# Patient Record
Sex: Female | Born: 1980 | Race: Black or African American | Hispanic: No | Marital: Single | Smoking: Never smoker
Health system: Southern US, Community
[De-identification: ages and names within clinical notes are randomized; demographics above are authoritative.]

## PROBLEM LIST (undated history)

## (undated) DIAGNOSIS — G4730 Sleep apnea, unspecified: Secondary | ICD-10-CM

## (undated) DIAGNOSIS — Z9889 Other specified postprocedural states: Secondary | ICD-10-CM

## (undated) DIAGNOSIS — I10 Essential (primary) hypertension: Secondary | ICD-10-CM

## (undated) DIAGNOSIS — E119 Type 2 diabetes mellitus without complications: Secondary | ICD-10-CM

## (undated) DIAGNOSIS — A599 Trichomoniasis, unspecified: Secondary | ICD-10-CM

## (undated) DIAGNOSIS — K219 Gastro-esophageal reflux disease without esophagitis: Secondary | ICD-10-CM

## (undated) DIAGNOSIS — D649 Anemia, unspecified: Secondary | ICD-10-CM

## (undated) DIAGNOSIS — F411 Generalized anxiety disorder: Secondary | ICD-10-CM

## (undated) DIAGNOSIS — F32A Depression, unspecified: Secondary | ICD-10-CM

## (undated) DIAGNOSIS — F329 Major depressive disorder, single episode, unspecified: Secondary | ICD-10-CM

## (undated) HISTORY — DX: Depression, unspecified: F32.A

## (undated) HISTORY — DX: Anemia, unspecified: D64.9

## (undated) HISTORY — DX: Type 2 diabetes mellitus without complications: E11.9

## (undated) HISTORY — DX: Major depressive disorder, single episode, unspecified: F32.9

## (undated) HISTORY — PX: ABLATION: SHX5711

## (undated) HISTORY — DX: Generalized anxiety disorder: F41.1

## (undated) HISTORY — DX: Trichomoniasis, unspecified: A59.9

---

## 2003-10-30 ENCOUNTER — Emergency Department (HOSPITAL_COMMUNITY): Admission: EM | Admit: 2003-10-30 | Discharge: 2003-10-30 | Payer: Self-pay | Admitting: Emergency Medicine

## 2003-12-31 ENCOUNTER — Ambulatory Visit (HOSPITAL_COMMUNITY): Admission: RE | Admit: 2003-12-31 | Discharge: 2003-12-31 | Payer: Self-pay | Admitting: *Deleted

## 2004-02-20 ENCOUNTER — Ambulatory Visit (HOSPITAL_COMMUNITY): Admission: RE | Admit: 2004-02-20 | Discharge: 2004-02-20 | Payer: Self-pay | Admitting: Obstetrics and Gynecology

## 2004-05-26 ENCOUNTER — Inpatient Hospital Stay (HOSPITAL_COMMUNITY): Admission: AD | Admit: 2004-05-26 | Discharge: 2004-05-29 | Payer: Self-pay | Admitting: Obstetrics and Gynecology

## 2004-05-30 ENCOUNTER — Emergency Department (HOSPITAL_COMMUNITY): Admission: EM | Admit: 2004-05-30 | Discharge: 2004-05-30 | Payer: Self-pay | Admitting: Emergency Medicine

## 2005-07-11 IMAGING — US US OB FOLLOW-UP
1 series · 13 of 28 positions shown · non-contrast
Comparison: none

CLINICAL DATA: Incomplete visualization of spinal cord on previous exam.

[Series 1: unknown · 0.32mm/px · 13 of 50 slices shown]
[im 2/50]
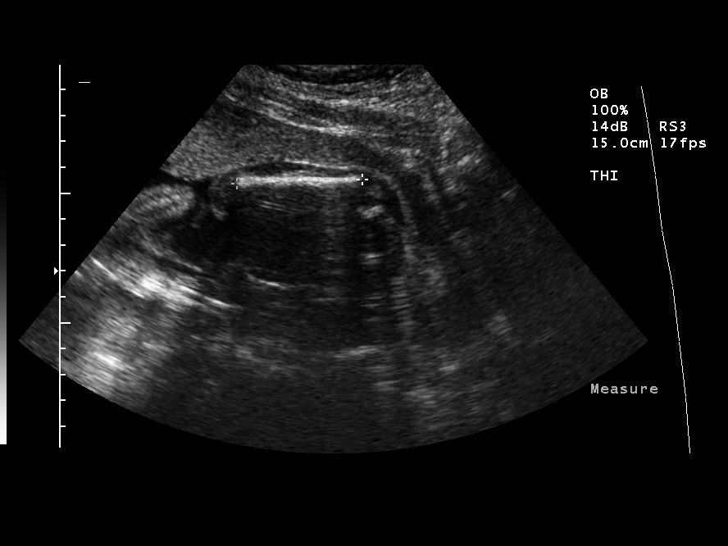
[im 6/50]
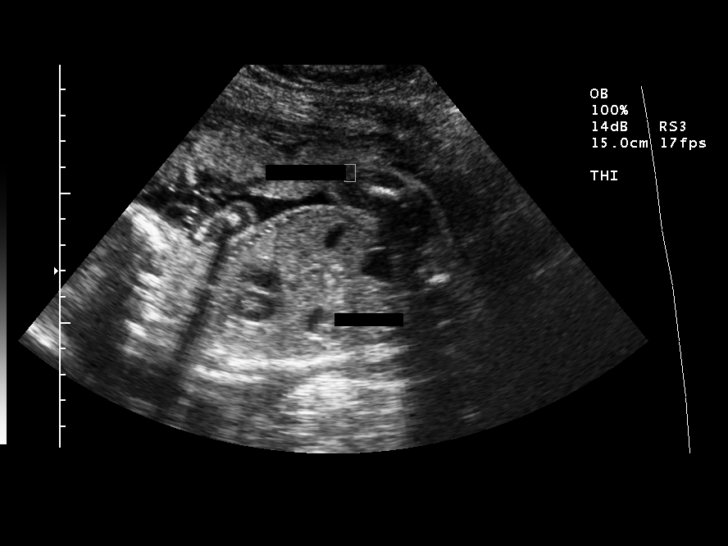
[im 10/50]
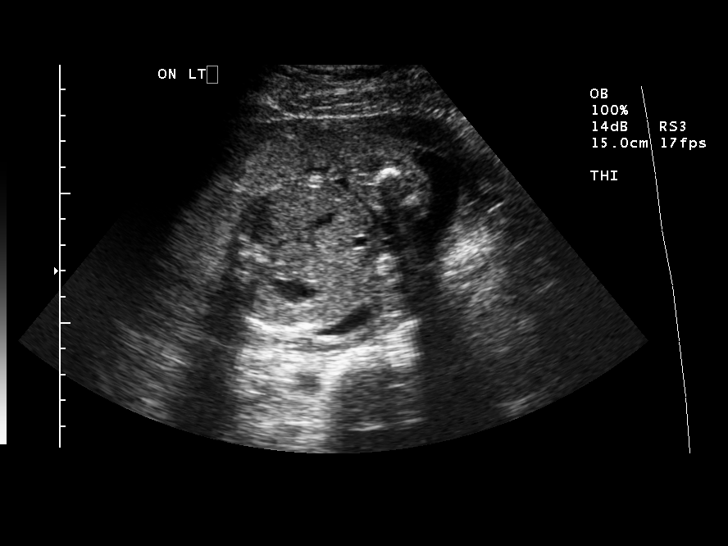
[im 13/50]
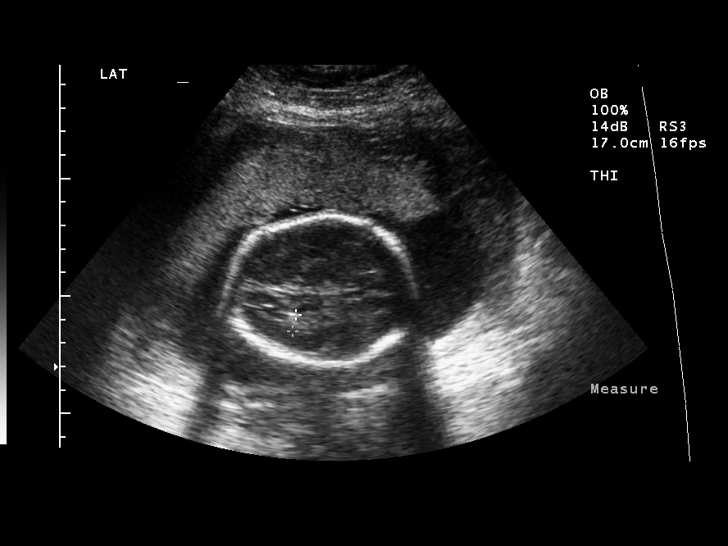
[im 17/50]
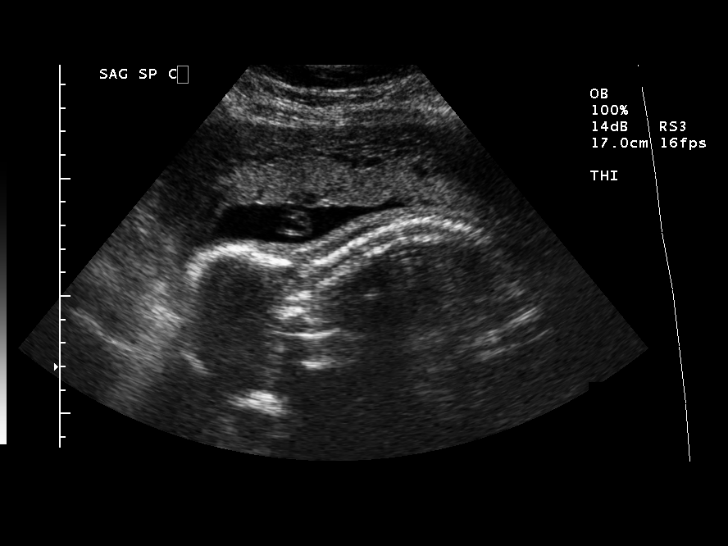
[im 20/50]
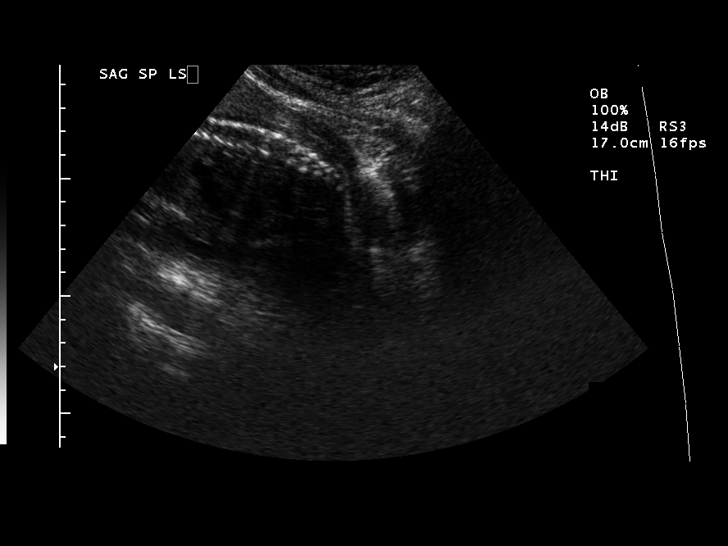
[im 26/50]
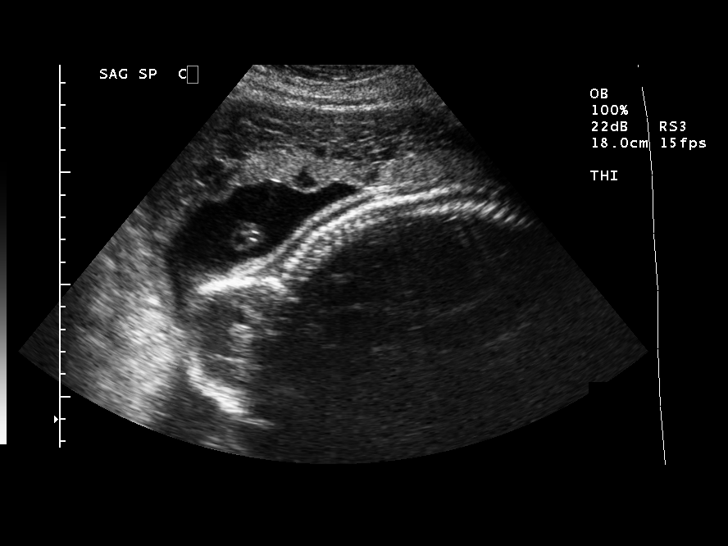
[im 30/50]
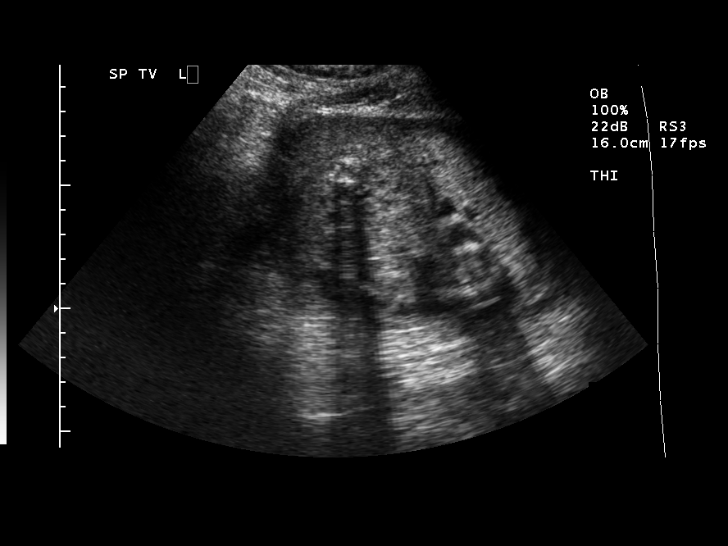
[im 33/50]
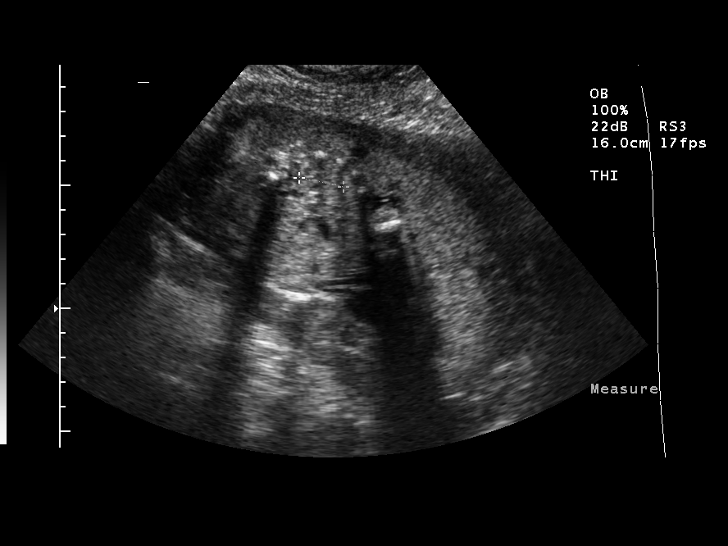
[im 37/50]
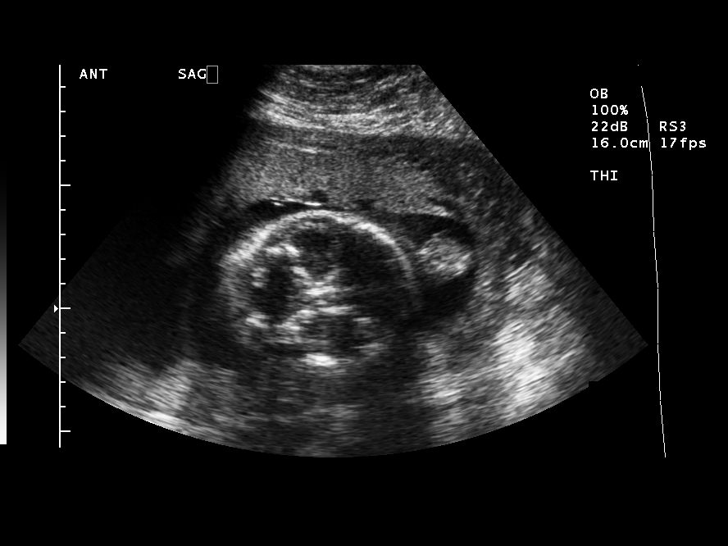
[im 40/50]
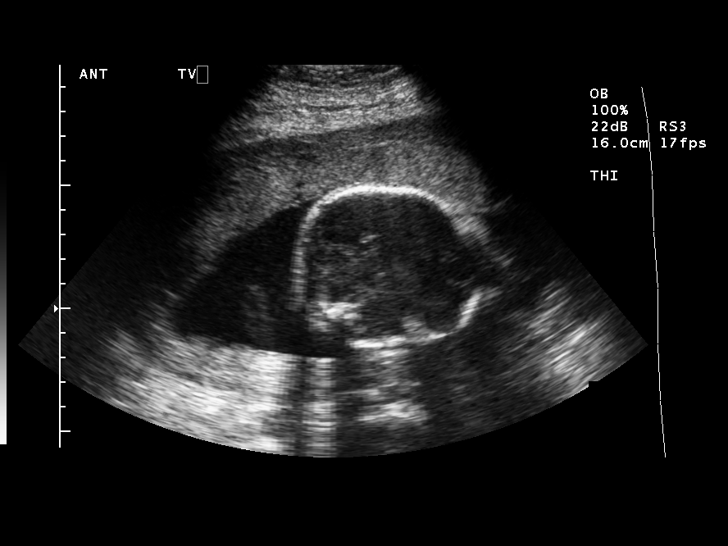
[im 44/50]
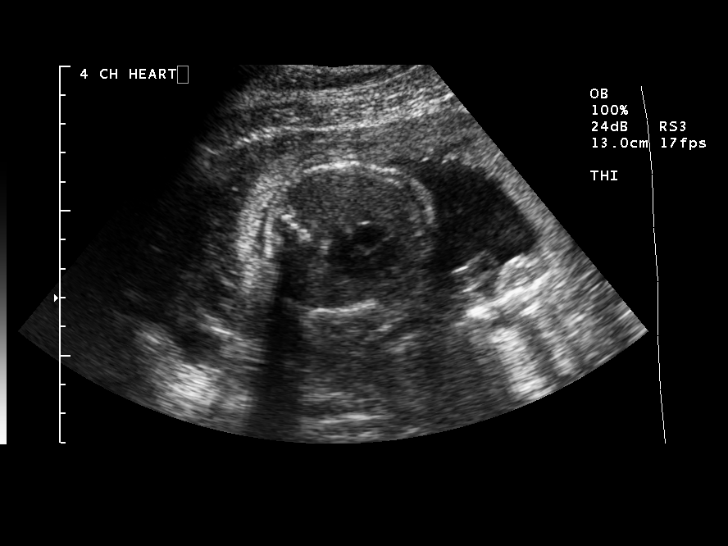
[im 48/50]
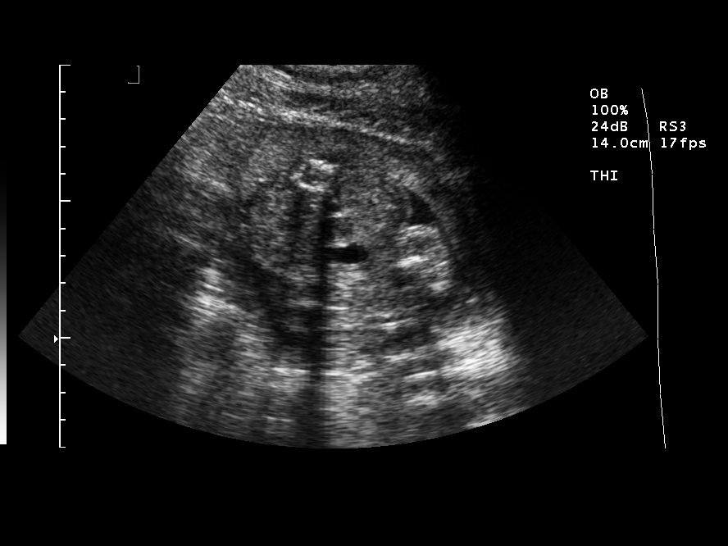

[13 of 28 positions shown; findings below may reference images not displayed]

OBSTETRICAL ULTRASOUND RE-EVALUATION
 Number of Fetuses:  Single
 Heart Rate:  155 beats per minute 
 Movement:  Yes
 Breathing:  No
 Presentation:  Breech
 Placental Location:  Anterior
 Grade:  I
 Previa:  No
 Amniotic Fluid (subjective):  Normal
 Amniotic Fluid (objective):  9.7 which is 5%ile and 72.1 which is 95%ile

 FETAL BIOMETRY
 BPD:  6.4 cm, 26 weeks 0 days
 HC:  23.7 cm, 25 weeks 5 days
 AC:  20.7 cm, 25 weeks 2 days
 FL:  4.8 cm, 25 weeks 6 days

 Mean GA:  25 weeks 5 days

 FETAL ANATOMY
 Lateral Ventricles:  Visualized (Previously seen)
 Thalami/CSP:  Previously seen 
 Posterior Fossa:  Previously seen 
 Nuchal Region:  Previously seen 
 Spine:  Visualized 
 4 Chamber Heart on Left:  Visualized (Previously seen)
 Stomach on Left:  Visualized (Previously seen)
 3 Vessel Cord:  Visualized (Previously seen)
 Cord Insertion Site:  Visualized (Previously seen)
 Kidneys:    Visualized (Previously seen)
 Bladder:  Visualized (Previously seen)
 Extremities:  Visualized (Previously seen)

 ADDITIONAL ANATOMY VISUALIZED:  Male genitalia

 MATERNAL FINDINGS
 Cervix:  3.6 cm
IMPRESSION: Single live intrauterine gestation measured at 25 weeks 5 days estimated gestational age.  This represents appropriate interval growth since previous ultrasound of 12/31/03.  Spine is now well visualized and normal appearance.  No morphologic abnormality seen.  Currently breech presentation.

## 2005-09-07 ENCOUNTER — Other Ambulatory Visit: Admission: RE | Admit: 2005-09-07 | Discharge: 2005-09-07 | Payer: Self-pay | Admitting: Obstetrics & Gynecology

## 2006-01-27 ENCOUNTER — Ambulatory Visit (HOSPITAL_COMMUNITY): Admission: RE | Admit: 2006-01-27 | Discharge: 2006-01-27 | Payer: Self-pay | Admitting: Family Medicine

## 2006-06-08 ENCOUNTER — Emergency Department (HOSPITAL_COMMUNITY): Admission: EM | Admit: 2006-06-08 | Discharge: 2006-06-08 | Payer: Self-pay | Admitting: Emergency Medicine

## 2006-10-17 ENCOUNTER — Ambulatory Visit: Admission: RE | Admit: 2006-10-17 | Discharge: 2006-10-17 | Payer: Self-pay | Admitting: Obstetrics & Gynecology

## 2006-11-02 ENCOUNTER — Ambulatory Visit: Payer: Self-pay | Admitting: Pulmonary Disease

## 2006-12-01 ENCOUNTER — Ambulatory Visit (HOSPITAL_COMMUNITY): Admission: RE | Admit: 2006-12-01 | Discharge: 2006-12-01 | Payer: Self-pay | Admitting: Obstetrics & Gynecology

## 2007-04-19 ENCOUNTER — Emergency Department (HOSPITAL_COMMUNITY): Admission: EM | Admit: 2007-04-19 | Discharge: 2007-04-20 | Payer: Self-pay | Admitting: Emergency Medicine

## 2007-06-18 IMAGING — CR DG CHEST 2V
2 series · 2 of 2 positions shown · non-contrast
Comparison: none

CLINICAL DATA: Shortness of breath, hypertension. 
 CHEST - 2 VIEW ? 01/27/06:

[view not recorded (1 of 2)]
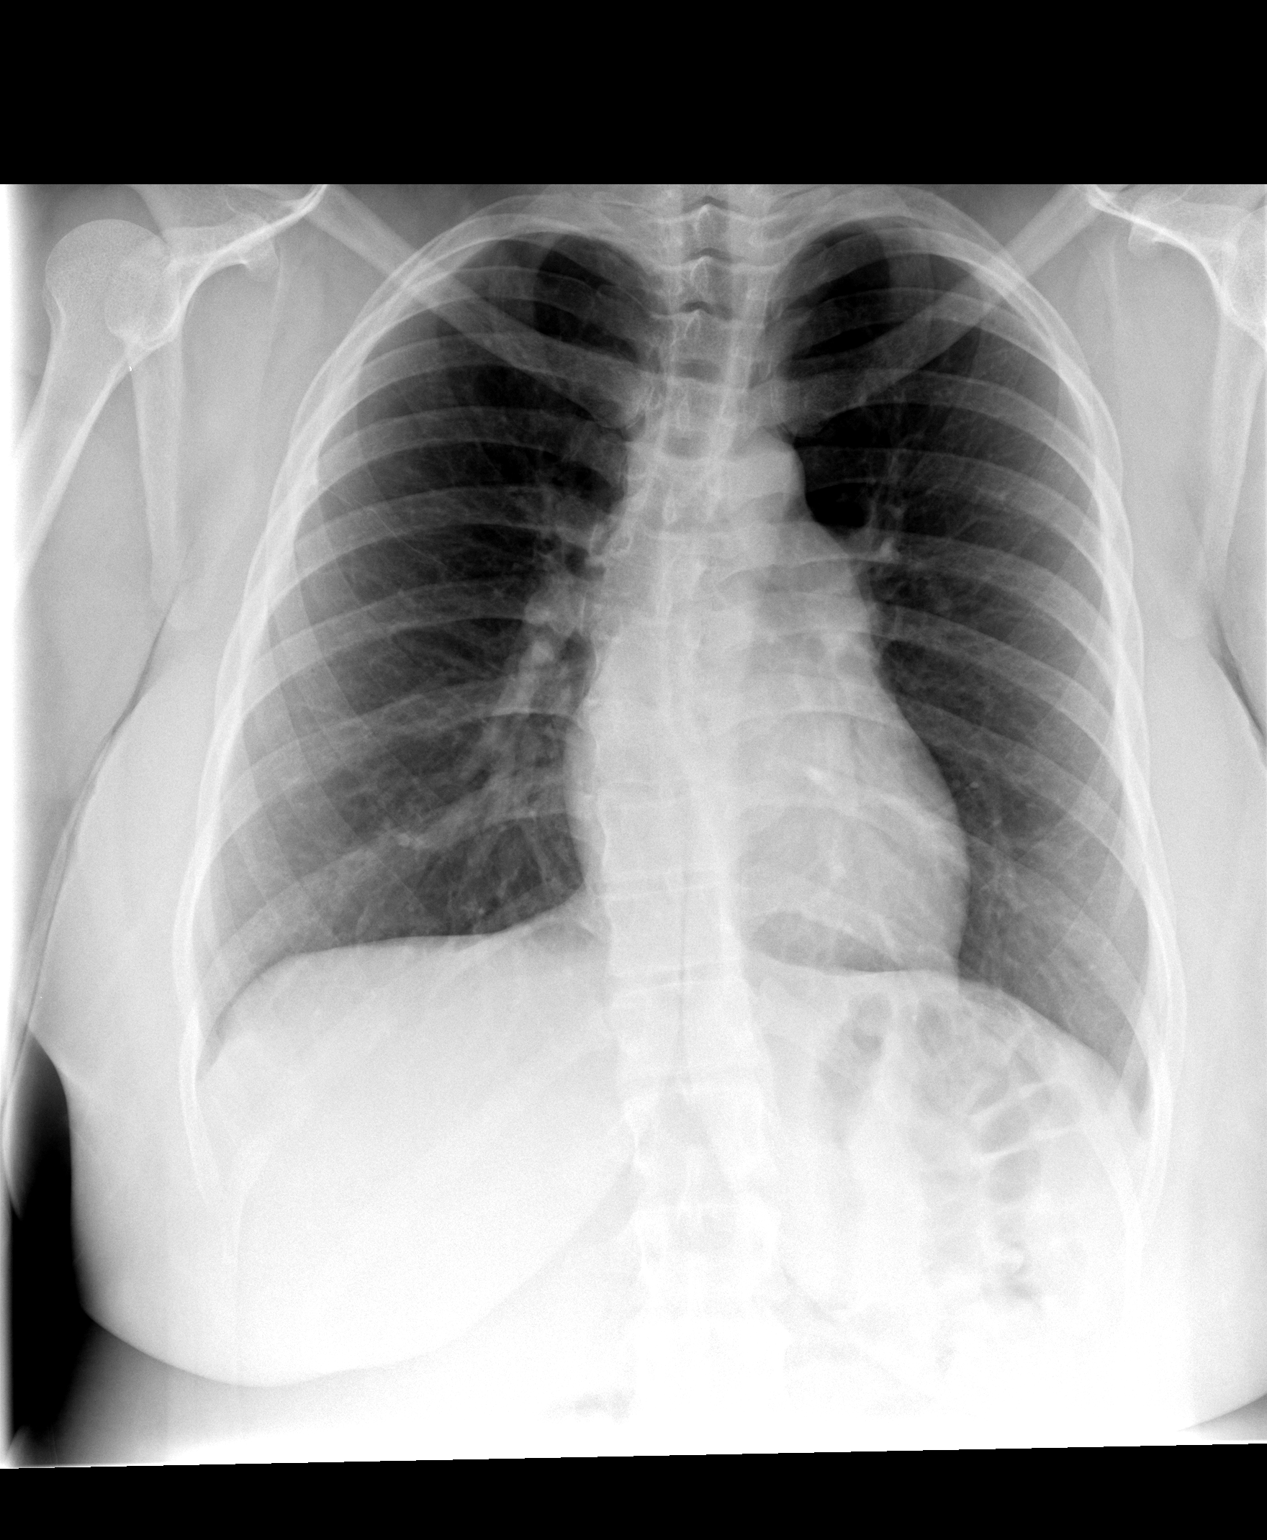

[view not recorded (2 of 2)]
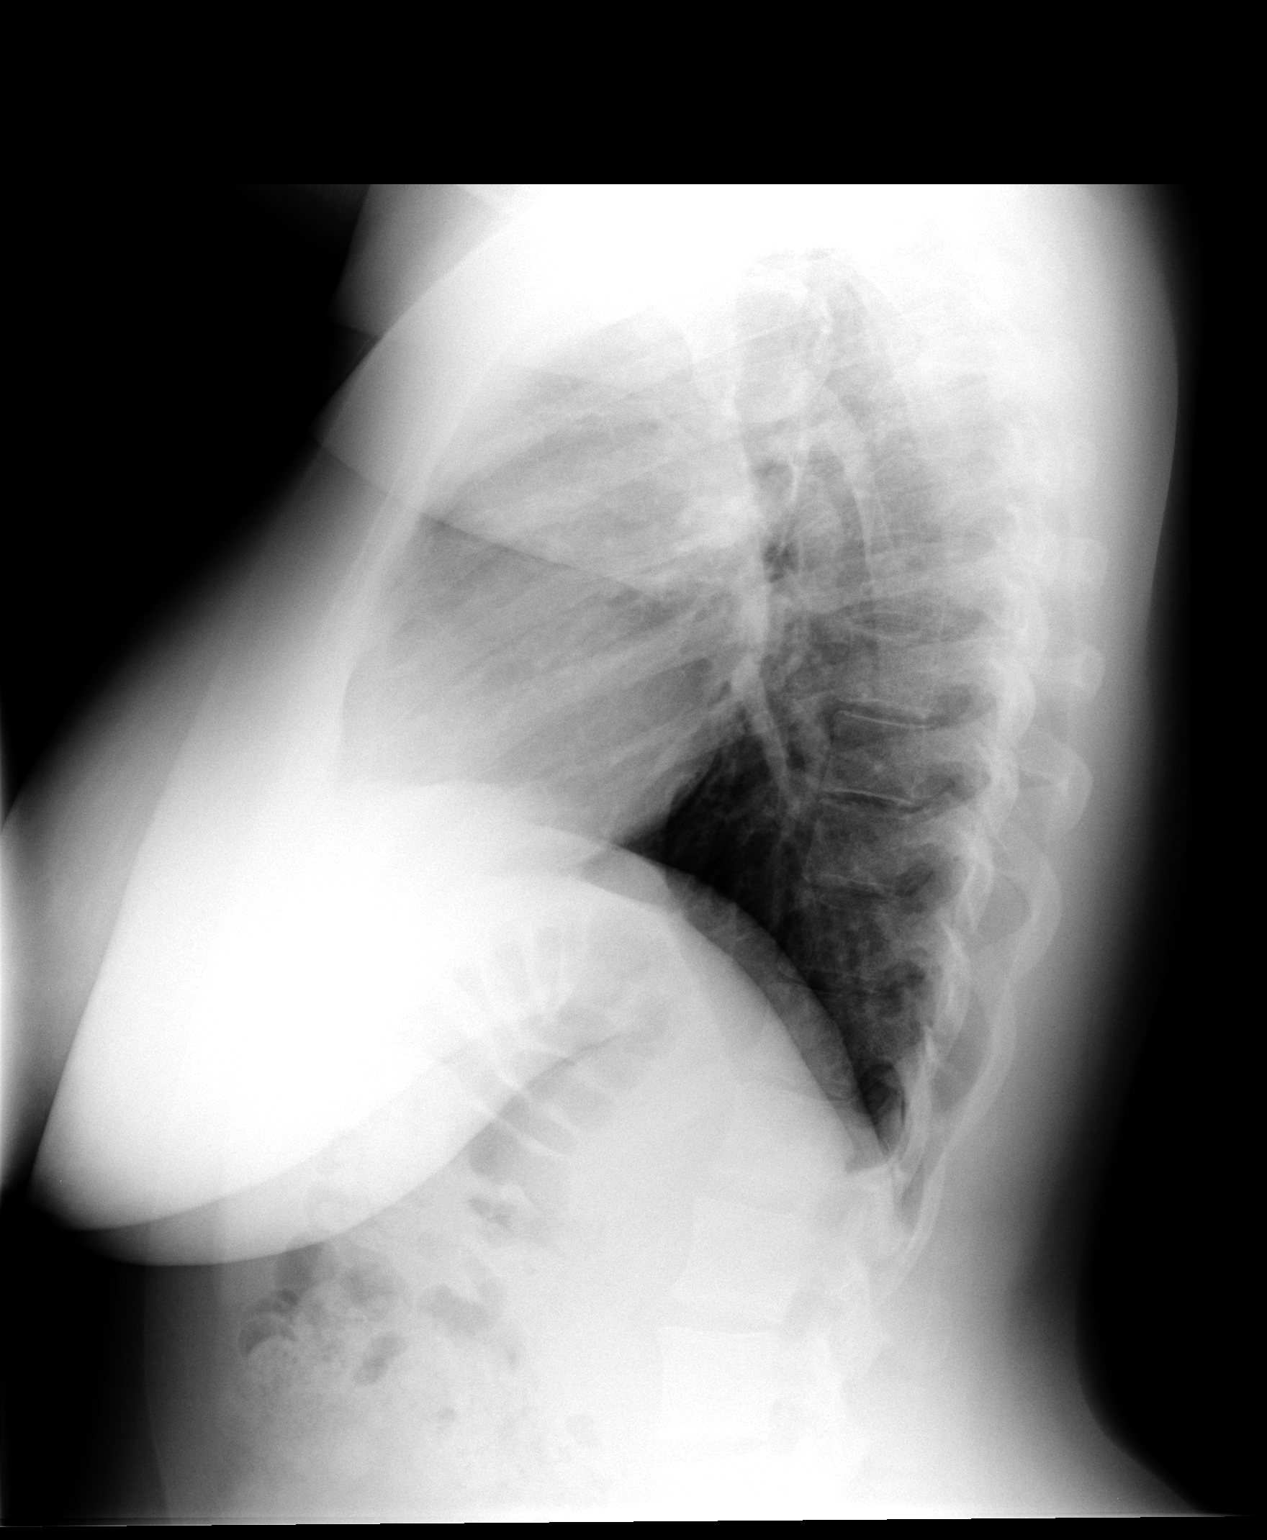

[2 of 2 positions shown; findings below may reference images not displayed]

FINDINGS: The lungs are clear.   There is no effusion. Heart size is normal.  No focal bony abnormality.  Some mild thoracolumbar scoliosis is noted.
IMPRESSION: No acute disease.

## 2007-09-19 ENCOUNTER — Emergency Department (HOSPITAL_COMMUNITY): Admission: EM | Admit: 2007-09-19 | Discharge: 2007-09-19 | Payer: Self-pay | Admitting: Emergency Medicine

## 2008-05-08 ENCOUNTER — Ambulatory Visit (HOSPITAL_BASED_OUTPATIENT_CLINIC_OR_DEPARTMENT_OTHER): Admission: RE | Admit: 2008-05-08 | Discharge: 2008-05-08 | Payer: Self-pay | Admitting: *Deleted

## 2009-05-17 ENCOUNTER — Emergency Department (HOSPITAL_COMMUNITY): Admission: EM | Admit: 2009-05-17 | Discharge: 2009-05-17 | Payer: Self-pay | Admitting: Emergency Medicine

## 2010-03-21 ENCOUNTER — Emergency Department (HOSPITAL_COMMUNITY): Admission: EM | Admit: 2010-03-21 | Discharge: 2010-03-21 | Payer: Self-pay | Admitting: Emergency Medicine

## 2011-01-27 ENCOUNTER — Emergency Department (HOSPITAL_COMMUNITY)
Admission: EM | Admit: 2011-01-27 | Discharge: 2011-01-27 | Disposition: A | Discharge status: Home / Self Care | Payer: Medicaid Other | Attending: Emergency Medicine | Admitting: Emergency Medicine

## 2011-01-27 DIAGNOSIS — R070 Pain in throat: Secondary | ICD-10-CM | POA: Insufficient documentation

## 2011-01-27 DIAGNOSIS — J039 Acute tonsillitis, unspecified: Secondary | ICD-10-CM | POA: Insufficient documentation

## 2011-04-07 NOTE — Op Note (Signed)
Debra Tanner, Debra Tanner               ACCOUNT NO.:  [id number]   MEDICAL RECORD NO.:  [id number]          PATIENT TYPE:  AMB   LOCATION:  DSC                          FACILITY:  [facility]   PHYSICIAN:  Viann Shove, MDDATE OF BIRTH:  [birth year]   DATE OF PROCEDURE:  05/08/2008  DATE OF DISCHARGE:                               OPERATIVE REPORT   PREOPERATIVE DIAGNOSIS:  Right esotropia.   POSTOPERATIVE DIAGNOSIS:  Right esotropia.   PROCEDURE:  5.5-mm right medial rectus recession.   SURGEON:  Viann Shove, MD   ANESTHESIA:  General with laryngeal mass.   COMPLICATIONS:  None.   PROCEDURE:  After adequate general anesthesia was achieved, the patient  was prepped and draped in the usual sterile ophthalmic manner.  A lid  speculum was placed between the lids of the right eye.  Forced ductions  were carried out, which were negative. An incision was made through  conjunctiva and Tenon's capsule at 1 o'clock at the limbus and extended  supranasally.  A limbal peritomy was carried out clockwise between 1  o'clock and 5 o'clock.  The 5 o'clock incision was extended  infranasally.  The right medial rectus muscle was isolated on muscle.  Check ligaments and intermuscular septum were divided from the muscle.  A 6-0 double-arm Vicryl suture was passed through the muscle at its  insertion, and locked at each end.  The muscle was cut away from globe  at its insertion and bleeding episcleral vessels cauterized.  The muscle  was recessed 5.5 mm posterior to the original insertion.  The sutures  were passed through scleral tunnels at that point, and the muscle tied  at that point with a surgeon's knot.  Conjunctiva was closed at the  limbus using interrupted 7-0 chromic sutures.  The lid speculum was  removed from the right eye.  Bacitracin ointment was placed between lids  of the right eye.  A semi-pressure dressing was applied to the right  eye.  The patient was awaken and taken to  the recovery room in good  condition.      Viann Shove, MD  Electronically Signed     WGM/MEDQ  D:  05/08/2008  T:  05/09/2008  Job:  [id number]

## 2011-04-10 NOTE — Procedures (Signed)
Debra Tanner, Debra Tanner               ACCOUNT NO.:  [id number]   MEDICAL RECORD NO.:  [id number]          PATIENT TYPE:  OUT   LOCATION:  SLEEP LAB                     FACILITY:  [facility]   PHYSICIAN:  Barbaraann Share, MD,FCCPDATE OF BIRTH:  06[birth year]   DATE OF STUDY:  10/17/2006                            NOCTURNAL POLYSOMNOGRAM   REFERRING PHYSICIAN:  Duane Lope MD   INDICATION FOR THE STUDY:  Hypersomnia with sleep apnea.   EPWORTH SCORE:  11.   SLEEP ARCHITECTURE:  The patient had a total sleep time of 342 minutes  with adequate slow wave sleep as well as REM.  Sleep onset latency was  normal and REM onset was very prolonged at 147 minutes.  Sleep  efficiency was mildly decreased at 84%.   RESPIRATORY DATA:  The patient underwent split night protocol where  there was found 154 obstructive events in the first 107 minutes of  sleep.  This gave the patient a respiratory disturbance index of 86  events per hour over the first half of the night.  The events were not  positional, but there was very loud snoring noted throughout.  By  protocol, the patient was then placed on a Respironics small/medium  comfort light nasal mask and ultimately titrated to a final pressure of  8 cm of water pressure with excellent control of events, even through  supine REM.   OXYGEN DATA:  The patient had O2 desaturation as low as 79% with her  obstructive events.   CARDIAC DATA:  No clinically significant cardiac arrhythmias were noted.   MOVEMENT/PARASOMNIA:  Small numbers of leg jerks that were not  clinically significant.   IMPRESSION/RECOMMENDATION:  Split night study reveals severe obstructive  sleep apnea/hypopnea syndrome with a respiratory disturbance index of 86  events per hour during the first half of the night and O2 desaturation  as low as 79%.  The patient was then placed on CPAP with a Respironics  small/medium comfort light nasal mask and titrated to a final pressure  of 8 cm of  water.  The patient should also be encouraged to work on  weight loss if clinically indicated.     Barbaraann Share, MD,FCCP  Diplomate, American Board of Sleep  Medicine    [facility]/MEDQ  D:  11/02/2006 14:23:21  T:  11/02/2006 16:01:15  Job:  [id number]

## 2011-04-10 NOTE — H&P (Signed)
Debra Tanner, GUPTA                          ACCOUNT NO.:  [id number]   MEDICAL RECORD NO.:  [id number]                  PATIENT TYPE:   LOCATION:                                       FACILITY:   PHYSICIAN:  Tilda Burrow, M.D.              DATE OF BIRTH:  [birth year]   DATE OF ADMISSION:  05/26/2004  DATE OF DISCHARGE:                                HISTORY & PHYSICAL   ADMISSION DIAGNOSES:  1. Pregnancy at 39 weeks and 1 day.  2. Elective induction of labor.  3. Cervical favorability.  4. Small pelvis with borderline pelvic diameters.   HISTORY OF PRESENT ILLNESS:  This is a 30 year old female, 4 feet 11 inches,  gravida 2, para 0, AB 1, LMP August 26, 2003, placing [facility] July 10,with  corresponding second trimester ultrasound.  She is admitted at 39 weeks for  induction of labor. She has been contracting off and on for the past few  days when seen May 19, 2004.  Her cervix is 1+ cm, soft, -2, vertex  presentation.  The patient has rather small bone structure, and the  presenting part is not at the end of the pelvis while the cervix is  cooperating, tissues are softening.  Possibility of bony dystocia is an  option.  Fundal height has remained at 35 cm last three visits, so induction  is indicated, optimizes chances at trial of labor.  She has been made aware  of the possibility of cesarean delivery is higher in someone of her stature.   PAST MEDICAL HISTORY:  Benign.   PAST SURGICAL HISTORY:  Negative.   ALLERGIES:  No known drug allergies.   SOCIAL HISTORY:  Stable relationship, current partner x 6 years.   FAMILY HISTORY:  Positive for hypertension.   PRENATAL COURSE:  Blood type A positive.  GC and chlamydia both positive  early pregnancy with repeat GC and chlamydia negative in January.  Repeat GC  and chlamydia negative April 28, 2004.  Group B strep, however, was positive.  Prenatal labs include RPR nonreactive.  HIV negative.  Hepatitis negative.  Rubella  immune is present.  Hemoglobin 12, hematocrit 38.  Antibody screen  negative.   PHYSICAL EXAMINATION:  Height 4 feet 11 inches, weight 164 pounds.  Blood  pressure 118/82.  Urinalysis negative for protein.  Fundal height 35 cm,  vertex presentation, cervix 1+ cm, soft, -2, vertex.   PLAN:  Dilation Monday night, July 4, with Pitocin and probable trial of  labor.  Uncertain prognosis for vaginal delivery.     ___________________________________________                                         Tilda Burrow, M.D.   JVF/MEDQ  D:  05/19/2004  T:  05/19/2004  Job:  [id number]  cc:   Francoise Schaumann. Halm, D.O.  [address]  [place]  [place] [address]  Fax: [phone]

## 2011-04-10 NOTE — Op Note (Signed)
Debra Tanner, Debra Tanner                         ACCOUNT NO.:  [id number]   MEDICAL RECORD NO.:  [id number]                   PATIENT TYPE:  INP   LOCATION:  LDR2                                 FACILITY:  [facility]   PHYSICIAN:  Tilda Burrow, M.D.              DATE OF BIRTH:  Nov 19, 1981   DATE OF PROCEDURE:  05/27/2004  DATE OF DISCHARGE:                                 OPERATIVE REPORT   PROCEDURE:  Epidural catheter placement at 9 a.m. on July 5th.   INDICATIONS FOR PROCEDURE:  Elective request of epidural in early labor with  cervix 4 cm dilated.   DESCRIPTION OF PROCEDURE:  The patient was placed in the sitting position.  The back was palpated and prepped and draped, with the L2-3 interspace  identified by palpation of the back and then the epidural space identified  with Tuohy needle using loss of resistance technique at a depth of  approximately 3.5 cm.  The catheter was inserted after a 5 cc test dose of  1.5% Xylocaine with epinephrine.  The catheter was inserted 3.5 to 4 cm into  the epidural space, taped to the back after removal of the needle, and  placed on continuous infusion with a 7 cc bolus followed by 12 cc per hour.  The patient tolerated the procedure very nicely, with analgesic effect,  still sating up but symmetric and without adverse effects.  The patient's  prognosis for vaginal delivery remains uncertain.      ___________________________________________                                            Tilda Burrow, M.D.   JVF/MEDQ  D:  05/27/2004  T:  05/27/2004  Job:  [id number]

## 2011-04-10 NOTE — Op Note (Signed)
[name], Debra Tanner               ACCOUNT NO.:  [id number]   MEDICAL RECORD NO.:  [id number]          PATIENT TYPE:  AMB   LOCATION:  DAY                           FACILITY:  [facility]   PHYSICIAN:  Lazaro Arms, M.D.   DATE OF BIRTH:  1981/05/03   DATE OF PROCEDURE:  12/01/2006  DATE OF DISCHARGE:                               OPERATIVE REPORT   PREOPERATIVE DIAGNOSIS:  High-grade dysplasia of the cervix by cervical  biopsy and colposcopy in the office.   POSTOPERATIVE DIAGNOSIS:  High-grade dysplasia of the cervix by cervical  biopsy and colposcopy in the office.   PROCEDURE:  Laser ablation of the cervix.   SURGEON:  Eure.   ANESTHESIA:  General endotracheal.   FINDINGS:  The patient had atypical squamous cell Pap smear but could  not rule out high-grade dysplasia from the office. Did a colposcopy with  directed biopsy which returned as moderate- to high-grade dysplasia. It  was adequate. As a result, decided to proceed with laser ablation of the  cervix today. Colposcopy was performed prior to laser. Using 3% acetic  acid, the lesion was confirmed as before.   DESCRIPTION OF OPERATION:  The patient was taken to the operating room,  placed in the supine position, under general endotracheal anesthesia,  placed in the dorsal lithotomy position. Grave's speculum was placed.  Three percent acetic acid was used on the cervix. Colposcopy was  performed and again confirmed the high-grade dysplasia. The entire  squamocolumnar junction was identified. Holmium laser was used at 20  repeats per second and a power of 1.5. I ablated the cervix, getting a  margin around the squamocolumnar junction down to 5 to 7 mm laterally,  coning down to 7 to 9 mm centrally. All of the tissue was hemostatic.  Monsel's was placed on top to ensure postoperative hemostasis. The  patient tolerated the procedure well. She experienced no blood loss and  was taken to the recovery room in good stable  condition. All counts were  correct.      Lazaro Arms, M.D.  Electronically Signed     LHE/MEDQ  D:  12/01/2006  T:  12/01/2006  Job:  [id number]

## 2011-04-10 NOTE — Op Note (Signed)
Debra Tanner, Debra Tanner                         ACCOUNT NO.:  [id number]   MEDICAL RECORD NO.:  [id number]                   PATIENT TYPE:  INP   LOCATION:  LDR2                                 FACILITY:  [facility]   PHYSICIAN:  Tilda Burrow, M.D.              DATE OF BIRTH:  16-Apr-1981   DATE OF PROCEDURE:  05/27/2004  DATE OF DISCHARGE:                                 OPERATIVE REPORT   PROCEDURE:  Vaginal delivery.   LABOR SUMMARY:  The patient progressed and was found to be progressed slowly  with 8 cm dilated at 2 p.m. and completely dilated at 3 p.m.  Vertex was at  approximately 0 station.  The patient began to push. Second stage was  accompanied by significant decelerations of fetal heart rate into the 90's  with good beat-to-beat variability and gradual recovery to baseline.  The  decelerations were recurrent.  She made steady progress and delivered after  reaching the right occiput anterior position at an outlet station with  vertex well applied to the hymen remnants.  With pushing, you could see  approximately 1.5 cm of vertex between the labia without any abduction of  the labia manually.  Foley catheter was removed.  Kiwi vacuum extractor  explained to the family and reviewed including specific mention of the risk  of shoulder dystocia and long-term injury to the arm with the risk of 2 in  4000.  Risk of Erb's  palsy reviewed with the patient.  She acknowledged  understanding and acceptance of this and desired [place] to proceed.  Vacuum  assistance was used through two contractions and she pushed the baby  beautifully with only slight assistance, rotating the baby the rest of the  way to direct occiput anterior position and delivery over an intact perineum  with only first-degree lacerations.  The baby's left shoulder was impacted  beneath the symphysis pubis.  Mild dystocia was dealt with beautifully by  identifying the right shoulder and its posterior position and performing  a  counterclockwise corkscrew maneuver using the axilla as completely for  traction.  I rotated the baby easily and delivered the right arm first and  then delivered the rest of the baby without difficulty.  Infant did well and  was taken to the warmer, stimulated and cried spontaneously with Apgars  assigned by nursing.  Weight is pending.  Cord blood gases were obtained and  are pending at the time of this dictation.  Placenta delivered easily,  __________ presentation.  Intact three-vessel cord confirmed blood gases  obtained as well as routine labs.   Perineal lacerations were only first degree.  The patient showed appropriate  interest in the baby.      ___________________________________________  Tilda Burrow, M.D.   JVF/MEDQ  D:  05/27/2004  T:  05/27/2004  Job:  [id number]

## 2011-04-12 ENCOUNTER — Emergency Department (HOSPITAL_COMMUNITY)
Admission: EM | Admit: 2011-04-12 | Discharge: 2011-04-12 | Disposition: A | Discharge status: Home / Self Care | Payer: Medicaid Other | Attending: Emergency Medicine | Admitting: Emergency Medicine

## 2011-04-12 DIAGNOSIS — I10 Essential (primary) hypertension: Secondary | ICD-10-CM | POA: Insufficient documentation

## 2011-04-12 DIAGNOSIS — K219 Gastro-esophageal reflux disease without esophagitis: Secondary | ICD-10-CM | POA: Insufficient documentation

## 2011-08-07 ENCOUNTER — Ambulatory Visit: Payer: Medicaid Other | Attending: Neurology | Admitting: Sleep Medicine

## 2011-08-07 DIAGNOSIS — G4733 Obstructive sleep apnea (adult) (pediatric): Secondary | ICD-10-CM | POA: Insufficient documentation

## 2011-08-07 DIAGNOSIS — Z6833 Body mass index (BMI) 33.0-33.9, adult: Secondary | ICD-10-CM | POA: Insufficient documentation

## 2011-08-07 DIAGNOSIS — G4730 Sleep apnea, unspecified: Secondary | ICD-10-CM

## 2011-08-10 NOTE — Procedures (Signed)
Debra Tanner, Debra Tanner               ACCOUNT NO.:  [id number]  MEDICAL RECORD NO.:  [id number]          PATIENT TYPE:  OUT  LOCATION:  SLEEP LAB                     FACILITY:  [facility]  PHYSICIAN:  Ajax Schroll A. Gerilyn Pilgrim, M.D. DATE OF BIRTH:  1980/12/25  DATE OF STUDY:  08/07/2011                           NOCTURNAL POLYSOMNOGRAM  REFERRING PHYSICIAN:  Alexiana Laverdure A. Gerilyn Pilgrim, M.D.  REFERRING PHYSICIAN:  Robin Pafford A. Gerilyn Pilgrim, MD  INDICATION FOR STUDY:  This is a 30 year old who presents with hypersomnia, fatigue, and snoring.  This study has been done to evaluate for obstructive sleep apnea syndrome.  EPWORTH SLEEPINESS SCORE: 1. BMI 33.  MEDICATIONS:  Pepcid.  SLEEP ARCHITECTURE:  Architectural summary:  This is a split night recording with the initial portion being a diagnostic and the second portion a titration study.  Total recording time is 411 minutes.  Sleep efficiency 83%.  Sleep latency 10 minutes.  REM latency sleep 220 minutes.  RESPIRATORY DATA:  Respiratory summary:  Baseline oxygen saturation 98, lowest saturation 82 during nonREM sleep.  Diagnostic AHI is 98 with most of the events being hypopneas.  The patient was subsequently started on positive pressure between 5 and 8, optimal pressure of 8 with resolution of events and snoring.  She did tolerate the optimal pressure well.  CARDIAC DATA:  Electrocardiogram summary:  Average heart rate is 76 with no significant dysrhythmias observed.  MOVEMENT-PARASOMNIA:  Limb movement summary:  PLM index is 0.  IMPRESSIONS-RECOMMENDATIONS:  Severe obstructive sleep apnea syndrome which responds well to a CPAP of 8.     Gad Aymond A. Gerilyn Pilgrim, M.D.    KAD/MEDQ  D:  08/10/2011 08:50:23  T:  08/10/2011 09:48:39  Job:  [id number]

## 2012-06-01 ENCOUNTER — Emergency Department (HOSPITAL_COMMUNITY)
Admission: EM | Admit: 2012-06-01 | Discharge: 2012-06-01 | Disposition: A | Discharge status: Home / Self Care | Payer: Medicaid Other | Attending: Emergency Medicine | Admitting: Emergency Medicine

## 2012-06-01 ENCOUNTER — Encounter (HOSPITAL_COMMUNITY): Payer: Self-pay | Admitting: *Deleted

## 2012-06-01 DIAGNOSIS — Z711 Person with feared health complaint in whom no diagnosis is made: Secondary | ICD-10-CM

## 2012-06-01 DIAGNOSIS — J392 Other diseases of pharynx: Secondary | ICD-10-CM | POA: Insufficient documentation

## 2012-06-01 HISTORY — DX: Sleep apnea, unspecified: G47.30

## 2012-06-01 HISTORY — DX: Essential (primary) hypertension: I10

## 2012-06-01 LAB — RAPID STREP SCREEN (MED CTR MEBANE ONLY): Streptococcus, Group A Screen (Direct): NEGATIVE

## 2012-06-01 NOTE — ED Provider Notes (Signed)
History     CSN: [id number]  Arrival date & time 06/01/12  1156   First MD Initiated Contact with Patient 06/01/12 1226      Chief Complaint  Patient presents with  . Sore Throat    (Consider location/radiation/quality/duration/timing/severity/associated sxs/prior treatment) HPI Comments: Pt states her boyfriend has been dx with strep throat.  She checked in so she could be checked even though she has no sxs whatsoever.  Denies sore throat, fever/chills, headache or myalgias.  She preferred actually being tested for strep vs empiric tx.  Patient is a 31 y.o. female presenting with pharyngitis. The history is provided by the patient. No language interpreter was used.  Sore Throat Pertinent negatives include no chills, fever, headaches, myalgias or sore throat.    Past Medical History  Diagnosis Date  . Hypertension   . Sleep apnea     History reviewed. No pertinent past surgical history.  History reviewed. No pertinent family history.  History  Substance Use Topics  . Smoking status: Never Smoker   . Smokeless tobacco: Not on file  . Alcohol Use: No    OB History    Grav Para Term Preterm Abortions TAB SAB Ect Mult Living                  Review of Systems  Constitutional: Negative for fever and chills.  HENT: Negative for sore throat.   Musculoskeletal: Negative for myalgias.  Neurological: Negative for headaches.  All other systems reviewed and are negative.    Allergies  Review of patient's allergies indicates no known allergies.  Home Medications  No current outpatient prescriptions on file.  BP 132/89  Pulse 65  Temp 98.6 F (37 C) (Oral)  Resp 20  Ht 4\' 11"  (1.499 m)  Wt 163 lb (73.936 kg)  BMI 32.92 kg/m2  SpO2 99%  LMP 05/12/2012  Physical Exam  Nursing note and vitals reviewed. Constitutional: She is oriented to person, place, and time. She appears well-developed and well-nourished. No distress.  HENT:  Head: Normocephalic and  atraumatic.  Mouth/Throat: Uvula is midline, oropharynx is clear and moist and mucous membranes are normal. No uvula swelling. No oropharyngeal exudate, posterior oropharyngeal edema, posterior oropharyngeal erythema or tonsillar abscesses.  Eyes: EOM are normal.  Neck: Normal range of motion.  Cardiovascular: Normal rate, regular rhythm and normal heart sounds.   Pulmonary/Chest: Effort normal and breath sounds normal.  Abdominal: Soft. She exhibits no distension. There is no tenderness.  Musculoskeletal: Normal range of motion.  Lymphadenopathy:    She has no cervical adenopathy.  Neurological: She is alert and oriented to person, place, and time.  Skin: Skin is warm and dry.  Psychiatric: She has a normal mood and affect. Judgment normal.    ED Course  Procedures (including critical care time)   Labs Reviewed  RAPID STREP SCREEN  LAB REPORT - SCANNED   No results found.   1. Worried well       MDM          Evalina Field, PA 06/14/12 [id number]

## 2012-06-01 NOTE — ED Notes (Signed)
Pt says her boyfriend saw white spot on here throat.

## 2012-06-14 NOTE — ED Provider Notes (Signed)
Medical screening examination/treatment/procedure(s) were performed by non-physician practitioner and as supervising physician I was immediately available for consultation/collaboration.   Shelda Jakes, MD 06/14/12 2206

## 2013-01-15 ENCOUNTER — Emergency Department (HOSPITAL_COMMUNITY)
Admission: EM | Admit: 2013-01-15 | Discharge: 2013-01-15 | Disposition: A | Discharge status: Home / Self Care | Payer: Medicaid Other | Attending: Emergency Medicine | Admitting: Emergency Medicine

## 2013-01-15 ENCOUNTER — Encounter (HOSPITAL_COMMUNITY): Payer: Self-pay

## 2013-01-15 DIAGNOSIS — N949 Unspecified condition associated with female genital organs and menstrual cycle: Secondary | ICD-10-CM | POA: Insufficient documentation

## 2013-01-15 DIAGNOSIS — R51 Headache: Secondary | ICD-10-CM | POA: Insufficient documentation

## 2013-01-15 DIAGNOSIS — N898 Other specified noninflammatory disorders of vagina: Secondary | ICD-10-CM | POA: Insufficient documentation

## 2013-01-15 DIAGNOSIS — I10 Essential (primary) hypertension: Secondary | ICD-10-CM | POA: Insufficient documentation

## 2013-01-15 DIAGNOSIS — Z3202 Encounter for pregnancy test, result negative: Secondary | ICD-10-CM | POA: Insufficient documentation

## 2013-01-15 LAB — URINALYSIS, ROUTINE W REFLEX MICROSCOPIC
Bilirubin Urine: NEGATIVE
Ketones, ur: NEGATIVE mg/dL
Nitrite: NEGATIVE
Specific Gravity, Urine: >1.03 — ABNORMAL HIGH (ref 1.005–1.030)
pH: 5.5 (ref 5.0–8.0)

## 2013-01-15 LAB — URINE MICROSCOPIC-ADD ON

## 2013-01-15 MED ORDER — OXYCODONE-ACETAMINOPHEN 5-325 MG PO TABS: 1.0000 | ORAL_TABLET | ORAL | Status: DC | PRN

## 2013-01-15 MED ORDER — ONDANSETRON 8 MG PO TBDP
8.0000 mg | ORAL_TABLET | Freq: Once | ORAL | Status: AC
  Administered 2013-01-15: 8 mg via ORAL
  Filled 2013-01-15: qty 1

## 2013-01-15 MED ORDER — IBUPROFEN 800 MG PO TABS
800.0000 mg | ORAL_TABLET | Freq: Once | ORAL | Status: AC
  Administered 2013-01-15: 800 mg via ORAL
  Filled 2013-01-15: qty 1

## 2013-01-15 MED ORDER — OXYCODONE-ACETAMINOPHEN 5-325 MG PO TABS
1.0000 | ORAL_TABLET | Freq: Once | ORAL | Status: AC
  Administered 2013-01-15: 1 via ORAL
  Filled 2013-01-15: qty 1

## 2013-01-15 NOTE — ED Notes (Signed)
Abdominal cramps, on period, and hurting more than normal per pt. Also have a headache per pt.

## 2013-01-15 NOTE — ED Provider Notes (Addendum)
History     CSN: [id number]  Arrival date & time 01/15/13  0144   First MD Initiated Contact with Patient 01/15/13 0154      Chief Complaint  Patient presents with  . Headache  . Abdominal Pain     Patient is a 32 y.o. female presenting with abdominal pain. The history is provided by the patient.  Abdominal Pain Pain location:  LLQ and RLQ Pain quality: cramping   Pain severity:  Moderate Onset quality:  Gradual Duration: several days. Timing:  Intermittent Progression:  Worsening Chronicity:  New Relieved by:  Nothing Worsened by:  Nothing tried Associated symptoms: vaginal bleeding   Associated symptoms: no vomiting    Pt reports that she has increased menstrual cramp pain and increased vag bleeding.  She reports this pain is similar but worse than her typical cramping with her menstrual cycle No weakness.  No vomiting.  No fever is reported No dysuria is reported Past Medical History  Diagnosis Date  . Hypertension   . Sleep apnea     History reviewed. No pertinent past surgical history.  No family history on file.  History  Substance Use Topics  . Smoking status: Never Smoker   . Smokeless tobacco: Not on file  . Alcohol Use: No    OB History   Grav Para Term Preterm Abortions TAB SAB Ect Mult Living                  Review of Systems  Gastrointestinal: Positive for abdominal pain. Negative for vomiting.  Genitourinary: Positive for vaginal bleeding.  All other systems reviewed and are negative.    Allergies  Review of patient's allergies indicates no known allergies.  Home Medications  No current outpatient prescriptions on file.  BP 126/73  Pulse 81  Temp(Src) 97.8 F (36.6 C) (Oral)  Resp 18  Ht 4\' 11"  (1.499 m)  Wt 160 lb (72.576 kg)  BMI 32.3 kg/m2  SpO2 98%  LMP 01/11/2013  Physical Exam CONSTITUTIONAL: Well developed/well nourished HEAD: Normocephalic/atraumatic EYES: EOMI/PERRL ENMT: Mucous membranes moist NECK: supple  no meningeal signs SPINE:entire spine nontender CV: S1/S2 noted, no murmurs/rubs/gallops noted LUNGS: Lungs are clear to auscultation bilaterally, no apparent distress ABDOMEN: soft, nontender, no rebound or guarding GU:no cva tenderness. No cmt.  Small amt of vag bleeding noted.  No adnexal tenderness or mass noted Chaperone present NEURO: Pt is awake/alert, moves all extremitiesx4 EXTREMITIES: pulses normal, full ROM SKIN: warm, color normal PSYCH: no abnormalities of mood noted  ED Course  Procedures (including critical care time)  Labs Reviewed  URINALYSIS, ROUTINE W REFLEX MICROSCOPIC - Abnormal; Notable for the following:    Specific Gravity, Urine >1.030 (*)    Hgb urine dipstick LARGE (*)    All other components within normal limits  URINE MICROSCOPIC-ADD ON - Abnormal; Notable for the following:    Squamous Epithelial / LPF MANY (*)    All other components within normal limits  POCT PREGNANCY, URINE   Pt reports pain has been gradual, similar in characteristic to previous menstrual cramping but worse.  I doubt TOA or torsion.  Also, doubt acute abdominal process as her abd exam is benign.  She reports pain became worse with passing of large clots earlier tonight.  Advised need for gynecology followup.  Pt agreeable   For her HA, it was already resolving by my evaluation and she felt this was related to her menstrual cramps MDM  Nursing notes including past medical history and  social history reviewed and considered in documentation Labs/vital reviewed and considered         Joya Gaskins, MD 01/15/13 1610  Joya Gaskins, MD 01/15/13 [phone]

## 2013-06-23 ENCOUNTER — Encounter (HOSPITAL_COMMUNITY): Payer: Self-pay

## 2013-06-23 ENCOUNTER — Emergency Department (HOSPITAL_COMMUNITY)
Admission: EM | Admit: 2013-06-23 | Discharge: 2013-06-23 | Disposition: A | Discharge status: Home / Self Care | Payer: Medicaid Other | Attending: Emergency Medicine | Admitting: Emergency Medicine

## 2013-06-23 DIAGNOSIS — M542 Cervicalgia: Secondary | ICD-10-CM | POA: Insufficient documentation

## 2013-06-23 DIAGNOSIS — I10 Essential (primary) hypertension: Secondary | ICD-10-CM | POA: Insufficient documentation

## 2013-06-23 DIAGNOSIS — Z8669 Personal history of other diseases of the nervous system and sense organs: Secondary | ICD-10-CM | POA: Insufficient documentation

## 2013-06-23 DIAGNOSIS — L049 Acute lymphadenitis, unspecified: Secondary | ICD-10-CM

## 2013-06-23 LAB — CBC WITH DIFFERENTIAL/PLATELET
Basophils Absolute: 0.1 10*3/uL (ref 0.0–0.1)
Eosinophils Relative: 2 % (ref 0–5)
Lymphocytes Relative: 41 % (ref 12–46)
Lymphs Abs: 4 10*3/uL (ref 0.7–4.0)
MCV: 92.2 fL (ref 78.0–100.0)
Neutro Abs: 4.7 10*3/uL (ref 1.7–7.7)
Neutrophils Relative %: 48 % (ref 43–77)
Platelets: 302 10*3/uL (ref 150–400)
RBC: 3.74 MIL/uL — ABNORMAL LOW (ref 3.87–5.11)
RDW: 12.7 % (ref 11.5–15.5)
WBC: 9.8 10*3/uL (ref 4.0–10.5)

## 2013-06-23 MED ORDER — CEPHALEXIN 500 MG PO CAPS: 500.0000 mg | ORAL_CAPSULE | Freq: Four times a day (QID) | ORAL | Status: DC

## 2013-06-23 MED ORDER — IBUPROFEN 600 MG PO TABS: ORAL_TABLET | ORAL | Status: DC

## 2013-06-23 MED ORDER — CEPHALEXIN 500 MG PO CAPS
500.0000 mg | ORAL_CAPSULE | Freq: Once | ORAL | Status: AC
  Administered 2013-06-23: 500 mg via ORAL
  Filled 2013-06-23: qty 1

## 2013-06-23 MED ORDER — IBUPROFEN 800 MG PO TABS
800.0000 mg | ORAL_TABLET | Freq: Once | ORAL | Status: AC
  Administered 2013-06-23: 800 mg via ORAL
  Filled 2013-06-23: qty 1

## 2013-06-23 NOTE — ED Notes (Signed)
Knot to left side of neck, it has been there for 3 days per pt.

## 2013-06-26 NOTE — ED Provider Notes (Signed)
CSN: [id number]     Arrival date & time 06/23/13  2046 History     First MD Initiated Contact with Patient 06/23/13 2123     Chief Complaint  Patient presents with  . Cyst   (Consider location/radiation/quality/duration/timing/severity/associated sxs/prior Treatment) HPI Comments: VINETTA BRACH is a 32 y.o. Female presenting with a tender nodule on the left side of her neck which she first noticed 2 weeks ago and has become more tender the past 2 days.  She denies any recent illnesses such as uri, fever, sore throat and has had no scalp lesions or rash.  She has taken tylenol with no relief of her symptoms.  She otherwise feels well,  Denies fevers, fatigue, loss of appetite, no history of cough or congestion.     The history is provided by the patient.    Past Medical History  Diagnosis Date  . Hypertension   . Sleep apnea    History reviewed. No pertinent past surgical history. No family history on file. History  Substance Use Topics  . Smoking status: Never Smoker   . Smokeless tobacco: Not on file  . Alcohol Use: No   OB History   Grav Para Term Preterm Abortions TAB SAB Ect Mult Living                 Review of Systems  Constitutional: Negative for fever, chills, activity change, appetite change and fatigue.  HENT: Positive for neck pain. Negative for congestion, sore throat and rhinorrhea.   Eyes: Negative.   Respiratory: Negative for chest tightness and shortness of breath.   Cardiovascular: Negative for chest pain.  Gastrointestinal: Negative for nausea and abdominal pain.  Genitourinary: Negative.   Musculoskeletal: Negative for joint swelling and arthralgias.  Skin: Negative.  Negative for rash and wound.  Neurological: Negative for dizziness, weakness, light-headedness, numbness and headaches.  Psychiatric/Behavioral: Negative.     Allergies  Review of patient's allergies indicates no known allergies.  Home Medications   Current Outpatient Rx  Name   Route  Sig  Dispense  Refill  . acetaminophen (TYLENOL) 500 MG tablet   Oral   Take 500 mg by mouth every 6 (six) hours as needed for pain.         . cephALEXin (KEFLEX) 500 MG capsule   Oral   Take 1 capsule (500 mg total) by mouth 4 (four) times daily.   40 capsule   0   . ibuprofen (ADVIL,MOTRIN) 600 MG tablet      One tablet by mouth every 8 hours.   30 tablet   0    BP 136/90  Pulse 75  Temp(Src) 98.3 F (36.8 C) (Oral)  Resp 18  Ht 4\' 11"  (1.499 m)  Wt 158 lb 1.6 oz (71.714 kg)  BMI 31.92 kg/m2  SpO2 100%  LMP 06/17/2013 Physical Exam  Nursing note and vitals reviewed. Constitutional: She appears well-developed and well-nourished.  HENT:  Head: Normocephalic and atraumatic.  Eyes: Conjunctivae are normal.  Neck: Normal range of motion. Neck supple. No spinous process tenderness and no muscular tenderness present. No edema, no erythema and normal range of motion present. No thyromegaly present.  Tender isolated enlarged, mobile lymph node mid posterior cervical chain.  Cardiovascular: Normal rate, regular rhythm, normal heart sounds and intact distal pulses.   Pulmonary/Chest: Effort normal and breath sounds normal. She has no wheezes.  Abdominal: Soft. Bowel sounds are normal. There is no tenderness.  Musculoskeletal: Normal range of motion.  Neurological:  She is alert.  Skin: Skin is warm and dry.  Psychiatric: She has a normal mood and affect.    ED Course   Procedures (including critical care time)  Labs Reviewed  CBC WITH DIFFERENTIAL - Abnormal; Notable for the following:    RBC 3.74 (*)    Hemoglobin 11.2 (*)    HCT 34.5 (*)    All other components within normal limits   No results found. 1. Lymphadenitis, acute     MDM  Patients labs and/or radiological studies were viewed and considered during the medical decision making and disposition process.  Pt was placed on keflex, ibuprofen, discussed warm compresses,  Avoid rubbing the area.   Suggested recheck in 2-3 weeks after abx completed if not completely resolved.  Referrals given for pcp, advised return here if unable to obtain pcp and sx persist.  Burgess Amor, PA-C 06/26/13 [phone]

## 2013-06-29 NOTE — ED Provider Notes (Signed)
Medical screening examination/treatment/procedure(s) were performed by non-physician practitioner and as supervising physician I was immediately available for consultation/collaboration.  Josely Moffat, MD 06/29/13 0950 

## 2014-12-16 ENCOUNTER — Encounter (HOSPITAL_COMMUNITY): Payer: Self-pay | Admitting: Emergency Medicine

## 2014-12-16 ENCOUNTER — Emergency Department (HOSPITAL_COMMUNITY): Payer: Medicaid Other

## 2014-12-16 ENCOUNTER — Emergency Department (HOSPITAL_COMMUNITY)
Admission: EM | Admit: 2014-12-16 | Discharge: 2014-12-16 | Disposition: A | Discharge status: Home / Self Care | Payer: Medicaid Other | Attending: Emergency Medicine | Admitting: Emergency Medicine

## 2014-12-16 DIAGNOSIS — R102 Pelvic and perineal pain: Secondary | ICD-10-CM | POA: Diagnosis not present

## 2014-12-16 DIAGNOSIS — I10 Essential (primary) hypertension: Secondary | ICD-10-CM | POA: Diagnosis not present

## 2014-12-16 DIAGNOSIS — Z791 Long term (current) use of non-steroidal anti-inflammatories (NSAID): Secondary | ICD-10-CM | POA: Diagnosis not present

## 2014-12-16 DIAGNOSIS — Z8669 Personal history of other diseases of the nervous system and sense organs: Secondary | ICD-10-CM | POA: Insufficient documentation

## 2014-12-16 DIAGNOSIS — N898 Other specified noninflammatory disorders of vagina: Secondary | ICD-10-CM | POA: Insufficient documentation

## 2014-12-16 DIAGNOSIS — R1030 Lower abdominal pain, unspecified: Secondary | ICD-10-CM | POA: Diagnosis present

## 2014-12-16 DIAGNOSIS — Z792 Long term (current) use of antibiotics: Secondary | ICD-10-CM | POA: Diagnosis not present

## 2014-12-16 DIAGNOSIS — Z3202 Encounter for pregnancy test, result negative: Secondary | ICD-10-CM | POA: Diagnosis not present

## 2014-12-16 LAB — URINALYSIS, ROUTINE W REFLEX MICROSCOPIC
BILIRUBIN URINE: NEGATIVE
GLUCOSE, UA: NEGATIVE mg/dL
Ketones, ur: NEGATIVE mg/dL
Leukocytes, UA: NEGATIVE
Nitrite: NEGATIVE
PH: 6 (ref 5.0–8.0)
PROTEIN: NEGATIVE mg/dL
Specific Gravity, Urine: >1.03 — ABNORMAL HIGH (ref 1.005–1.030)
UROBILINOGEN UA: 0.2 mg/dL (ref 0.0–1.0)

## 2014-12-16 LAB — URINE MICROSCOPIC-ADD ON

## 2014-12-16 LAB — PREGNANCY, URINE: PREG TEST UR: NEGATIVE

## 2014-12-16 LAB — WET PREP, GENITAL
TRICH WET PREP: NONE SEEN
YEAST WET PREP: NONE SEEN

## 2014-12-16 MED ORDER — AZITHROMYCIN 250 MG PO TABS
1000.0000 mg | ORAL_TABLET | Freq: Once | ORAL | Status: AC
  Administered 2014-12-16: 1000 mg via ORAL
  Filled 2014-12-16: qty 4

## 2014-12-16 MED ORDER — DOXYCYCLINE HYCLATE 100 MG PO CAPS: 100.0000 mg | ORAL_CAPSULE | Freq: Two times a day (BID) | ORAL | Status: DC

## 2014-12-16 MED ORDER — METRONIDAZOLE 500 MG PO TABS
2000.0000 mg | ORAL_TABLET | Freq: Once | ORAL | Status: AC
  Administered 2014-12-16: 2000 mg via ORAL
  Filled 2014-12-16: qty 4

## 2014-12-16 MED ORDER — CEFTRIAXONE SODIUM 250 MG IJ SOLR
250.0000 mg | Freq: Once | INTRAMUSCULAR | Status: AC
  Administered 2014-12-16: 250 mg via INTRAMUSCULAR
  Filled 2014-12-16: qty 250

## 2014-12-16 MED ORDER — LIDOCAINE HCL (PF) 1 % IJ SOLN
INTRAMUSCULAR | Status: AC
  Administered 2014-12-16: 5 mL
  Filled 2014-12-16: qty 5

## 2014-12-16 NOTE — Discharge Instructions (Signed)
1. Medications: doxycycline, usual home medications 2. Treatment: rest, drink plenty of fluids,  3. Follow Up: Please followup with Dr. Glo Herring in 7 days for discussion of your diagnoses and further evaluation after today's visit; if you do not have a primary care doctor use the resource guide provided to find one; Please return to the ER for worsening pain, fevers, persistent vomiting or other concerns  Pelvic Inflammatory Disease Pelvic inflammatory disease (PID) refers to an infection in some or all of the female organs. The infection can be in the uterus, ovaries, fallopian tubes, or the surrounding tissues in the pelvis. PID can cause abdominal or pelvic pain that comes on suddenly (acute pelvic pain). PID is a serious infection because it can lead to lasting (chronic) pelvic pain or the inability to have children (infertile).  CAUSES  The infection is often caused by the normal bacteria found in the vaginal tissues. PID may also be caused by an infection that is spread during sexual contact. PID can also occur following:   The birth of a baby.   A miscarriage.   An abortion.   Major pelvic surgery.   The use of an intrauterine device (IUD).   A sexual assault.  RISK FACTORS Certain factors can put a person at higher risk for PID, such as:  Being younger than 25 years.  Being sexually active at [place] age.  Usingnonbarrier contraception.  Havingmultiple sexual partners.  Having sex with someone who has symptoms of a genital infection.  Using oral contraception. Other times, certain behaviors can increase the possibility of getting PID, such as:  Having sex during your period.  Using a vaginal douche.  Having an intrauterine device (IUD) in place. SYMPTOMS   Abdominal or pelvic pain.   Fever.   Chills.   Abnormal vaginal discharge.  Abnormal uterine bleeding.   Unusual pain shortly after finishing your period. DIAGNOSIS  Your caregiver will  choose some of the following methods to make a diagnosis, such as:   Performinga physical exam and history. A pelvic exam typically reveals a very tender uterus and surrounding pelvis.   Ordering laboratory tests including a pregnancy test, blood tests, and urine test.  Orderingcultures of the vagina and cervix to check for a sexually transmitted infection (STI).  Performing an ultrasound.   Performing a laparoscopic procedure to look inside the pelvis.  TREATMENT   Antibiotic medicines may be prescribed and taken by mouth.   Sexual partners may be treated when the infection is caused by a sexually transmitted disease (STD).   Hospitalization may be needed to give antibiotics intravenously.  Surgery may be needed, but this is rare. It may take weeks until you are completely well. If you are diagnosed with PID, you should also be checked for human immunodeficiency virus (HIV). HOME CARE INSTRUCTIONS   If given, take your antibiotics as directed. Finish the medicine even if you start to feel better.   Only take over-the-counter or prescription medicines for pain, discomfort, or fever as directed by your caregiver.   Do not have sexual intercourse until treatment is completed or as directed by your caregiver. If PID is confirmed, your recent sexual partner(s) will need treatment.   Keep your follow-up appointments. SEEK MEDICAL CARE IF:   You have increased or abnormal vaginal discharge.   You need prescription medicine for your pain.   You vomit.   You cannot take your medicines.   Your partner has an STD.  SEEK IMMEDIATE MEDICAL CARE IF:  You have a fever.   You have increased abdominal or pelvic pain.   You have chills.   You have pain when you urinate.   You are not better after 72 hours following treatment.  MAKE SURE YOU:   Understand these instructions.  Will watch your condition.  Will get help right away if you are not doing well  or get worse. Document Released: 11/09/2005 Document Revised: 03/06/2013 Document Reviewed: 11/05/2011 [facility] Patient Information 2015 [place], [place]. This information is not intended to replace advice given to you by your health care provider. Make sure you discuss any questions you have with your health care provider.

## 2014-12-16 NOTE — ED Notes (Addendum)
Patient c/o lower abd pain x2 days ago. Denies any nausea, vomiting, diarrhea, or fevers. Per patient greenish yellow discharge with "fishy" odor x3 weeks. Denies any pain with urination, itching, or lesions.

## 2014-12-16 NOTE — ED Provider Notes (Signed)
CSN: [id number]     Arrival date & time 12/16/14  1732 History   First MD Initiated Contact with Patient 12/16/14 1800     Chief Complaint  Patient presents with  . Abdominal Pain     (Consider location/radiation/quality/duration/timing/severity/associated sxs/prior Treatment) The history is provided by the patient and medical records. No language interpreter was used.     Debra Tanner is a 34 y.o. female  with a hx of HTN presents to the Emergency Department complaining of gradual, persistent, progressively worsening vaginal discharge x 3 weeks with associated lower abd pain onset 2 days ago.  Patient reports she's had several weeks of associated pain with intercourse. Pt reports green/yellow vaginal discharge with "fishy odor."  No treatments prior to arrival. She denies to body washes and douching. Patient reports a 2 sexual partners in the last 6 months without regular condom usage. She sexually active without birth control. Last menstrual 11/26/2014.  Pt denies fever, chills, headache, neck pain, chest pain, SOB, N/V/D, weakness, dizziness, syncope, dysuria, vaginal itching or noted.     Past Medical History  Diagnosis Date  . Hypertension   . Sleep apnea    History reviewed. No pertinent past surgical history. History reviewed. No pertinent family history. History  Substance Use Topics  . Smoking status: Never Smoker   . Smokeless tobacco: Never Used  . Alcohol Use: No   OB History    Gravida Para Term Preterm AB TAB SAB Ectopic Multiple Living   2 1 1  1  1   1      Review of Systems  Constitutional: Negative for fever, diaphoresis, appetite change, fatigue and unexpected weight change.  HENT: Negative for mouth sores and trouble swallowing.   Respiratory: Negative for cough, chest tightness, shortness of breath, wheezing and stridor.   Cardiovascular: Negative for chest pain and palpitations.  Gastrointestinal: Positive for abdominal pain (suprapubic ). Negative for  nausea, vomiting, diarrhea, constipation, blood in stool, abdominal distention and rectal pain.  Genitourinary: Positive for vaginal discharge and pelvic pain. Negative for dysuria, urgency, frequency, hematuria, flank pain and difficulty urinating.  Musculoskeletal: Negative for back pain, neck pain and neck stiffness.  Skin: Negative for rash.  Neurological: Negative for weakness.  Hematological: Negative for adenopathy.  Psychiatric/Behavioral: Negative for confusion.  All other systems reviewed and are negative.     Allergies  Review of patient's allergies indicates no known allergies.  Home Medications   Prior to Admission medications   Medication Sig Start Date End Date Taking? Authorizing Provider  acetaminophen (TYLENOL) 500 MG tablet Take 500 mg by mouth every 6 (six) hours as needed for pain.    Historical Provider, MD  cephALEXin (KEFLEX) 500 MG capsule Take 1 capsule (500 mg total) by mouth 4 (four) times daily. Patient not taking: Reported on 12/16/2014 06/23/13   Evalee Jefferson, PA-C  doxycycline (VIBRAMYCIN) 100 MG capsule Take 1 capsule (100 mg total) by mouth 2 (two) times daily. 12/16/14   Raysha Tilmon, PA-C  ibuprofen (ADVIL,MOTRIN) 600 MG tablet One tablet by mouth every 8 hours. Patient not taking: Reported on 12/16/2014 06/23/13   Evalee Jefferson, PA-C   BP 128/75 mmHg  Pulse 74  Temp(Src) 97.5 F (36.4 C) (Oral)  Resp 20  Ht 5' (1.524 m)  Wt 150 lb (68.04 kg)  BMI 29.30 kg/m2  SpO2 99%  LMP 11/27/2014 Physical Exam  Constitutional: She appears well-developed and well-nourished. No distress.  Awake, alert, nontoxic appearance  HENT:  Head: Normocephalic and  atraumatic.  Mouth/Throat: Oropharynx is clear and moist. No oropharyngeal exudate.  Eyes: Conjunctivae are normal. No scleral icterus.  Neck: Normal range of motion. Neck supple.  Cardiovascular: Normal rate, regular rhythm, normal heart sounds and intact distal pulses.   No murmur  heard. Pulmonary/Chest: Effort normal and breath sounds normal. No respiratory distress. She has no wheezes.  Equal chest expansion  Abdominal: Soft. Bowel sounds are normal. She exhibits no distension and no mass. There is no tenderness. There is no rebound, no guarding and no CVA tenderness. Hernia confirmed negative in the right inguinal area and confirmed negative in the left inguinal area.  Genitourinary: Uterus normal. No labial fusion. There is no rash, tenderness or lesion on the right labia. There is no rash, tenderness or lesion on the left labia. Uterus is not deviated, not enlarged, not fixed and not tender. Cervix exhibits no motion tenderness, no discharge and no friability. Right adnexum displays tenderness. Right adnexum displays no mass and no fullness. Left adnexum displays no mass, no tenderness and no fullness. No erythema, tenderness or bleeding in the vagina. No foreign body around the vagina. No signs of injury around the vagina. Vaginal discharge (Thick, white,) found.  Mild right adnexal tenderness Cervix visualized and friable, several discrete areas which appear to be hemangiomas  Musculoskeletal: Normal range of motion. She exhibits no edema.  Lymphadenopathy:       Right: No inguinal adenopathy present.       Left: No inguinal adenopathy present.  Neurological: She is alert. She exhibits normal muscle tone. Coordination normal.  Speech is clear and goal oriented Moves extremities without ataxia  Skin: Skin is warm and dry. She is not diaphoretic. No erythema.  Psychiatric: She has a normal mood and affect.  Nursing note and vitals reviewed.   ED Course  Procedures (including critical care time) Labs Review Labs Reviewed  WET PREP, GENITAL - Abnormal; Notable for the following:    Clue Cells Wet Prep HPF POC FEW (*)    WBC, Wet Prep HPF POC FEW (*)    All other components within normal limits  URINALYSIS, ROUTINE W REFLEX MICROSCOPIC - Abnormal; Notable for the  following:    APPearance HAZY (*)    Specific Gravity, Urine >1.030 (*)    Hgb urine dipstick LARGE (*)    All other components within normal limits  URINE MICROSCOPIC-ADD ON - Abnormal; Notable for the following:    Squamous Epithelial / LPF MANY (*)    Bacteria, UA MANY (*)    All other components within normal limits  URINE CULTURE  PREGNANCY, URINE  RPR  HIV ANTIBODY (ROUTINE TESTING)  GC/CHLAMYDIA PROBE AMP ([place])    Imaging Review US Transvaginal Non-ob  12/16/2014   CLINICAL DATA:  Lower abdominal pain for 2 days. Positive vaginal discharge.  EXAM: TRANSABDOMINAL AND TRANSVAGINAL ULTRASOUND OF PELVIS  DOPPLER ULTRASOUND OF OVARIES  TECHNIQUE: Both transabdominal and transvaginal ultrasound examinations of the pelvis were performed. Transabdominal technique was performed for global imaging of the pelvis including uterus, ovaries, adnexal regions, and pelvic cul-de-sac.  It was necessary to proceed with endovaginal exam following the transabdominal exam to visualize the uterus, endometrium and ovaries to better advantage. Color and duplex Doppler ultrasound was utilized to evaluate blood flow to the ovaries.  COMPARISON:  None.  FINDINGS: Uterus  Measurements: 8.3 cm x 5.2 cm x 6.0 cm. Mildly hyperechoic mass arises from the left anterior uterine fundus measuring 2.2 cm x 2 cm x 2.1  cm most likely a fibroid. No other uterine abnormality.  Endometrium  Thickness: 12 mm.  No focal abnormality visualized.  Right ovary  Measurements: 4.3 cm x 2.7 cm x 2.2 cm. Normal appearance/no adnexal mass.  Left ovary  Measurements: 3.6 cm x 2.6 cm x 2.3 cm. Normal appearance/no adnexal mass.  Pulsed Doppler evaluation of both ovaries demonstrates normal low-resistance arterial and venous waveforms.  Other findings  No free fluid.  IMPRESSION: 1. No acute findings. 2. No ovarian torsion. 3. 2.2 cm probable uterine fibroid.  No other abnormalities.   Electronically Signed   By: Lajean Manes M.D.   On:  12/16/2014 20:54   US Pelvis Complete  12/16/2014   CLINICAL DATA:  Lower abdominal pain for 2 days. Positive vaginal discharge.  EXAM: TRANSABDOMINAL AND TRANSVAGINAL ULTRASOUND OF PELVIS  DOPPLER ULTRASOUND OF OVARIES  TECHNIQUE: Both transabdominal and transvaginal ultrasound examinations of the pelvis were performed. Transabdominal technique was performed for global imaging of the pelvis including uterus, ovaries, adnexal regions, and pelvic cul-de-sac.  It was necessary to proceed with endovaginal exam following the transabdominal exam to visualize the uterus, endometrium and ovaries to better advantage. Color and duplex Doppler ultrasound was utilized to evaluate blood flow to the ovaries.  COMPARISON:  None.  FINDINGS: Uterus  Measurements: 8.3 cm x 5.2 cm x 6.0 cm. Mildly hyperechoic mass arises from the left anterior uterine fundus measuring 2.2 cm x 2 cm x 2.1 cm most likely a fibroid. No other uterine abnormality.  Endometrium  Thickness: 12 mm.  No focal abnormality visualized.  Right ovary  Measurements: 4.3 cm x 2.7 cm x 2.2 cm. Normal appearance/no adnexal mass.  Left ovary  Measurements: 3.6 cm x 2.6 cm x 2.3 cm. Normal appearance/no adnexal mass.  Pulsed Doppler evaluation of both ovaries demonstrates normal low-resistance arterial and venous waveforms.  Other findings  No free fluid.  IMPRESSION: 1. No acute findings. 2. No ovarian torsion. 3. 2.2 cm probable uterine fibroid.  No other abnormalities.   Electronically Signed   By: Lajean Manes M.D.   On: 12/16/2014 20:54   [place] Art/ven Flow Abd Pelv Doppler  12/16/2014   CLINICAL DATA:  Lower abdominal pain for 2 days. Positive vaginal discharge.  EXAM: TRANSABDOMINAL AND TRANSVAGINAL ULTRASOUND OF PELVIS  DOPPLER ULTRASOUND OF OVARIES  TECHNIQUE: Both transabdominal and transvaginal ultrasound examinations of the pelvis were performed. Transabdominal technique was performed for global imaging of the pelvis including uterus, ovaries, adnexal  regions, and pelvic cul-de-sac.  It was necessary to proceed with endovaginal exam following the transabdominal exam to visualize the uterus, endometrium and ovaries to better advantage. Color and duplex Doppler ultrasound was utilized to evaluate blood flow to the ovaries.  COMPARISON:  None.  FINDINGS: Uterus  Measurements: 8.3 cm x 5.2 cm x 6.0 cm. Mildly hyperechoic mass arises from the left anterior uterine fundus measuring 2.2 cm x 2 cm x 2.1 cm most likely a fibroid. No other uterine abnormality.  Endometrium  Thickness: 12 mm.  No focal abnormality visualized.  Right ovary  Measurements: 4.3 cm x 2.7 cm x 2.2 cm. Normal appearance/no adnexal mass.  Left ovary  Measurements: 3.6 cm x 2.6 cm x 2.3 cm. Normal appearance/no adnexal mass.  Pulsed Doppler evaluation of both ovaries demonstrates normal low-resistance arterial and venous waveforms.  Other findings  No free fluid.  IMPRESSION: 1. No acute findings. 2. No ovarian torsion. 3. 2.2 cm probable uterine fibroid.  No other abnormalities.   Electronically Signed  By: Lajean Manes M.D.   On: 12/16/2014 20:54     EKG Interpretation None      MDM   Final diagnoses:  Lower abdominal pain  Pelvic pain in female   Simora D Dennie presents with suprapubic abdominal pain. On pelvic exam thick vaginal discharge with right adnexal tenderness. Will obtain wet prep, UA, pregnancy test and ultrasound to rule out TOA.  8:15 PM Urinalysis with out evidence of urinate tract infection, pregnancy test negative, wet prep with few white blood cells. Discharge not consistent with yeast. Will treat like PID. On exam patient cervix with several lesions which appear to be hemangiomas are recommended close follow-up with OB/GYN within 1 week for further evaluation.  9:02 PM [place] without torsion or cyst. 2.2 cm uterine fibroid.  No evidence of TOA. Patient will be treated for PID. At follow-up with OB/GYN within 1 week.  I have personally reviewed patient's  vitals, nursing note and any pertinent labs or imaging.  I performed an focused physical exam; undressed when appropriate .    It has been determined that no acute conditions requiring further emergency intervention are present at this time. The patient/guardian have been advised of the diagnosis and plan. I reviewed any labs and imaging including any potential incidental findings. We have discussed signs and symptoms that warrant return to the ED and they are listed in the discharge instructions.    Vital signs are stable at discharge.   BP 128/75 mmHg  Pulse 74  Temp(Src) 97.5 F (36.4 C) (Oral)  Resp 20  Ht 5' (1.524 m)  Wt 150 lb (68.04 kg)  BMI 29.30 kg/m2  SpO2 99%  LMP 11/27/2014         Aristotle Lieb, PA-C 12/16/14 2102  Veryl Speak, MD 12/16/14 [phone]

## 2014-12-17 LAB — GC/CHLAMYDIA PROBE AMP (~~LOC~~) NOT AT ARMC
CHLAMYDIA, DNA PROBE: NEGATIVE
Neisseria Gonorrhea: NEGATIVE

## 2014-12-18 LAB — URINE CULTURE: Colony Count: 35000

## 2014-12-18 LAB — HIV ANTIBODY (ROUTINE TESTING W REFLEX)
HIV 1/O/2 Abs-Index Value: <1 (ref <1.00)
HIV-1/HIV-2 Ab: NONREACTIVE

## 2014-12-18 LAB — RPR: RPR: NONREACTIVE

## 2015-10-20 ENCOUNTER — Emergency Department (HOSPITAL_COMMUNITY)
Admission: EM | Admit: 2015-10-20 | Discharge: 2015-10-20 | Disposition: A | Discharge status: Home / Self Care | Payer: Medicaid Other | Attending: Emergency Medicine | Admitting: Emergency Medicine

## 2015-10-20 ENCOUNTER — Encounter (HOSPITAL_COMMUNITY): Payer: Self-pay | Admitting: Emergency Medicine

## 2015-10-20 DIAGNOSIS — R1030 Lower abdominal pain, unspecified: Secondary | ICD-10-CM | POA: Diagnosis present

## 2015-10-20 DIAGNOSIS — Z3202 Encounter for pregnancy test, result negative: Secondary | ICD-10-CM | POA: Insufficient documentation

## 2015-10-20 DIAGNOSIS — Z8669 Personal history of other diseases of the nervous system and sense organs: Secondary | ICD-10-CM | POA: Diagnosis not present

## 2015-10-20 DIAGNOSIS — I10 Essential (primary) hypertension: Secondary | ICD-10-CM | POA: Diagnosis not present

## 2015-10-20 DIAGNOSIS — R102 Pelvic and perineal pain: Secondary | ICD-10-CM | POA: Diagnosis not present

## 2015-10-20 DIAGNOSIS — N898 Other specified noninflammatory disorders of vagina: Secondary | ICD-10-CM | POA: Diagnosis not present

## 2015-10-20 LAB — URINALYSIS, ROUTINE W REFLEX MICROSCOPIC
BILIRUBIN URINE: NEGATIVE
GLUCOSE, UA: NEGATIVE mg/dL
KETONES UR: NEGATIVE mg/dL
Leukocytes, UA: NEGATIVE
Nitrite: NEGATIVE
PROTEIN: NEGATIVE mg/dL
Specific Gravity, Urine: >1.03 — ABNORMAL HIGH (ref 1.005–1.030)
pH: 5.5 (ref 5.0–8.0)

## 2015-10-20 LAB — WET PREP, GENITAL
Clue Cells Wet Prep HPF POC: NONE SEEN
Sperm: NONE SEEN
Trich, Wet Prep: NONE SEEN
WBC, Wet Prep HPF POC: NONE SEEN
Yeast Wet Prep HPF POC: NONE SEEN

## 2015-10-20 LAB — URINE MICROSCOPIC-ADD ON: WBC UA: NONE SEEN WBC/hpf (ref 0–5)

## 2015-10-20 LAB — PREGNANCY, URINE: PREG TEST UR: NEGATIVE

## 2015-10-20 MED ORDER — NAPROXEN 250 MG PO TABS: 250.0000 mg | ORAL_TABLET | Freq: Two times a day (BID) | ORAL | Status: DC | PRN

## 2015-10-20 NOTE — Discharge Instructions (Signed)
°Emergency Department Resource Guide °1) Find a Doctor and Pay Out of Pocket °Although you won't have to find out who is covered by your insurance plan, it is a good idea to ask around and get recommendations. You will then need to call the office and see if the doctor you have chosen will accept you as a new patient and what types of options they offer for patients who are self-pay. Some doctors offer discounts or will set up payment plans for their patients who do not have insurance, but you will need to ask so you aren't surprised when you get to your appointment. ° °2) Contact Your Local Health Department °Not all health departments have doctors that can see patients for sick visits, but many do, so it is worth a call to see if yours does. If you don't know where your local health department is, you can check in your phone book. The CDC also has a tool to help you locate your state's health department, and many state websites also have listings of all of their local health departments. ° °3) Find a Walk-in Clinic °If your illness is not likely to be very severe or complicated, you may want to try a walk in clinic. These are popping up all over the country in pharmacies, drugstores, and shopping centers. They're usually staffed by nurse practitioners or physician assistants that have been trained to treat common illnesses and complaints. They're usually fairly quick and inexpensive. However, if you have serious medical issues or chronic medical problems, these are probably not your best option. ° °No Primary Care Doctor: °- Call [facility] at  832-8000 - they can help you locate a primary care doctor that  accepts your insurance, provides certain services, etc. °- Physician Referral Service- 1-800-533-3463 ° °Chronic Pain Problems: °Organization         Address  Phone   Notes  °[facility]  (336) [phone] Patients need to be referred by their primary care doctor.  ° °Medication  Assistance: °Organization         Address  Phone   Notes  °[place] [facility] [address] °[place], [place] [address] (336) 641-8030 --Must be a resident of [place] °-- Must have NO insurance coverage whatsoever (no Medicaid/ Medicare, etc.) °-- The pt. MUST have a primary care doctor that directs their care regularly and follows them in the community °  °[facility]  (866) 331-1348   °[employer]  (888) 892-1162   ° °Agencies that provide inexpensive medical care: °Organization         Address  Phone   Notes  °[facility]  (336) 832-8035   °[facility]    (336) 832-7272   °[facility] [address] °[place], [place] [address] (336) 832-4777   °[facility] [address], °[place] (336) 271-4999   °[facility]    (336) 373-0678   °[facility]    (336) 272-1050   °[facility] ° [address], [place] Phone:  (336) 832-4444, Fax:  (336) 832-4440 Hours of Operation:  9 am - 6 pm, M-F.  Also accepts Medicaid/Medicare and self-pay.  °[facility] ° [address], [place] Phone: (336) 832-3150, Fax: (336) [phone]. Hours of Operation:  8:30 am - 5:30 pm, M-F.  Also accepts Medicaid and self-pay.  °[facility] [place] [address], [place] Phone: (336) 878-6027   °[facility] [address], [place], [place] (336)723-1848, Ext. 123 Mondays & Thursdays: 7-9 AM.  First 15 patients are seen on a first come, first serve basis. °  ° °Medicaid-accepting [place] Providers: ° °Organization         Address  Phone   Notes  °[facility] [address], [place] (336) 641-2100 Also accepts self-pay patients.  °[facility] [address], [place] ° (336) 856-9996   °[facility] [address], [place]  (336) 288-8857   °[facility] [address] [place] [address], [place] (336) 299-7000   °Veita Bland [address], [place]  ° (336) 373-1557 Only accepts [place] Access Medicaid patients after they have their name applied to their card.  ° °Self-Pay (no insurance) in [place]: ° °Organization         Address  Phone   Notes  °Sickle Cell Patients, [facility] [address], [place] (336) 832-1970   °[facility] [address], [place] (336) 832-4400   °[facility] ° [address], [place] (336) 992-4800   °[facility]/Dr. Osei-Bonsu ° [address] [place] [address], [place] or [address], [place] (336) [phone] Phone number for both [place] and [place] locations is the same.  °[facility] [address], [place] (336) 299-0000   °[facility] [address] [place] [address], [place] or [address] (336) 852-7530 °(336) 878-2260   °[facility] [address], [place] (336) 350-1642, phone; (336) 294-5005, fax Sees patients 1st and 3rd Saturday of every month.  Must not qualify for public or private insurance (i.e. Medicaid, Medicare, [place] Health Choice, Veterans' Benefits) • Household income should be no more than 200% of the poverty level •The clinic cannot treat you if you are pregnant or think you are pregnant • Sexually transmitted diseases are not treated at the clinic.  ° ° °Dental Care: °Organization         Address  Phone  Notes  °[place] [facility] [address], [place] (336) 641-6152 Accepts children up to age 21 who are enrolled in Medicaid or [place] Health Choice; pregnant women with a Medicaid card; and children who have applied for Medicaid or [place] Health Choice, but were declined, whose parents can pay a reduced fee at time of service.  °[place]  [facility] [place]  [address], [place] (336) 641-7733 Accepts children up to age 21 who are enrolled in Medicaid or [place] Health Choice; pregnant women with a Medicaid card; and children who have applied for Medicaid or [place] Health Choice, but were declined, whose parents can pay a reduced fee at time of service.  °[facility] ° [address],  (336) 641-4533 Patients are seen by appointment only. Walk-ins are not accepted. [facility] will see patients 18 years of age and older. °Monday - Tuesday (8am-5pm) °Most Wednesdays (8:30-5pm) °$30 per visit, cash only  °[facility] ° [address], [place] (336) 641-4533 Patients are seen by appointment only. Walk-ins are not accepted. [facility] will see patients 18 years of age and older. °One   Wednesday Evening (Monthly: Volunteer Based).  $30 per visit, cash only  °[facility]  (919) 537-3737 for adults; Children under age 4, call Graduate Pediatric Dentistry at (919) [phone]. Children aged 4-14, please call (919) [phone] to request a pediatric application. ° Dental services are provided in all areas of dental care including fillings, crowns and bridges, complete and partial dentures, implants, gum treatment, root canals, and extractions. Preventive care is also provided. Treatment is provided to both adults and children. °Patients are selected via a lottery and there is often a waiting list. °  °[facility] [address], °[place] ° (336) 763-8833 [email or web address].[email or web address] °  °[facility] [address], [place], [place] (336)723-1848, Ext. 123 Second and Fourth Thursday of each month, opens at 6:30 AM; Clinic ends at 9 AM.  Patients are seen on a first-come first-served basis, and a limited number are seen during each clinic.  ° °[facility] ° [address], [place], [place] (336) 723-7904    Eligibility Requirements °You must have lived in [place], [place], or [place] counties for at least the last three months. °  You cannot be eligible for state or federal sponsored healthcare insurance, including Veterans Administration, Medicaid, or Medicare. °  You generally cannot be eligible for healthcare insurance through your employer.  °  How to apply: °Eligibility screenings are held every Tuesday and Wednesday afternoon from 1:00 pm until 4:00 pm. You do not need an appointment for the interview!  °[facility] [address], [place], [place] 336-631-2330   °[facility]  336-342-8273   °[facility]  336-703-3100   °[facility]  336-570-6415   ° °Behavioral Health Resources in the Community: °Intensive Outpatient Programs °Organization         Address  Phone  Notes  °[facility] [address], [place], [place] 336-878-6098   °[facility] [address], [place], [place] 336-832-9800   °[facility] [address], [place], [place] ° 336-882-2125   °[facility] [address],  °[place], [place] 1-800-853-5163 or 336-641-4981   °Substance Abuse Resources °Organization         Address  Phone  Notes  °[facility]  336-882-2125   °[facility]  336-784-9470   °[facility]  336-285-9073   °[facility]  336-845-3988   °Residential & Outpatient Substance Abuse Program  1-800-659-3381   °Psychological Services °Organization         Address  Phone  Notes  °[facility]  336- 832-9600   °[facility]  336- 378-7881   °[facility] [address], [place] 1-800-853-5163 or 336-641-4981   ° °Mobile Crisis Teams °Organization         Address  Phone  Notes  °[facility], Mobile Crisis Care Unit  1-877-626-1772   °Assertive °[facility] ° [address]  [place], [place] 336-834-9664   °Sharon DeEsch [address] °[place] 336-554-5454   ° °Self-Help/Support Groups °Organization         Address  Phone             Notes  °[facility] [place] - variety of support groups  336- 373-1402 Call for more information  °Narcotics Anonymous (NA), [facility] [address], °[place]  2 meetings at this location  ° °  Residential Treatment Programs Organization         Address  Phone  Notes  [facility] [address]    [place]  1-864 713 3813   [facility]  [address] [place] [phone], [place], [place]   [place], [place] [phone] Admissions: 8am-3pm M-F  [facility] [address]    [place], [place] [phone]   [facility] [address] [place], [place], [place]   [facility] [address]  [place], [place]   [facility] Intensive Outpatient [address], [place], [place]   [facility] [address]  [place], [place] 1-6412921027 or 7263408525   [facility] [address] [place], [place] Accepts Medicaid  [facility] [place] [address]  [place] 1-5161854135 Substance Abuse/Addiction Treatment   [facility]         Address  Phone  Notes  [facility]  305-798-2681   Domenic Schwab, PhD [address] [place], [place]   5313929978 or 916-472-7218   [facility] [place], [place] 724-709-7067   [facility] [address], [place], [place] 314-305-6921 Insurance/Medicaid/sponsorship through [facility] [address] [place]                                    [place], [place] [phone] [place] [address], [place] [phone]    Dr. Adele Schilder  [phone]   [facility] Dept. 1) [address], [place] 2) [place] 3)  [address], [place] [phone]  (236)[phone]   [place] [phone] or [phone] (After Hours)      Take the prescription as directed.  Apply moist heat to the area(s) of discomfort, for 15 minutes at a time, several times per day for the next few days.  Do not fall asleep on a heating pack.  Call your regular OB/GYN doctor on Monday to schedule a follow up appointment this week.  Return to the Emergency Department immediately if worsening.

## 2015-10-20 NOTE — ED Provider Notes (Signed)
CSN: [id number]     Arrival date & time 10/20/15  1438 History   First MD Initiated Contact with Patient 10/20/15 1511     Chief Complaint  Patient presents with  . Pelvic Pain  . Vaginal Discharge      HPI Pt was seen at 1510. Per pt, c/o gradual onset and persistence of constant vaginal discharge for the past 1 month. Has been associated with vaginal "odor" and suprapubic discomfort. Denies dysuria/hematuria, no flank pain, no N/V/D, no fevers, no rash. The symptoms have been associated with no other complaints. The patient has a significant history of similar symptoms previously, recently being evaluated for this complaint. Pt did not f/u with OB/GYN as instructed.       Past Medical History  Diagnosis Date  . Hypertension   . Sleep apnea    Past Surgical History  Procedure Laterality Date  . Ablation      Social History  Substance Use Topics  . Smoking status: Never Smoker   . Smokeless tobacco: Never Used  . Alcohol Use: Yes     Comment: occassionally   OB History    Gravida Para Term Preterm AB TAB SAB Ectopic Multiple Living   2 1 1  1  1   1      Review of Systems ROS: Statement: All systems negative except as marked or noted in the HPI; Constitutional: Negative for fever and chills. ; ; Eyes: Negative for eye pain, redness and discharge. ; ; ENMT: Negative for ear pain, hoarseness, nasal congestion, sinus pressure and sore throat. ; ; Cardiovascular: Negative for chest pain, palpitations, diaphoresis, dyspnea and peripheral edema. ; ; Respiratory: Negative for cough, wheezing and stridor. ; ; Gastrointestinal: Negative for nausea, vomiting, diarrhea, abdominal pain, blood in stool, hematemesis, jaundice and rectal bleeding. . ; ; Genitourinary: Negative for dysuria, flank pain and hematuria. ; ; GYN:  No vaginal bleeding, +vaginal discharge, no vulvar pain. ;; Musculoskeletal: Negative for back pain and neck pain. Negative for swelling and trauma.; ; Skin: Negative for  pruritus, rash, abrasions, blisters, bruising and skin lesion.; ; Neuro: Negative for headache, lightheadedness and neck stiffness. Negative for weakness, altered level of consciousness , altered mental status, extremity weakness, paresthesias, involuntary movement, seizure and syncope.      Allergies  Review of patient's allergies indicates no known allergies.  Home Medications   Prior to Admission medications   Not on File   BP 163/92 mmHg  Pulse 84  Temp(Src) 98.3 F (36.8 C) (Oral)  Resp 16  Ht 4\' 10"  (1.473 m)  Wt 161 lb 4.8 oz (73.165 kg)  BMI 33.72 kg/m2  SpO2 100%  LMP 10/16/2015 Physical Exam  1515: Physical examination:  Nursing notes reviewed; Vital signs and O2 SAT reviewed;  Constitutional: Well developed, Well nourished, Well hydrated, In no acute distress; Head:  Normocephalic, atraumatic; Eyes: EOMI, PERRL, No scleral icterus; ENMT: Mouth and pharynx normal, Mucous membranes moist; Neck: Supple, Full range of motion, No lymphadenopathy; Cardiovascular: Regular rate and rhythm, No murmur, rub, or gallop; Respiratory: Breath sounds clear & equal bilaterally, No wheezes.  Speaking full sentences with ease, Normal respiratory effort/excursion; Chest: Nontender, Movement normal; Abdomen: Soft, Nontender, Nondistended, Normal bowel sounds; Genitourinary: No CVA tenderness. Pelvic exam performed with permission of pt and female ED tech assist during exam.  External genitalia w/o lesions. Vaginal vault without discharge.  Cervix w/friable lesions appear hemangiomas, GC/chlam and wet prep obtained and sent to lab.  Bimanual exam w/o CMT or  adnexal tenderness. +very mild suprapubic tenderness.;;; Extremities: Pulses normal, No tenderness, No edema, No calf edema or asymmetry.; Neuro: AA&Ox3, Major CN grossly intact.  Speech clear. No gross focal motor or sensory deficits in extremities. Climbs on and off stretcher easily by herself. Gait steady.; Skin: Color normal, Warm, Dry.   ED  Course  Procedures (including critical care time) Labs Review   Imaging Review  I have personally reviewed and evaluated these images and lab results as part of my medical decision-making.   EKG Interpretation None      MDM  MDM Reviewed: previous chart, nursing note and vitals Reviewed previous: labs Interpretation: labs     Results for orders placed or performed during the hospital encounter of 10/20/15  Wet prep, genital  Result Value Ref Range   Yeast Wet Prep HPF POC NONE SEEN NONE SEEN   Trich, Wet Prep NONE SEEN NONE SEEN   Clue Cells Wet Prep HPF POC NONE SEEN NONE SEEN   WBC, Wet Prep HPF POC NONE SEEN NONE SEEN   Sperm NONE SEEN   Urinalysis, Routine w reflex microscopic (not at [facility])  Result Value Ref Range   Color, Urine YELLOW YELLOW   APPearance CLEAR CLEAR   Specific Gravity, Urine >1.030 (H) 1.005 - 1.030   pH 5.5 5.0 - 8.0   Glucose, UA NEGATIVE NEGATIVE mg/dL   Hgb urine dipstick SMALL (A) NEGATIVE   Bilirubin Urine NEGATIVE NEGATIVE   Ketones, ur NEGATIVE NEGATIVE mg/dL   Protein, ur NEGATIVE NEGATIVE mg/dL   Nitrite NEGATIVE NEGATIVE   Leukocytes, UA NEGATIVE NEGATIVE  Pregnancy, urine  Result Value Ref Range   Preg Test, Ur NEGATIVE NEGATIVE  Urine microscopic-add on  Result Value Ref Range   Squamous Epithelial / LPF 0-5 (A) NONE SEEN   WBC, UA NONE SEEN 0 - 5 WBC/hpf   RBC / HPF 0-5 0 - 5 RBC/hpf   Bacteria, UA FEW (A) NONE SEEN    1620:  Workup reassuring. Dx and testing d/w pt.  Questions answered.  Verb understanding, agreeable to d/c home with outpt f/u.      Francine Graven, DO 10/22/15 1943

## 2015-10-20 NOTE — ED Notes (Signed)
PT c/o lower abdominal pain with increased urinary frequency and reports strong odor to urine with some loose stool intermittently x2 weeks.

## 2015-10-21 LAB — GC/CHLAMYDIA PROBE AMP (~~LOC~~) NOT AT ARMC
Chlamydia: NEGATIVE
NEISSERIA GONORRHEA: NEGATIVE

## 2016-06-07 ENCOUNTER — Emergency Department (HOSPITAL_COMMUNITY)
Admission: EM | Admit: 2016-06-07 | Discharge: 2016-06-07 | Disposition: A | Discharge status: Home / Self Care | Payer: Medicaid Other | Attending: Emergency Medicine | Admitting: Emergency Medicine

## 2016-06-07 ENCOUNTER — Encounter (HOSPITAL_COMMUNITY): Payer: Self-pay | Admitting: *Deleted

## 2016-06-07 DIAGNOSIS — J029 Acute pharyngitis, unspecified: Secondary | ICD-10-CM

## 2016-06-07 DIAGNOSIS — I10 Essential (primary) hypertension: Secondary | ICD-10-CM | POA: Diagnosis not present

## 2016-06-07 LAB — RAPID STREP SCREEN (MED CTR MEBANE ONLY): STREPTOCOCCUS, GROUP A SCREEN (DIRECT): NEGATIVE

## 2016-06-07 MED ORDER — PREDNISONE 50 MG PO TABS
60.0000 mg | ORAL_TABLET | Freq: Once | ORAL | Status: AC
  Administered 2016-06-07: 60 mg via ORAL
  Filled 2016-06-07: qty 1

## 2016-06-07 MED ORDER — LORATADINE 10 MG PO TABS: 10.0000 mg | ORAL_TABLET | Freq: Every day | ORAL | Status: DC

## 2016-06-07 MED ORDER — PREDNISONE 20 MG PO TABS: 40.0000 mg | ORAL_TABLET | Freq: Every day | ORAL | Status: DC

## 2016-06-07 NOTE — Discharge Instructions (Signed)

## 2016-06-07 NOTE — ED Notes (Signed)
Pt c/o sore throat that started 5 days ago, denies any fever

## 2016-06-07 NOTE — ED Provider Notes (Signed)
CSN: [id number]     Arrival date & time 06/07/16  [id number] History   First MD Initiated Contact with Patient 06/07/16 [id number]     Chief Complaint  Patient presents with  . Sore Throat     (Consider location/radiation/quality/duration/timing/severity/associated sxs/prior Treatment) HPI Comments: Patient presents to the emergency department for evaluation of sore throat. Patient reports that symptoms began approximately 5 days ago. Patient reports persistent and constant throat pain that worsens when she swallows. She has not had any fever.  Patient is a 35 y.o. female presenting with pharyngitis.  Sore Throat    Past Medical History  Diagnosis Date  . Hypertension   . Sleep apnea    Past Surgical History  Procedure Laterality Date  . Ablation     History reviewed. No pertinent family history. Social History  Substance Use Topics  . Smoking status: Never Smoker   . Smokeless tobacco: Never Used  . Alcohol Use: Yes     Comment: occassionally   OB History    Gravida Para Term Preterm AB TAB SAB Ectopic Multiple Living   2 1 1  1  1   1      Review of Systems  HENT: Positive for sore throat.   All other systems reviewed and are negative.     Allergies  Review of patient's allergies indicates no known allergies.  Home Medications   Prior to Admission medications   Medication Sig Start Date End Date Taking? Authorizing Provider  naproxen (NAPROSYN) 250 MG tablet Take 1 tablet (250 mg total) by mouth 2 (two) times daily as needed for mild pain or moderate pain (take with food). 10/20/15   Francine Graven, DO   BP 137/102 mmHg  Pulse 80  Temp(Src) 98.3 F (36.8 C) (Oral)  Resp 16  Ht 4\' 11"  (1.499 m)  Wt 150 lb (68.04 kg)  BMI 30.28 kg/m2  SpO2 100%  LMP 05/17/2016 Physical Exam  Constitutional: She is oriented to person, place, and time. She appears well-developed and well-nourished. No distress.  HENT:  Head: Normocephalic and atraumatic.  Right Ear: Hearing  normal.  Left Ear: Hearing normal.  Nose: Nose normal.  Mouth/Throat: Mucous membranes are normal. Posterior oropharyngeal erythema present. No oropharyngeal exudate, posterior oropharyngeal edema or tonsillar abscesses.  Eyes: Conjunctivae and EOM are normal. Pupils are equal, round, and reactive to light.  Neck: Normal range of motion. Neck supple.  Cardiovascular: Regular rhythm, S1 normal and S2 normal.  Exam reveals no gallop and no friction rub.   No murmur heard. Pulmonary/Chest: Effort normal and breath sounds normal. No respiratory distress. She exhibits no tenderness.  Abdominal: Soft. Normal appearance and bowel sounds are normal. There is no hepatosplenomegaly. There is no tenderness. There is no rebound, no guarding, no tenderness at McBurney's point and negative Murphy's sign. No hernia.  Musculoskeletal: Normal range of motion.  Neurological: She is alert and oriented to person, place, and time. She has normal strength. No cranial nerve deficit or sensory deficit. Coordination normal. GCS eye subscore is 4. GCS verbal subscore is 5. GCS motor subscore is 6.  Skin: Skin is warm, dry and intact. No rash noted. No cyanosis.  Psychiatric: She has a normal mood and affect. Her speech is normal and behavior is normal. Thought content normal.  Nursing note and vitals reviewed.   ED Course  Procedures (including critical care time) Labs Review Labs Reviewed  RAPID STREP SCREEN (NOT AT [facility])  CULTURE, GROUP A STREP [facility])    Imaging  Review No results found. I have personally reviewed and evaluated these images and lab results as part of my medical decision-making.   EKG Interpretation None      MDM   Final diagnoses:  None  Pharyngitis  Rapid strep negative. Remainder of examination unremarkable. Treat with prednisone.    Orpah Greek, MD 06/07/16 [phone]

## 2016-06-10 LAB — CULTURE, GROUP A STREP (THRC)

## 2016-10-07 ENCOUNTER — Emergency Department (HOSPITAL_COMMUNITY)
Admission: EM | Admit: 2016-10-07 | Discharge: 2016-10-07 | Disposition: A | Discharge status: Home / Self Care | Payer: Medicaid Other | Attending: Emergency Medicine | Admitting: Emergency Medicine

## 2016-10-07 ENCOUNTER — Encounter (HOSPITAL_COMMUNITY): Payer: Self-pay

## 2016-10-07 DIAGNOSIS — Z79899 Other long term (current) drug therapy: Secondary | ICD-10-CM | POA: Diagnosis not present

## 2016-10-07 DIAGNOSIS — N939 Abnormal uterine and vaginal bleeding, unspecified: Secondary | ICD-10-CM | POA: Diagnosis not present

## 2016-10-07 DIAGNOSIS — I10 Essential (primary) hypertension: Secondary | ICD-10-CM | POA: Diagnosis not present

## 2016-10-07 DIAGNOSIS — F1290 Cannabis use, unspecified, uncomplicated: Secondary | ICD-10-CM | POA: Insufficient documentation

## 2016-10-07 LAB — URINALYSIS, ROUTINE W REFLEX MICROSCOPIC
BILIRUBIN URINE: NEGATIVE
GLUCOSE, UA: 250 mg/dL — AB
Ketones, ur: NEGATIVE mg/dL
Leukocytes, UA: NEGATIVE
NITRITE: NEGATIVE
PH: 5.5 (ref 5.0–8.0)
Protein, ur: NEGATIVE mg/dL

## 2016-10-07 LAB — URINE MICROSCOPIC-ADD ON

## 2016-10-07 LAB — PREGNANCY, URINE: Preg Test, Ur: NEGATIVE

## 2016-10-07 NOTE — ED Triage Notes (Signed)
Pt reports she started having vaginal bleeding yesterday and initially it was heavy then stopped.  Last night she started cramping and bleeding started again.  Reports her LMP was Oct 18.  Pt says this is heavier than usual periods but is not soaking through pads.

## 2016-10-07 NOTE — Discharge Instructions (Signed)
Make appointment to follow-up with OB/GYN. Referral information provided above. Return for any new or worse symptoms. Return for feeling like you pass out.  Pregnancy test was negative.

## 2016-10-07 NOTE — ED Provider Notes (Signed)
[facility] DEPT Provider Note   CSN: [id number] Arrival date & time: 10/07/16  1529     History   Chief Complaint Chief Complaint  Patient presents with  . Vaginal Bleeding    HPI Debra Tanner is a 35 y.o. female.   Patient's last menstrual period was October 19. Patient presents with heavy vaginal bleeding with some clots. No abdominal pain no syncope no lightheadedness. No nausea no vomiting. Patient is not sure whether she is pregnant or not.      Past Medical History:  Diagnosis Date  . Hypertension   . Sleep apnea     There are no active problems to display for this patient.   Past Surgical History:  Procedure Laterality Date  . ABLATION      OB History    Gravida Para Term Preterm AB Living   2 1 1   1 1    SAB TAB Ectopic Multiple Live Births   1               Home Medications    Prior to Admission medications   Medication Sig Start Date End Date Taking? Authorizing Provider  acetaminophen (TYLENOL) 500 MG tablet Take 1,000 mg by mouth every 6 (six) hours as needed.   Yes Historical Provider, MD  loratadine (CLARITIN) 10 MG tablet Take 1 tablet (10 mg total) by mouth daily. One po daily x 5 days Patient not taking: Reported on 10/07/2016 06/07/16   Orpah Greek, MD  predniSONE (DELTASONE) 20 MG tablet Take 2 tablets (40 mg total) by mouth daily with breakfast. Patient not taking: Reported on 10/07/2016 06/07/16   Orpah Greek, MD    Family History No family history on file.  Social History Social History  Substance Use Topics  . Smoking status: Never Smoker  . Smokeless tobacco: Never Used  . Alcohol use Yes     Comment: occassionally     Allergies   Patient has no known allergies.   Review of Systems Review of Systems  Constitutional: Negative for fever.  HENT: Negative for congestion.   Eyes: Negative for visual disturbance.  Respiratory: Negative for shortness of breath.   Cardiovascular: Negative for  chest pain.  Gastrointestinal: Negative for abdominal pain, nausea and vomiting.  Genitourinary: Positive for menstrual problem and vaginal bleeding. Negative for dysuria and vaginal discharge.  Musculoskeletal: Negative for back pain.  Skin: Negative for rash.  Neurological: Negative for syncope and light-headedness.  Hematological: Does not bruise/bleed easily.  Psychiatric/Behavioral: Negative for confusion.     Physical Exam Updated Vital Signs BP 121/68 (BP Location: Right Arm)   Pulse 66   Temp 98 F (36.7 C) (Oral)   Resp 18   Ht 4\' 11"  (1.499 m)   Wt 68 kg   LMP 09/09/2016   SpO2 100%   BMI 30.30 kg/m   Physical Exam  Constitutional: She is oriented to person, place, and time. She appears well-developed and well-nourished. No distress.  HENT:  Head: Normocephalic and atraumatic.  Mouth/Throat: Oropharynx is clear and moist.  Eyes: EOM are normal. Pupils are equal, round, and reactive to light.  Neck: Normal range of motion. Neck supple.  Cardiovascular: Normal rate and regular rhythm.   Pulmonary/Chest: Effort normal and breath sounds normal. No respiratory distress.  Abdominal: Soft. Bowel sounds are normal. There is no tenderness.  Genitourinary: Uterus normal. No vaginal discharge found.  Genitourinary Comments: Normal external genitalia. Vaginal vault with dark blood. And some clots. No excessive  bleeding. No cervical motion tenderness. Uterus nontender adnexa nontender.  Neurological: She is alert and oriented to person, place, and time. No cranial nerve deficit or sensory deficit. She exhibits normal muscle tone. Coordination normal.  Skin: Skin is warm.  Nursing note and vitals reviewed.    ED Treatments / Results  Labs (all labs ordered are listed, but only abnormal results are displayed) Labs Reviewed  URINALYSIS, ROUTINE W REFLEX MICROSCOPIC (NOT AT [facility]) - Abnormal; Notable for the following:       Result Value   Specific Gravity, Urine >1.030 (*)      Glucose, UA 250 (*)    Hgb urine dipstick SMALL (*)    All other components within normal limits  URINE MICROSCOPIC-ADD ON - Abnormal; Notable for the following:    Squamous Epithelial / LPF 0-5 (*)    Bacteria, UA RARE (*)    All other components within normal limits  PREGNANCY, URINE  RPR  HIV ANTIBODY (ROUTINE TESTING)  GC/CHLAMYDIA PROBE AMP ([place]) NOT AT [facility]    EKG  EKG Interpretation None       Radiology No results found.  Procedures Procedures (including critical care time)  Medications Ordered in ED Medications - No data to display   Initial Impression / Assessment and Plan / ED Course  I have reviewed the triage vital signs and the nursing notes.  Pertinent labs & imaging results that were available during my care of the patient were reviewed by me and considered in my medical decision making (see chart for details).  Clinical Course      Patient's last menstrual period was October 19.Patient with heavy vaginal bleeding and clots that started yesterday. Patient has some concern about being  Pregnant. See test here was negative. Pelvic exam showed the dark blood with some clots no excessive bleeding. Cervix nontender uterus nontender adnexa nontender. Culture sent and pending. Patient given referral for follow-up with [facility].  Final Clinical Impressions(s) / ED Diagnoses   Final diagnoses:  Vaginal bleeding    New Prescriptions New Prescriptions   No medications on file     [name], MD 10/07/16 [phone]

## 2016-10-08 LAB — RPR: RPR Ser Ql: NONREACTIVE

## 2016-10-08 LAB — HIV ANTIBODY (ROUTINE TESTING W REFLEX): HIV Screen 4th Generation wRfx: NONREACTIVE

## 2016-10-08 LAB — GC/CHLAMYDIA PROBE AMP (~~LOC~~) NOT AT ARMC
Chlamydia: NEGATIVE
Neisseria Gonorrhea: NEGATIVE

## 2017-02-25 ENCOUNTER — Emergency Department (HOSPITAL_COMMUNITY)
Admission: EM | Admit: 2017-02-25 | Discharge: 2017-02-25 | Disposition: A | Discharge status: Home / Self Care | Payer: Medicaid Other | Attending: Emergency Medicine | Admitting: Emergency Medicine

## 2017-02-25 ENCOUNTER — Encounter (HOSPITAL_COMMUNITY): Payer: Self-pay | Admitting: Emergency Medicine

## 2017-02-25 DIAGNOSIS — I10 Essential (primary) hypertension: Secondary | ICD-10-CM | POA: Diagnosis not present

## 2017-02-25 DIAGNOSIS — R739 Hyperglycemia, unspecified: Secondary | ICD-10-CM

## 2017-02-25 DIAGNOSIS — N898 Other specified noninflammatory disorders of vagina: Secondary | ICD-10-CM | POA: Diagnosis present

## 2017-02-25 DIAGNOSIS — B9689 Other specified bacterial agents as the cause of diseases classified elsewhere: Secondary | ICD-10-CM

## 2017-02-25 DIAGNOSIS — N760 Acute vaginitis: Secondary | ICD-10-CM | POA: Insufficient documentation

## 2017-02-25 LAB — WET PREP, GENITAL
SPERM: NONE SEEN
TRICH WET PREP: NONE SEEN
Yeast Wet Prep HPF POC: NONE SEEN

## 2017-02-25 LAB — COMPREHENSIVE METABOLIC PANEL
ALT: 23 U/L (ref 14–54)
ANION GAP: 7 (ref 5–15)
AST: 25 U/L (ref 15–41)
Albumin: 3.5 g/dL (ref 3.5–5.0)
Alkaline Phosphatase: 93 U/L (ref 38–126)
BUN: 8 mg/dL (ref 6–20)
CHLORIDE: 105 mmol/L (ref 101–111)
CO2: 23 mmol/L (ref 22–32)
Calcium: 8.6 mg/dL — ABNORMAL LOW (ref 8.9–10.3)
Creatinine, Ser: 0.57 mg/dL (ref 0.44–1.00)
Glucose, Bld: 315 mg/dL — ABNORMAL HIGH (ref 65–99)
POTASSIUM: 3.8 mmol/L (ref 3.5–5.1)
Sodium: 135 mmol/L (ref 135–145)
Total Bilirubin: 1.5 mg/dL — ABNORMAL HIGH (ref 0.3–1.2)
Total Protein: 7.1 g/dL (ref 6.5–8.1)

## 2017-02-25 LAB — URINALYSIS, ROUTINE W REFLEX MICROSCOPIC
Bilirubin Urine: NEGATIVE
HGB URINE DIPSTICK: NEGATIVE
KETONES UR: 80 mg/dL — AB
NITRITE: NEGATIVE
Protein, ur: NEGATIVE mg/dL
Specific Gravity, Urine: 1.037 — ABNORMAL HIGH (ref 1.005–1.030)
pH: 6 (ref 5.0–8.0)

## 2017-02-25 LAB — CBC WITH DIFFERENTIAL/PLATELET
BASOS ABS: 0.1 10*3/uL (ref 0.0–0.1)
Basophils Relative: 1 %
EOS ABS: 0.1 10*3/uL (ref 0.0–0.7)
Eosinophils Relative: 1 %
HCT: 35.2 % — ABNORMAL LOW (ref 36.0–46.0)
Hemoglobin: 11.1 g/dL — ABNORMAL LOW (ref 12.0–15.0)
LYMPHS ABS: 3.3 10*3/uL (ref 0.7–4.0)
Lymphocytes Relative: 30 %
MCH: 26.7 pg (ref 26.0–34.0)
MCHC: 31.5 g/dL (ref 30.0–36.0)
MCV: 84.8 fL (ref 78.0–100.0)
Monocytes Absolute: 0.8 10*3/uL (ref 0.1–1.0)
Monocytes Relative: 7 %
NEUTROS ABS: 6.9 10*3/uL (ref 1.7–7.7)
NEUTROS PCT: 61 %
PLATELETS: 374 10*3/uL (ref 150–400)
RBC: 4.15 MIL/uL (ref 3.87–5.11)
RDW: 14.6 % (ref 11.5–15.5)
WBC: 11.2 10*3/uL — AB (ref 4.0–10.5)

## 2017-02-25 LAB — PREGNANCY, URINE: Preg Test, Ur: NEGATIVE

## 2017-02-25 LAB — CBG MONITORING, ED: GLUCOSE-CAPILLARY: 298 mg/dL — AB (ref 65–99)

## 2017-02-25 MED ORDER — METRONIDAZOLE 500 MG PO TABS: 500.0000 mg | ORAL_TABLET | Freq: Two times a day (BID) | ORAL | 0 refills | Status: DC

## 2017-02-25 NOTE — Discharge Instructions (Signed)
Please read instructions below. Talk with your primary care provider about any new medications. Do not drink alcohol while taking Metronidazole (Flagyl) as it will make you vomit. Schedule an appointment with primary care to follow up on your high blood sugar and high blood pressure. Return to ER for fever or new or worsening symptoms.

## 2017-02-25 NOTE — ED Triage Notes (Signed)
PT c/o increased urinary frequency with some discolored vaginal discharge and itching x4 days.

## 2017-02-25 NOTE — ED Provider Notes (Signed)
[facility] DEPT Provider Note   CSN: [id number] Arrival date & time: 02/25/17  7939     History   Chief Complaint Chief Complaint  Patient presents with  . Vaginal Discharge    HPI Debra Tanner is a 36 y.o. female.  Pt w hx HTN, presents w 2 weeks increased urinary frequency and vaginal itching. She states she has increased thirst x2weeks and some fatigue and episodes of nausea. Reports intermittent burning w urination, external vaginal itching and inc non-malodorous vaginal discharge. Denies hematuria, abd or flank pain, V/D/C, F/C. Pt reports LMP March 21. Does not take OCPs. She reports she drank some kool-aid this morning, but has not eaten.      Past Medical History:  Diagnosis Date  . Hypertension   . Sleep apnea     There are no active problems to display for this patient.   Past Surgical History:  Procedure Laterality Date  . ABLATION      OB History    Gravida Para Term Preterm AB Living   2 1 1   1 1    SAB TAB Ectopic Multiple Live Births   1               Home Medications    Prior to Admission medications   Medication Sig Start Date End Date Taking? Authorizing Provider  acetaminophen (TYLENOL) 500 MG tablet Take 1,000 mg by mouth every 6 (six) hours as needed.   Yes Historical Provider, MD  metroNIDAZOLE (FLAGYL) 500 MG tablet Take 1 tablet (500 mg total) by mouth 2 (two) times daily. 02/25/17   [place] N Russo, PA-C    Family History History reviewed. No pertinent family history.  Social History Social History  Substance Use Topics  . Smoking status: Never Smoker  . Smokeless tobacco: Never Used  . Alcohol use Yes     Comment: occassionally     Allergies   Patient has no known allergies.   Review of Systems Review of Systems  Constitutional: Positive for fatigue. Negative for chills and fever.  Gastrointestinal: Positive for nausea. Negative for constipation, diarrhea and vomiting.  Endocrine: Positive for polydipsia.    Genitourinary: Positive for dysuria, frequency and vaginal discharge. Negative for flank pain and hematuria.       Vaginal itching  Musculoskeletal: Negative for back pain.  Skin: Negative for rash.  All other systems reviewed and are negative.    Physical Exam Updated Vital Signs BP (!) 139/96 (BP Location: Right Arm)   Pulse 84   Temp 98.9 F (37.2 C) (Oral)   Resp 18   Ht 4\' 11"  (1.499 m)   Wt 68 kg   LMP 02/11/2017   SpO2 100%   BMI 30.30 kg/m   Physical Exam  Constitutional: She is oriented to person, place, and time. She appears well-developed and well-nourished.  HENT:  Head: Normocephalic and atraumatic.  Eyes: Conjunctivae are normal.  Neck: Normal range of motion.  Cardiovascular: Normal rate, regular rhythm, normal heart sounds and intact distal pulses.   No murmur heard. Pulmonary/Chest: Effort normal and breath sounds normal. No respiratory distress. She has no wheezes. She has no rales.  Abdominal: Soft. Bowel sounds are normal. She exhibits no distension. There is no tenderness. There is no guarding.  Genitourinary: Uterus normal. Cervix exhibits discharge and friability. Right adnexum displays no tenderness. Left adnexum displays no tenderness. There is erythema (vaginal walls mildly erythematous) in the vagina. No tenderness in the vagina. Vaginal discharge (copious white discharge)  found.  Genitourinary Comments: Labia minora w mild erythema. No rashes or lesions. Nontender  Neurological: She is alert and oriented to person, place, and time.  Skin: Skin is warm and dry.  Psychiatric: She has a normal mood and affect. Her behavior is normal.  Nursing note and vitals reviewed.    ED Treatments / Results  Labs (all labs ordered are listed, but only abnormal results are displayed) Labs Reviewed  WET PREP, GENITAL - Abnormal; Notable for the following:       Result Value   Clue Cells Wet Prep HPF POC PRESENT (*)    WBC, Wet Prep HPF POC FEW (*)    All  other components within normal limits  URINALYSIS, ROUTINE W REFLEX MICROSCOPIC - Abnormal; Notable for the following:    APPearance HAZY (*)    Specific Gravity, Urine 1.037 (*)    Glucose, UA >=500 (*)    Ketones, ur 80 (*)    Leukocytes, UA TRACE (*)    Bacteria, UA RARE (*)    Squamous Epithelial / LPF 0-5 (*)    All other components within normal limits  COMPREHENSIVE METABOLIC PANEL - Abnormal; Notable for the following:    Glucose, Bld 315 (*)    Calcium 8.6 (*)    Total Bilirubin 1.5 (*)    All other components within normal limits  CBC WITH DIFFERENTIAL/PLATELET - Abnormal; Notable for the following:    WBC 11.2 (*)    Hemoglobin 11.1 (*)    HCT 35.2 (*)    All other components within normal limits  CBG MONITORING, ED - Abnormal; Notable for the following:    Glucose-Capillary 298 (*)    All other components within normal limits  PREGNANCY, URINE  HEMOGLOBIN A1C  GC/CHLAMYDIA PROBE AMP ([place]) NOT AT [facility]    EKG  EKG Interpretation None       Radiology No results found.  Procedures Procedures (including critical care time)  Medications Ordered in ED Medications - No data to display   Initial Impression / Assessment and Plan / ED Course  I have reviewed the triage vital signs and the nursing notes.  Pertinent labs & imaging results that were available during my care of the patient were reviewed by me and considered in my medical decision making (see chart for details).     Pt w BV, hyperglycemia and HTN. Pelvic exam showed friable cervix and copious discharge; wet prep w clue cells, GC/Chlamydia pending, U/A reassuring, neg urine preg. CBG 298 though not true fasting; pt drank 1/2 cup of koolaid this morning. Concern for T2DM; pt w polydipsia, elev urine glucose, inc urinary freq, fatigue. Screening done: Hba1c pending, CMP w elev glucose otherwise reassuring, CBC reassuring. Pt also w elev BP; not on medication. Will discharge w Flagyl for BV.  Encouraged PCP follow up for HTN control and hyperglycemia; instructed pt to be fasting for 8 hrs before her appt.Pt well-appearing, afebrile, nontoxic; safe for discharge home.  Patient discussed with Dr. Sabra Heck. Discussed results, findings, treatment and follow up. Patient advised of return precautions. Patient verbalized understanding and agreed with plan.    Final Clinical Impressions(s) / ED Diagnoses   Final diagnoses:  Bacterial vaginosis  Hyperglycemia  Essential hypertension    New Prescriptions New Prescriptions   METRONIDAZOLE (FLAGYL) 500 MG TABLET    Take 1 tablet (500 mg total) by mouth 2 (two) times daily.     [place] N Russo, PA-C 02/25/17 1110    [place] N  Virgina Jock, PA-C 02/25/17 [place], MD 02/26/17 [phone]

## 2017-02-26 LAB — GC/CHLAMYDIA PROBE AMP (~~LOC~~) NOT AT ARMC
CHLAMYDIA, DNA PROBE: NEGATIVE
Neisseria Gonorrhea: NEGATIVE

## 2017-02-26 LAB — HEMOGLOBIN A1C
Hgb A1c MFr Bld: 10.4 % — ABNORMAL HIGH (ref 4.8–5.6)
Mean Plasma Glucose: 252 mg/dL

## 2017-02-26 NOTE — ED Notes (Signed)
Sent Pt.s Abnormal lab value, Hgb A1c to her PCP Dr. Tula Nakayama.  Called and left a message for the pt to return call .   Bjorn Loser, RN 02/26/2017

## 2017-03-01 ENCOUNTER — Encounter (HOSPITAL_COMMUNITY): Payer: Self-pay | Admitting: *Deleted

## 2017-03-01 ENCOUNTER — Emergency Department (HOSPITAL_COMMUNITY)
Admission: EM | Admit: 2017-03-01 | Discharge: 2017-03-01 | Disposition: A | Discharge status: Home / Self Care | Payer: Medicaid Other | Attending: Emergency Medicine | Admitting: Emergency Medicine

## 2017-03-01 DIAGNOSIS — R739 Hyperglycemia, unspecified: Secondary | ICD-10-CM

## 2017-03-01 DIAGNOSIS — Z7984 Long term (current) use of oral hypoglycemic drugs: Secondary | ICD-10-CM | POA: Diagnosis not present

## 2017-03-01 DIAGNOSIS — I10 Essential (primary) hypertension: Secondary | ICD-10-CM | POA: Diagnosis not present

## 2017-03-01 DIAGNOSIS — E1165 Type 2 diabetes mellitus with hyperglycemia: Secondary | ICD-10-CM | POA: Insufficient documentation

## 2017-03-01 LAB — BASIC METABOLIC PANEL
ANION GAP: 12 (ref 5–15)
BUN: 10 mg/dL (ref 6–20)
CALCIUM: 9.5 mg/dL (ref 8.9–10.3)
CO2: 20 mmol/L — ABNORMAL LOW (ref 22–32)
Chloride: 97 mmol/L — ABNORMAL LOW (ref 101–111)
Creatinine, Ser: 0.66 mg/dL (ref 0.44–1.00)
GFR calc Af Amer: >60 mL/min (ref >60)
GFR calc non Af Amer: >60 mL/min (ref >60)
GLUCOSE: 470 mg/dL — AB (ref 65–99)
POTASSIUM: 3.7 mmol/L (ref 3.5–5.1)
Sodium: 129 mmol/L — ABNORMAL LOW (ref 135–145)

## 2017-03-01 LAB — BLOOD GAS, VENOUS
ACID-BASE DEFICIT: 3 mmol/L — AB (ref 0.0–2.0)
BICARBONATE: 21.8 mmol/L (ref 20.0–28.0)
FIO2: 21
O2 Saturation: 81.9 %
PATIENT TEMPERATURE: 37
PH VEN: 7.388 (ref 7.250–7.430)
pCO2, Ven: 35.8 mmHg — ABNORMAL LOW (ref 44.0–60.0)
pO2, Ven: 49.6 mmHg — ABNORMAL HIGH (ref 32.0–45.0)

## 2017-03-01 LAB — CBC
HCT: 35.8 % — ABNORMAL LOW (ref 36.0–46.0)
Hemoglobin: 11.7 g/dL — ABNORMAL LOW (ref 12.0–15.0)
MCH: 28.4 pg (ref 26.0–34.0)
MCHC: 32.7 g/dL (ref 30.0–36.0)
MCV: 86.9 fL (ref 78.0–100.0)
PLATELETS: 361 10*3/uL (ref 150–400)
RBC: 4.12 MIL/uL (ref 3.87–5.11)
RDW: 17.6 % — AB (ref 11.5–15.5)
WBC: 14.9 10*3/uL — AB (ref 4.0–10.5)

## 2017-03-01 LAB — CBG MONITORING, ED
GLUCOSE-CAPILLARY: 449 mg/dL — AB (ref 65–99)
GLUCOSE-CAPILLARY: 317 mg/dL — AB (ref 65–99)

## 2017-03-01 MED ORDER — METFORMIN HCL 500 MG PO TABS
500.0000 mg | ORAL_TABLET | Freq: Once | ORAL | Status: AC
  Administered 2017-03-01: 500 mg via ORAL
  Filled 2017-03-01: qty 1

## 2017-03-01 MED ORDER — METFORMIN HCL 500 MG PO TABS: 500.0000 mg | ORAL_TABLET | Freq: Two times a day (BID) | ORAL | 1 refills | Status: DC

## 2017-03-01 MED ORDER — SODIUM CHLORIDE 0.9 % IV BOLUS (SEPSIS)
1000.0000 mL | Freq: Once | INTRAVENOUS | Status: AC
  Administered 2017-03-01: 1000 mL via INTRAVENOUS

## 2017-03-01 MED ORDER — INSULIN ASPART 100 UNIT/ML ~~LOC~~ SOLN
6.0000 [IU] | Freq: Once | SUBCUTANEOUS | Status: AC
Start: 2017-03-01 — End: 2017-03-01
  Administered 2017-03-01: 6 [IU] via SUBCUTANEOUS
  Filled 2017-03-01: qty 1

## 2017-03-01 NOTE — ED Triage Notes (Signed)
Pt comes in for elevated CBG. Her at home readings having been 560 today. Pt states she has generalized fatigue feeling and polyuria. Pt alert and oriented x4 in triage.

## 2017-03-01 NOTE — ED Notes (Signed)
Pt made aware to return if symptoms worsen or if any life threatening symptoms occur.   

## 2017-03-01 NOTE — ED Provider Notes (Signed)
[facility] Provider [facility]   CSN: [id number] Arrival date & time: 03/01/17  1544     History   Chief Complaint Chief Complaint  Patient presents with  . Hyperglycemia    HPI Debra Tanner is a 36 y.o. female.  HPI Patient was recently seen on the emergency room on April 5. She presented to the emergency for evaluation of vaginal discharge. While she was in the emergency room she had blood tests.  She had a glucose of 215 and was told that she was a diabetic. Patient was instructed to follow-up with her primary care doctor. She was not started on any new medications.  She has an appointment this Friday but her blood sugars were running up to the 500s today.   The patient has not been following a diabetic diet yet. She is not sure what she is supposed to do. Patient states that when she is thirsty she drinks Kool-Aid, Hawaiian Punch, sweet tea, or soda. She continues to urinate a lot and is thirsty.  Patient denies any abdominal pain. No vomiting. Denies any other complaints. Past Medical History:  Diagnosis Date  . Hypertension   . Sleep apnea     There are no active problems to display for this patient.   Past Surgical History:  Procedure Laterality Date  . ABLATION      OB History    Gravida Para Term Preterm AB Living   2 1 1   1 1    SAB TAB Ectopic Multiple Live Births   1               Home Medications    Prior to Admission medications   Medication Sig Start Date End Date Taking? Authorizing Provider  acetaminophen (TYLENOL) 500 MG tablet Take 1,000 mg by mouth every 6 (six) hours as needed.   Yes Historical Provider, MD  metroNIDAZOLE (FLAGYL) 500 MG tablet Take 1 tablet (500 mg total) by mouth 2 (two) times daily. 02/25/17  Yes [place] N Russo, PA-C  metFORMIN (GLUCOPHAGE) 500 MG tablet Take 1 tablet (500 mg total) by mouth 2 (two) times daily with a meal. 03/01/17   Dorie Rank, MD    Family History No family history on file.  Social History Social  History  Substance Use Topics  . Smoking status: Never Smoker  . Smokeless tobacco: Never Used  . Alcohol use Yes     Comment: occassionally     Allergies   Patient has no known allergies.   Review of Systems Review of Systems  All other systems reviewed and are negative.    Physical Exam Updated Vital Signs BP 121/82   Pulse 91   Temp 98.3 F (36.8 C) (Oral)   Resp 16   Ht 4\' 11"  (1.499 m)   Wt 68 kg   LMP 02/11/2017   SpO2 100%   BMI 30.30 kg/m   Physical Exam  Constitutional: She appears well-developed and well-nourished. No distress.  HENT:  Head: Normocephalic and atraumatic.  Right Ear: External ear normal.  Left Ear: External ear normal.  Eyes: Conjunctivae are normal. Right eye exhibits no discharge. Left eye exhibits no discharge. No scleral icterus.  Neck: Neck supple. No tracheal deviation present.  Cardiovascular: Normal rate, regular rhythm and intact distal pulses.   Pulmonary/Chest: Effort normal and breath sounds normal. No stridor. No respiratory distress. She has no wheezes. She has no rales.  Abdominal: Soft. Bowel sounds are normal. She exhibits no distension. There is no  tenderness. There is no rebound and no guarding.  Musculoskeletal: She exhibits no edema or tenderness.  Neurological: She is alert. She has normal strength. No cranial nerve deficit (no facial droop, extraocular movements intact, no slurred speech) or sensory deficit. She exhibits normal muscle tone. She displays no seizure activity. Coordination normal.  Skin: Skin is warm and dry. No rash noted.  Psychiatric: She has a normal mood and affect.  Nursing note and vitals reviewed.    ED Treatments / Results  Labs (all labs ordered are listed, but only abnormal results are displayed) Labs Reviewed  CBC - Abnormal; Notable for the following:       Result Value   WBC 14.9 (*)    Hemoglobin 11.7 (*)    HCT 35.8 (*)    RDW 17.6 (*)    All other components within normal  limits  BASIC METABOLIC PANEL - Abnormal; Notable for the following:    Sodium 129 (*)    Chloride 97 (*)    CO2 20 (*)    Glucose, Bld 470 (*)    All other components within normal limits  BLOOD GAS, VENOUS - Abnormal; Notable for the following:    pCO2, Ven 35.8 (*)    pO2, Ven 49.6 (*)    Acid-base deficit 3.0 (*)    All other components within normal limits  CBG MONITORING, ED - Abnormal; Notable for the following:    Glucose-Capillary 449 (*)    All other components within normal limits  CBG MONITORING, ED - Abnormal; Notable for the following:    Glucose-Capillary 317 (*)    All other components within normal limits     Radiology No results found.  Procedures Procedures (including critical care time)  Medications Ordered in ED Medications  metFORMIN (GLUCOPHAGE) tablet 500 mg (not administered)  sodium chloride 0.9 % bolus 1,000 mL (0 mLs Intravenous Stopped 03/01/17 1834)  insulin aspart (novoLOG) injection 6 Units (6 Units Subcutaneous Given 03/01/17 1705)     Initial Impression / Assessment and Plan / ED Course  I have reviewed the triage vital signs and the nursing notes.  Pertinent labs & imaging results that were available during my care of the patient were reviewed by me and considered in my medical decision making (see chart for details).  Clinical Course as of Mar 02 1839  Mon Mar 01, 2017  1830 Blood sugar is improving with treatment.  No signs of DKA.  Discussed diet extensively with patient.  She first needs to cut out sugary drinks.  [JK]    Clinical Course User Index [JK] Dorie Rank, MD    Patient's blood sugar improved with treatment. After fluids and a dose of insulin down in the 300s. I had a long discussion with the patient about following a diabetic diet. She is not been monitoring her carbohydrate intake whatsoever.  I will give her a prescription for metformin. She does have plans to follow up with primary care doctor later this week.  Final  Clinical Impressions(s) / ED Diagnoses   Final diagnoses:  Hyperglycemia    New Prescriptions New Prescriptions   METFORMIN (GLUCOPHAGE) 500 MG TABLET    Take 1 tablet (500 mg total) by mouth 2 (two) times daily with a meal.     Dorie Rank, MD 03/01/17 1840

## 2017-03-01 NOTE — Discharge Instructions (Signed)
Make sure to follow a diabetic diet, start taking the metformin medication to help control her blood sugar, follow-up with your primary doctor this coming week to discuss further treatment

## 2017-03-05 ENCOUNTER — Ambulatory Visit (INDEPENDENT_AMBULATORY_CARE_PROVIDER_SITE_OTHER): Payer: Medicaid Other | Admitting: Family Medicine

## 2017-03-05 ENCOUNTER — Encounter: Payer: Self-pay | Admitting: Family Medicine

## 2017-03-05 VITALS — BP 118/62 | HR 80 | Temp 98.1°F | Resp 16 | Ht 59.0 in | Wt 150.0 lb

## 2017-03-05 DIAGNOSIS — N760 Acute vaginitis: Secondary | ICD-10-CM

## 2017-03-05 DIAGNOSIS — E119 Type 2 diabetes mellitus without complications: Secondary | ICD-10-CM

## 2017-03-05 DIAGNOSIS — Z87898 Personal history of other specified conditions: Secondary | ICD-10-CM | POA: Diagnosis not present

## 2017-03-05 DIAGNOSIS — F411 Generalized anxiety disorder: Secondary | ICD-10-CM | POA: Diagnosis not present

## 2017-03-05 DIAGNOSIS — B9689 Other specified bacterial agents as the cause of diseases classified elsewhere: Secondary | ICD-10-CM | POA: Diagnosis not present

## 2017-03-05 DIAGNOSIS — E669 Obesity, unspecified: Secondary | ICD-10-CM

## 2017-03-05 LAB — POCT CBG (FASTING - GLUCOSE)-MANUAL ENTRY: Glucose Fasting, POC: 273 mg/dL — AB (ref 70–99)

## 2017-03-05 MED ORDER — METRONIDAZOLE 0.75 % VA GEL: 1.0000 | Freq: Two times a day (BID) | VAGINAL | 0 refills | Status: DC

## 2017-03-05 MED ORDER — SERTRALINE HCL 25 MG PO TABS: 25.0000 mg | ORAL_TABLET | Freq: Every day | ORAL | 2 refills | Status: DC

## 2017-03-05 NOTE — Progress Notes (Signed)
Chief Complaint  Patient presents with  . Establish Care   This is a 36 year old woman who lives with her grandmother. She has a learning disorder and anxiety, and is considered disabled. She has a 38 year old son. Her grandmother raised her from the age of 2. Grandmother is here with her at the appointment. Prior care through the health department. These records will be requested. Patient went to the emergency room for a vaginal infection. She was found to have hyperglycemia. She went back a few days later with worsening sugars. Hemoglobin A1c was found to be 10.4. She was started on metformin 500 mg twice a day with food. Her sugars are slowly coming down. Because of her learning disability and mental impairment, she doesn't know what to eat or drink. Going to schedule her with the diabetes educator. Her grandmother helps her somewhat, but is gone much of the day caring for her own mother who is 40 years old. I did go over the basics of stopping sweets, sweetened drinks, cake candy cookies. Reduce carbohydrates recommended. No fried foods recommended. No alcohol recommended. Written information is given to her. She is told that she needs to take a walk twice a day when the weather is nice. Her healthcare appears to be up-to-date. She states she had a Pap smear here ago. She had a tetanus shot 3 or 4 years ago. Unknown flu status. She is a history of an anxiety disorder. She is not currently taking medications. She would like to be back on medicines. She doesn't recall the name of any of her medications, but hasn't old pill bottle from 2015 for hydroxyzine to take when necessary. I plan to start her on sertraline, and taper to appropriate dose. She took metronidazole that was prescribed by the emergency room physician. She still has some vaginal irritation. I will give her metronidazole gel to try. She sees Dr. Glo Herring in [place]. She will be referred back to him if she has continued symptoms.  Patient  Active Problem List   Diagnosis Date Noted  . Diabetes mellitus without complication ([place]) 97/98/9211  . GAD (generalized anxiety disorder) 03/05/2017  . History of learning disability 03/05/2017  . Obesity (BMI 30-39.9) 03/05/2017    Outpatient Encounter Prescriptions as of 03/05/2017  Medication Sig  . acetaminophen (TYLENOL) 500 MG tablet Take 1,000 mg by mouth every 6 (six) hours as needed.  . metFORMIN (GLUCOPHAGE) 500 MG tablet Take 1 tablet (500 mg total) by mouth 2 (two) times daily with a meal.  . metroNIDAZOLE (METROGEL VAGINAL) 0.75 % vaginal gel Place 1 Applicatorful vaginally 2 (two) times daily.  . sertraline (ZOLOFT) 25 MG tablet Take 1 tablet (25 mg total) by mouth daily.   No facility-administered encounter medications on file as of 03/05/2017.     Past Medical History:  Diagnosis Date  . Anemia   . Depression   . Diabetes mellitus without complication ([place])   . Hypertension     Past Surgical History:  Procedure Laterality Date  . ABLATION      Social History   Social History  . Marital status: Single    Spouse name: N/A  . Number of children: 1  . Years of education: 38   Occupational History  . disabled     Mental   Social History Main Topics  . Smoking status: Never Smoker  . Smokeless tobacco: Never Used  . Alcohol use Yes     Comment: occassionally  . Drug use: Yes  Types: Marijuana     Comment: over 6 months ago  . Sexual activity: Yes    Birth control/ protection: None   Other Topics Concern  . Not on file   Social History Narrative   Raised by grandmother   Lives with grandmother and son   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    Family History  Problem Relation Age of Onset  . Alcohol abuse Mother   . Cirrhosis Mother   . Alcohol abuse Father   . Heart disease Father   . Hypertension Father   . Diabetes Sister   . Sickle cell trait Sister   . Hypertension Maternal Grandmother   . Hyperlipidemia  Maternal Grandmother   . Cancer Maternal Grandfather     lung    Review of Systems  Constitutional: Positive for malaise/fatigue. Negative for chills, fever and weight loss.  HENT: Negative for congestion and hearing loss.   Eyes: Negative for blurred vision and pain.  Respiratory: Negative for cough and shortness of breath.   Cardiovascular: Negative for chest pain and leg swelling.  Gastrointestinal: Negative for abdominal pain, constipation, diarrhea and heartburn.  Genitourinary: Positive for frequency. Negative for dysuria.       Vaginal irritation  Musculoskeletal: Negative for falls, joint pain and myalgias.  Neurological: Negative for dizziness, seizures and headaches.  Psychiatric/Behavioral: Negative for depression. The patient is nervous/anxious. The patient does not have insomnia.     BP 118/62 (BP Location: Right Arm, Patient Position: Sitting, Cuff Size: Normal)   Pulse 80   Temp 98.1 F (36.7 C) (Temporal)   Resp 16   Ht 4\' 11"  (1.499 m)   Wt 150 lb (68 kg)   LMP 02/11/2017 (Exact Date)   SpO2 100%   BMI 30.30 kg/m   Physical Exam  Constitutional: She is oriented to person, place, and time. She appears well-developed and well-nourished.  HENT:  Head: Normocephalic and atraumatic.  Right Ear: External ear normal.  Left Ear: External ear normal.  Mouth/Throat: Oropharynx is clear and moist.  Eyes: Conjunctivae are normal. Pupils are equal, round, and reactive to light.  Wears glasses.  Neck: Normal range of motion. Neck supple. No thyromegaly present.  Cardiovascular: Normal rate, regular rhythm and normal heart sounds.   Pulmonary/Chest: Effort normal and breath sounds normal. No respiratory distress.  Abdominal: Soft. Bowel sounds are normal.  Musculoskeletal: Normal range of motion. She exhibits no edema.  Lymphadenopathy:    She has no cervical adenopathy.  Neurological: She is alert and oriented to person, place, and time.  Gait normal  Skin: Skin is  warm and dry.  Psychiatric: She has a normal mood and affect. Her behavior is normal.  Slow responses.  Nursing note and vitals reviewed. ASSESSMENT/PLAN:  1. Diabetes mellitus without complication (HCC)  - Lipid panel - VITAMIN D 25 Hydroxy (Vit-D Deficiency, Fractures) - Comprehensive metabolic panel - Urinalysis, Routine w reflex microscopic - Ambulatory referral to diabetic education  2. New onset type 2 diabetes mellitus ([place])  3. GAD (generalized anxiety disorder) Sertraline 25 mg a day. Will increase to 50 mg a day upon return.  4. History of learning disability   5. Obesity (BMI 30-39.9) Discussed diet and exercise with a weight loss of 1-2 pounds a week  6. BV (bacterial vaginosis) *Persistent symptoms after oral metronidazole. We'll give metronidazole cream and refer if needed  Patient Instructions   Take the metformin twice a day with food See the diabetes educator  Walk  every day, try for 30 min a day  Take the sertraline once a day for anxiety  Use the vaginal cream as directed  See me in one month for physical  Need old records     Diabetes Mellitus and Food It is important for you to manage your blood sugar (glucose) level. Your blood glucose level can be greatly affected by what you eat. Eating healthier foods in the appropriate amounts throughout the day at about the same time each day will help you control your blood glucose level. It can also help slow or prevent worsening of your diabetes mellitus. Healthy eating may even help you improve the level of your blood pressure and reach or maintain a healthy weight. General recommendations for healthful eating and cooking habits include:  Eating meals and snacks regularly. Avoid going long periods of time without eating to lose weight.  Eating a diet that consists mainly of plant-based foods, such as fruits, vegetables, nuts, legumes, and whole grains.  Using low-heat cooking methods, such as baking,  instead of high-heat cooking methods, such as deep frying. Work with your dietitian to make sure you understand how to use the Nutrition Facts information on food labels. How can food affect me? Carbohydrates  Carbohydrates affect your blood glucose level more than any other type of food. Your dietitian will help you determine how many carbohydrates to eat at each meal and teach you how to count carbohydrates. Counting carbohydrates is important to keep your blood glucose at a healthy level, especially if you are using insulin or taking certain medicines for diabetes mellitus.   What foods are not recommended? As you make food choices, it is important to remember that all foods are not the same. Some foods have fewer nutrients per serving than other foods, even though they might have the same number of calories or carbohydrates. It is difficult to get your body what it needs when you eat foods with fewer nutrients. Examples of foods that you should avoid that are high in calories and carbohydrates but low in nutrients include:  Trans fats (most processed foods list trans fats on the Nutrition Facts label).  Regular soda. kool aid, sweet tea, any drink with sugar  Juice.  Candy.  Sweets, such as cake, pie, doughnuts, and cookies.  Fried foods. What foods can I eat?   You should eat a variety of foods, including:  Protein.  Lean cuts of meat.  Proteins low in saturated fats, such as fish, egg whites, and beans. Avoid processed meats.  Fruits and vegetables.  Fruits and vegetables that may help control blood glucose levels, such as apples, mangoes, and yams.  Dairy products.  Choose fat-free or low-fat dairy products, such as milk, yogurt, and cheese.  Grains, bread, pasta, and rice.  Choose whole grain products, such as multigrain bread, whole oats, and brown rice. These foods may help control blood pressure.  Fats.  Foods containing healthful fats, such as nuts, avocado,  olive oil, canola oil, and fish. Does everyone with diabetes mellitus have the same meal plan? Because every person with diabetes mellitus is different, there is not one meal plan that works for everyone. It is very important that you meet with a dietitian who will help you create a meal plan that is just right for you.    Raylene Everts, MD

## 2017-03-05 NOTE — Patient Instructions (Addendum)
  Take the metformin twice a day with food See the diabetes educator  Walk every day, try for 30 min a day  Take the sertraline once a day for anxiety  Use the vaginal cream as directed  See me in one month for physical  Need old records     Diabetes Mellitus and Food It is important for you to manage your blood sugar (glucose) level. Your blood glucose level can be greatly affected by what you eat. Eating healthier foods in the appropriate amounts throughout the day at about the same time each day will help you control your blood glucose level. It can also help slow or prevent worsening of your diabetes mellitus. Healthy eating may even help you improve the level of your blood pressure and reach or maintain a healthy weight. General recommendations for healthful eating and cooking habits include:  Eating meals and snacks regularly. Avoid going long periods of time without eating to lose weight.  Eating a diet that consists mainly of plant-based foods, such as fruits, vegetables, nuts, legumes, and whole grains.  Using low-heat cooking methods, such as baking, instead of high-heat cooking methods, such as deep frying. Work with your dietitian to make sure you understand how to use the Nutrition Facts information on food labels. How can food affect me? Carbohydrates  Carbohydrates affect your blood glucose level more than any other type of food. Your dietitian will help you determine how many carbohydrates to eat at each meal and teach you how to count carbohydrates. Counting carbohydrates is important to keep your blood glucose at a healthy level, especially if you are using insulin or taking certain medicines for diabetes mellitus.   What foods are not recommended? As you make food choices, it is important to remember that all foods are not the same. Some foods have fewer nutrients per serving than other foods, even though they might have the same number of calories or carbohydrates.  It is difficult to get your body what it needs when you eat foods with fewer nutrients. Examples of foods that you should avoid that are high in calories and carbohydrates but low in nutrients include:  Trans fats (most processed foods list trans fats on the Nutrition Facts label).  Regular soda. kool aid, sweet tea, any drink with sugar  Juice.  Candy.  Sweets, such as cake, pie, doughnuts, and cookies.  Fried foods. What foods can I eat?   You should eat a variety of foods, including:  Protein.  Lean cuts of meat.  Proteins low in saturated fats, such as fish, egg whites, and beans. Avoid processed meats.  Fruits and vegetables.  Fruits and vegetables that may help control blood glucose levels, such as apples, mangoes, and yams.  Dairy products.  Choose fat-free or low-fat dairy products, such as milk, yogurt, and cheese.  Grains, bread, pasta, and rice.  Choose whole grain products, such as multigrain bread, whole oats, and brown rice. These foods may help control blood pressure.  Fats.  Foods containing healthful fats, such as nuts, avocado, olive oil, canola oil, and fish. Does everyone with diabetes mellitus have the same meal plan? Because every person with diabetes mellitus is different, there is not one meal plan that works for everyone. It is very important that you meet with a dietitian who will help you create a meal plan that is just right for you.

## 2017-03-15 ENCOUNTER — Telehealth: Payer: Self-pay | Admitting: Family Medicine

## 2017-03-15 MED ORDER — BLOOD GLUCOSE TEST VI STRP: 1.0000 | ORAL_STRIP | Freq: Every day | 3 refills | Status: DC

## 2017-03-15 MED ORDER — ACCU-CHEK SOFTCLIX LANCET DEV MISC: 1.0000 | Freq: Every day | 3 refills | Status: DC

## 2017-03-15 NOTE — Telephone Encounter (Signed)
Requesting refill for glucose strips as well as needles  Pharmacy : [facility] in [place] cb#: [phone]

## 2017-03-16 ENCOUNTER — Other Ambulatory Visit: Payer: Self-pay

## 2017-03-16 MED ORDER — BLOOD GLUCOSE MONITOR KIT: PACK | 0 refills | Status: DC

## 2017-03-25 ENCOUNTER — Encounter: Payer: Medicaid Other | Attending: Family Medicine | Admitting: Nutrition

## 2017-03-25 VITALS — Ht 59.0 in | Wt 149.0 lb

## 2017-03-25 DIAGNOSIS — E118 Type 2 diabetes mellitus with unspecified complications: Secondary | ICD-10-CM

## 2017-03-25 DIAGNOSIS — E1165 Type 2 diabetes mellitus with hyperglycemia: Secondary | ICD-10-CM

## 2017-03-25 DIAGNOSIS — Z713 Dietary counseling and surveillance: Secondary | ICD-10-CM | POA: Diagnosis present

## 2017-03-25 DIAGNOSIS — E669 Obesity, unspecified: Secondary | ICD-10-CM

## 2017-03-25 DIAGNOSIS — E119 Type 2 diabetes mellitus without complications: Secondary | ICD-10-CM | POA: Insufficient documentation

## 2017-03-25 DIAGNOSIS — IMO0002 Reserved for concepts with insufficient information to code with codable children: Secondary | ICD-10-CM

## 2017-03-25 NOTE — Patient Instructions (Signed)
Goals 1. Follow My Plate 2. Exercise 30 minutes a day . 3. Eat three meals per day 4. Cut out snacks Check blood sugars twice a day Lose 1 lb per week Get BS less than 150 mg/dl.

## 2017-03-25 NOTE — Progress Notes (Signed)
Diabetes Self-Management Education  Visit Type: First/Initial  Appt. Start Time: 1100  Appt. End Time:1200  03/25/2017  [name], identified by name and date of birth, is a 36 y.o. female with a diagnosis of Diabetes: Type 2. She just found out she had DM recently after losing some weight and getting sick and being really thirsty and tired. Hasn't been followed by a PCP on a regular basis.  Currently on Metformin 500 mg BID. She is testing blood sugars 1-2 times per day. FBS are coming down from the 250's down to lower 200's.     Eats 2 meals per day, often skipping breakfast. Sleeps in late and usually eats late breakfst/lunch and then dinner later.   Motivated to make changes with diet and physical activity to improve weight and her diabetes. Diet is inconsistent to meet her needs and high in fat, sodium and low in fresh fruits and vegetables.  ASSESSMENT  Height 4\' 11"  (1.499 m), weight 149 lb (67.6 kg). Body mass index is 30.09 kg/m.      Diabetes Self-Management Education - 03/25/17 1054      Visit Information   Visit Type First/Initial     Initial Visit   Diabetes Type Type 2   Are you currently following a meal plan? No   Are you taking your medications as prescribed? Yes   Date Diagnosed April 2018     Health Coping   How would you rate your overall health? Good     Psychosocial Assessment   Patient Belief/Attitude about Diabetes Afraid   Self-care barriers None   Self-management support Doctor's office;Family   Other persons present Patient   Patient Concerns Nutrition/Meal planning;Medication;Monitoring;Healthy Lifestyle;Problem Solving;Weight Control   Special Needs None   Preferred Learning Style No preference indicated   Learning Readiness Change in progress   How often do you need to have someone help you when you read instructions, pamphlets, or other written materials from your doctor or pharmacy? 1 - Never   What is the last grade level you  completed in school? 12     Pre-Education Assessment   Patient understands the diabetes disease and treatment process. Needs Instruction   Patient understands incorporating nutritional management into lifestyle. Needs Instruction   Patient undertands incorporating physical activity into lifestyle. Needs Instruction   Patient understands using medications safely. Needs Instruction   Patient understands monitoring blood glucose, interpreting and using results Needs Instruction   Patient understands prevention, detection, and treatment of acute complications. Needs Instruction   Patient understands prevention, detection, and treatment of chronic complications. Needs Instruction   Patient understands how to develop strategies to address psychosocial issues. Needs Instruction   Patient understands how to develop strategies to promote health/change behavior. Needs Instruction     Complications   Last HgB A1C per patient/outside source 10.4 %   How often do you check your blood sugar? 1-2 times/day   Fasting Blood glucose range (mg/dL) 180-200   Postprandial Blood glucose range (mg/dL) 180-200   Number of hypoglycemic episodes per month 0   Number of hyperglycemic episodes per week 10   Can you tell when your blood sugar is high? Yes   What do you do if your blood sugar is high? water   Have you had a dilated eye exam in the past 12 months? No   Have you had a dental exam in the past 12 months? No   Are you checking your feet? Yes   How many  days per week are you checking your feet? 7     Dietary Intake   Breakfast oatmeal or cereal   Lunch sandwich, chips or skips sometimes, lemonade   Dinner wings, Lemonade   Snack (evening) misc   Beverage(s) lemonade, SF koolaid, flavaored water     Exercise   Exercise Type ADL's     Patient Education   Previous Diabetes Education No   Disease state  Definition of diabetes, type 1 and 2, and the diagnosis of diabetes;Factors that contribute to the  development of diabetes;Explored patient's options for treatment of their diabetes   Nutrition management  Role of diet in the treatment of diabetes and the relationship between the three main macronutrients and blood glucose level;Carbohydrate counting;Meal timing in regards to the patients' current diabetes medication.;Reviewed blood glucose goals for pre and post meals and how to evaluate the patients' food intake on their blood glucose level.;Meal options for control of blood glucose level and chronic complications.   Physical activity and exercise  Role of exercise on diabetes management, blood pressure control and cardiac health.;Identified with patient nutritional and/or medication changes necessary with exercise.;Helped patient identify appropriate exercises in relation to his/her diabetes, diabetes complications and other health issue.   Monitoring Taught/evaluated SMBG meter.;Purpose and frequency of SMBG.;Taught/discussed recording of test results and interpretation of SMBG.;Interpreting lab values - A1C, lipid, urine microalbumina.   Acute complications Taught treatment of hypoglycemia - the 15 rule.;Discussed and identified patients' treatment of hyperglycemia.   Chronic complications Relationship between chronic complications and blood glucose control;Assessed and discussed foot care and prevention of foot problems;Reviewed with patient heart disease, higher risk of, and prevention;Identified and discussed with patient  current chronic complications   Psychosocial adjustment Worked with patient to identify barriers to care and solutions;Helped patient identify a support system for diabetes management;Identified and addressed patients feelings and concerns about diabetes   Personal strategies to promote health Lifestyle issues that need to be addressed for better diabetes care;Helped patient develop diabetes management plan for (enter comment)     Individualized Goals (developed by patient)    Nutrition Follow meal plan discussed;General guidelines for healthy choices and portions discussed;Adjust meds/carbs with exercise as discussed   Physical Activity Exercise 5-7 days per week;30 minutes per day   Medications take my medication as prescribed   Monitoring  test my blood glucose as discussed   Reducing Risk examine blood glucose patterns;do foot checks daily     Post-Education Assessment   Patient understands the diabetes disease and treatment process. Needs Review   Patient understands incorporating nutritional management into lifestyle. Needs Review   Patient undertands incorporating physical activity into lifestyle. Needs Review   Patient understands using medications safely. Needs Review   Patient understands monitoring blood glucose, interpreting and using results Needs Review   Patient understands prevention, detection, and treatment of acute complications. Needs Review   Patient understands prevention, detection, and treatment of chronic complications. Needs Review   Patient understands how to develop strategies to address psychosocial issues. Needs Review   Patient understands how to develop strategies to promote health/change behavior. Needs Review     Outcomes   Expected Outcomes Demonstrated interest in learning. Expect positive outcomes   Future DMSE 4-6 wks   Program Status Completed      Individualized Plan for Diabetes Self-Management Training:   Learning Objective:  Patient will have a greater understanding of diabetes self-management. Patient education plan is to attend individual and/or group sessions per assessed needs and  concerns.   Plan:   Patient Instructions  Goals 1. Follow My Plate 2. Exercise 30 minutes a day . 3. Eat three meals per day 4. Cut out snacks Check blood sugars twice a day Lose 1 lb per week Get BS less than 150 mg/dl.   Expected Outcomes:  Demonstrated interest in learning. Expect positive outcomes  Education material  provided: Living Well with Diabetes, Food label handouts, A1C conversion sheet, Meal plan card, My Plate and Carbohydrate counting sheet  If problems or questions, patient to contact team via:  Phone and Email  Future DSME appointment: 4-6 wks

## 2017-04-06 ENCOUNTER — Ambulatory Visit (INDEPENDENT_AMBULATORY_CARE_PROVIDER_SITE_OTHER): Payer: Medicaid Other | Admitting: Family Medicine

## 2017-04-06 ENCOUNTER — Encounter: Payer: Self-pay | Admitting: Family Medicine

## 2017-04-06 VITALS — BP 110/72 | HR 76 | Temp 97.9°F | Resp 16 | Ht 59.0 in | Wt 148.1 lb

## 2017-04-06 DIAGNOSIS — E119 Type 2 diabetes mellitus without complications: Secondary | ICD-10-CM

## 2017-04-06 DIAGNOSIS — F411 Generalized anxiety disorder: Secondary | ICD-10-CM | POA: Diagnosis not present

## 2017-04-06 DIAGNOSIS — Z0000 Encounter for general adult medical examination without abnormal findings: Secondary | ICD-10-CM | POA: Diagnosis not present

## 2017-04-06 MED ORDER — SERTRALINE HCL 50 MG PO TABS: 50.0000 mg | ORAL_TABLET | Freq: Every day | ORAL | 1 refills | Status: DC

## 2017-04-06 NOTE — Patient Instructions (Signed)
You are doing well Your diabetes is improving Walk every day that you are able Take the sertraline 50 mg a day I sent a prescription to your pharmacy I placed a referral for a diabetic eye exam  See me in July Get labs before your visit Call sooner for problems

## 2017-04-06 NOTE — Progress Notes (Signed)
Chief Complaint  Patient presents with  . Annual Exam   Newly diagnosed diabetic.  Brings in her sugar log and she is consistently in the 150 range.  Went to diabetic educator and is trying to adhere to diet.  Exercise is not regular, but she knows to try daily. She needs an eye exam Feet are not affected.  Exam today Need lipid screening.  Will do with next labs BP is good Was started on zoloft for anxiety and she thinks it helps.  Will increase to get a more therapeutic dose/response.  Patient Active Problem List   Diagnosis Date Noted  . Diabetes mellitus without complication ([place]) 85/92/9244  . GAD (generalized anxiety disorder) 03/05/2017  . History of learning disability 03/05/2017  . Obesity (BMI 30-39.9) 03/05/2017    Outpatient Encounter Prescriptions as of 04/06/2017  Medication Sig  . acetaminophen (TYLENOL) 500 MG tablet Take 1,000 mg by mouth every 6 (six) hours as needed.  . blood glucose meter kit and supplies KIT Dispense based on patient and insurance preference. Use up to four times daily as directed. (FOR ICD-9 250.00, 250.01).  . Glucose Blood (BLOOD GLUCOSE TEST STRIPS) STRP 1 each by Other route daily.  El[employer] Devices (ACCU-CHEK SOFTCLIX) lancets 1 each by Other route daily. Use as instructed  . metFORMIN (GLUCOPHAGE) 500 MG tablet Take 1 tablet (500 mg total) by mouth 2 (two) times daily with a meal.  . [DISCONTINUED] metroNIDAZOLE (METROGEL VAGINAL) 0.75 % vaginal gel Place 1 Applicatorful vaginally 2 (two) times daily.  . [DISCONTINUED] sertraline (ZOLOFT) 25 MG tablet Take 1 tablet (25 mg total) by mouth daily.  . sertraline (ZOLOFT) 50 MG tablet Take 1 tablet (50 mg total) by mouth daily.   No facility-administered encounter medications on file as of 04/06/2017.     No Known Allergies  Review of Systems  Constitutional: Negative for activity change, appetite change and unexpected weight change.  HENT: Negative for congestion, dental problem,  postnasal drip and rhinorrhea.   Eyes: Negative for redness and visual disturbance.  Respiratory: Negative for cough and shortness of breath.   Cardiovascular: Negative for chest pain, palpitations and leg swelling.  Gastrointestinal: Negative for abdominal pain, constipation and diarrhea.  Genitourinary: Negative for difficulty urinating, frequency and menstrual problem.  Musculoskeletal: Negative for arthralgias and back pain.  Neurological: Negative for dizziness and headaches.  Psychiatric/Behavioral: Negative for dysphoric mood and sleep disturbance. The patient is not nervous/anxious.     BP 110/72 (BP Location: Right Arm, Patient Position: Sitting, Cuff Size: Normal)   Pulse 76   Temp 97.9 F (36.6 C) (Temporal)   Resp 16   Ht 4' 11"  (1.499 m)   Wt 148 lb 1.3 oz (67.2 kg)   LMP 04/01/2017 (Exact Date)   SpO2 98%   BMI 29.91 kg/m   Physical Exam BP 110/72 (BP Location: Right Arm, Patient Position: Sitting, Cuff Size: Normal)   Pulse 76   Temp 97.9 F (36.6 C) (Temporal)   Resp 16   Ht 4' 11"  (1.499 m)   Wt 148 lb 1.3 oz (67.2 kg)   LMP 04/01/2017 (Exact Date)   SpO2 98%   BMI 29.91 kg/m   General Appearance:    Alert, cooperative, no distress, appears stated age  Head:    Normocephalic, without obvious abnormality, atraumatic  Eyes:    PERRL, conjunctiva/corneas clear, EOM's intact, both eyes  Ears:    Normal TM's and external ear canals, both ears  Nose:  Nares normal, septum midline, mucosa normal, no drainage    or sinus tenderness  Throat:   Lips, mucosa, and tongue normal; teeth and gums normal  Neck:   Supple, symmetrical, trachea midline, no adenopathy;    thyroid:  no enlargement/tenderness/nodules; no carotid   bruit   Back:     Symmetric, no curvature, ROM normal, no CVA tenderness  Lungs:     Clear to auscultation bilaterally, respirations unlabored  Chest Wall:    No tenderness or deformity   Heart:    Regular rate and rhythm, S1 and S2 normal, no  murmur, rub   or gallop  Breast Exam:    No tenderness, masses, or nipple abnormality  Abdomen:     Soft, non-tender, bowel sounds active all four quadrants,    no masses, no organomegaly  Extremities:   Extremities normal, atraumatic, no cyanosis or edema  Pulses:   2+ and symmetric all extremities  Skin:   Skin color, texture, turgor normal, no rashes or lesions  Lymph nodes:   Cervical, supraclavicular, and axillary nodes normal  Neurologic:   Normal strength, sensation and reflexes    throughout   ASSESSMENT/PLAN:  1. Diabetes mellitus without complication ([place])  - Ambulatory referral to Ophthalmology - BASIC METABOLIC PANEL WITH GFR - Hemoglobin A1c - Lipid panel  2. Physical exam, routine 3. GAD - improved   Patient Instructions  You are doing well Your diabetes is improving Walk every day that you are able Take the sertraline 50 mg a day I sent a prescription to your pharmacy I placed a referral for a diabetic eye exam  See me in July Get labs before your visit Call sooner for problems   Raylene Everts, MD

## 2017-04-28 ENCOUNTER — Telehealth: Payer: Self-pay | Admitting: Family Medicine

## 2017-04-28 ENCOUNTER — Other Ambulatory Visit: Payer: Self-pay

## 2017-04-28 MED ORDER — METFORMIN HCL 500 MG PO TABS: 500.0000 mg | ORAL_TABLET | Freq: Two times a day (BID) | ORAL | 1 refills | Status: DC

## 2017-04-28 NOTE — Telephone Encounter (Signed)
Requesting a refill for metformin 500mg   [facility]  Patient aware of [facility] policy.

## 2017-04-28 NOTE — Telephone Encounter (Signed)
Done

## 2017-05-05 ENCOUNTER — Encounter: Payer: Medicaid Other | Attending: Family Medicine | Admitting: Nutrition

## 2017-05-05 VITALS — Ht 59.0 in | Wt 143.0 lb

## 2017-05-05 DIAGNOSIS — E119 Type 2 diabetes mellitus without complications: Secondary | ICD-10-CM | POA: Insufficient documentation

## 2017-05-05 DIAGNOSIS — Z713 Dietary counseling and surveillance: Secondary | ICD-10-CM | POA: Insufficient documentation

## 2017-05-05 DIAGNOSIS — E118 Type 2 diabetes mellitus with unspecified complications: Secondary | ICD-10-CM

## 2017-05-05 DIAGNOSIS — Z6828 Body mass index (BMI) 28.0-28.9, adult: Secondary | ICD-10-CM | POA: Insufficient documentation

## 2017-05-05 NOTE — Patient Instructions (Addendum)
Goals Keep up the great job!!  1. Continue to exercise 30 minutes 5 time per week 2. Eat three meals a day and don't skip meals 3. Cut out diet soda and lemonade and drink more water Lose 2-3 per month Increase fresh fruits and vegetables. Cut down on fried foods

## 2017-05-05 NOTE — Progress Notes (Signed)
Diabetes Self-Management Education  Visit Type:    Appt. Start Time: 1100  Appt. End Time:1200  05/05/2017  Ms. Nolon Bussing, identified by name and date of birth, is a 36 y.o. female with a diagnosis of Diabetes:Type 2  Lost 6-7 lb Metformin 500 mg BID.  Diabetes Self-Management Education  Visit Type:  Follow-up  Appt. Start Time: 930 Appt. End Time: 1000  05/05/2017  Ms. Nolon Bussing, identified by name and date of birth, is a 36 y.o. female with a diagnosis of Diabetes Type 2.   She has lost 6 lbs. She feels much better. Her blood sugars are doing very well. FBS 101-122 this past week. BS before dinner 109-130 mg/dl. Metformin 500 mg BID. Exercising most days of week.   Has cut out soda, junk food, sweets.  Hasn't been able to get eye exam yet due to no vision insurance and cost.   Diet needs more fresh fruits, vegetables and whole grains. Needs to be sure not to skip meals.  ASSESSMENT  Weight 143 lb (64.9 kg). Body mass index is 28.88 kg/m.       Diabetes Self-Management Education - 05/05/17 0926      Health Coping   How would you rate your overall health? Good     Complications   How often do you check your blood sugar? 1-2 times/day   Fasting Blood glucose range (mg/dL) 70-129   Postprandial Blood glucose range (mg/dL) 130-179   Number of hypoglycemic episodes per month 0   Number of hyperglycemic episodes per week 0   Can you tell when your blood sugar is high? Yes     Dietary Intake   Breakfast Eggs and bacon, fruit and Cherrios,   Lunch skipped   Dinner 2 chicken legs,  dirty rice, biscuit, Water and Lemonade     Exercise   Exercise Type ADL's   How many days per week to you exercise? 4   How many minutes per day do you exercise? 30   Total minutes per week of exercise 120     Patient Education   Nutrition management  Food label reading, portion sizes and measuring food.;Carbohydrate counting   Physical activity and exercise  Role of exercise on  diabetes management, blood pressure control and cardiac health.;Identified with patient nutritional and/or medication changes necessary with exercise.     Individualized Goals (developed by patient)   Physical Activity Exercise 5-7 days per week;30 minutes per day   Medications take my medication as prescribed   Monitoring  test my blood glucose as discussed   Reducing Risk examine blood glucose patterns     Patient Self-Evaluation of Goals - Patient rates self as meeting previously set goals (% of time)   Nutrition >75%   Physical Activity >75%   Medications >75%   Monitoring >75%   Problem Solving >75%   Reducing Risk >75%   Health Coping >75%     Post-Education Assessment   Patient understands the diabetes disease and treatment process. Demonstrates understanding / competency   Patient understands incorporating nutritional management into lifestyle. Demonstrates understanding / competency   Patient undertands incorporating physical activity into lifestyle. Demonstrates understanding / competency   Patient understands using medications safely. Demonstrates understanding / competency   Patient understands monitoring blood glucose, interpreting and using results Demonstrates understanding / competency   Patient understands prevention, detection, and treatment of acute complications. Demonstrates understanding / competency   Patient understands prevention, detection, and treatment of chronic complications. Demonstrates understanding /  competency   Patient understands how to develop strategies to address psychosocial issues. Demonstrates understanding / competency   Patient understands how to develop strategies to promote health/change behavior. Demonstrates understanding / competency     Outcomes   Program Status Completed      Learning Objective:  Patient will have a greater understanding of diabetes self-management. Patient education plan is to attend individual and/or group  sessions per assessed needs and concerns.   Plan:   Patient Instructions  Goals Keep up the great job!!  1. Continue to exercise 30 minutes 5 time per week 2. Eat three meals a day and don't skip meals 3. Cut out diet soda and lemonade and drink more water Lose 2-3 per month Increase fresh fruits and vegetables. Cut down on fried foods    Expected Outcomes:  Demonstrated limited interest in learning.  Expect minimal changes, Demonstrated interest in learning. Expect positive outcomes  Education material provided: Food label handouts  If problems or questions, patient to contact team via:  Phone and Email  Future DSME appointment: - 2 months       .

## 2017-06-04 ENCOUNTER — Other Ambulatory Visit: Payer: Self-pay | Admitting: Family Medicine

## 2017-06-08 ENCOUNTER — Encounter: Payer: Self-pay | Admitting: Family Medicine

## 2017-06-08 ENCOUNTER — Ambulatory Visit (INDEPENDENT_AMBULATORY_CARE_PROVIDER_SITE_OTHER): Payer: Medicaid Other | Admitting: Family Medicine

## 2017-06-08 VITALS — BP 116/84 | HR 84 | Temp 98.7°F | Resp 18 | Ht 59.0 in | Wt 148.0 lb

## 2017-06-08 DIAGNOSIS — E119 Type 2 diabetes mellitus without complications: Secondary | ICD-10-CM | POA: Diagnosis not present

## 2017-06-08 MED ORDER — ACCU-CHEK SOFTCLIX LANCET DEV MISC: 1.0000 | Freq: Two times a day (BID) | 7 refills | Status: DC

## 2017-06-08 NOTE — Patient Instructions (Signed)
Blood tests today I will send you a letter with your test results.  If there is anything of concern, we will call right away. Need to exercise every day Follow the diabetic diet NO SODA or sweet drinks  See me in 3 months

## 2017-06-08 NOTE — Progress Notes (Signed)
Chief Complaint  Patient presents with  . Follow-up  routine follow up Noncompliant drinking soda Not exercising Sugars going up Log reviewed At first all were under 150, now with regular values in the 200's BP good Did NOT get the labs ordered before today's visit No complaint Has gained 5 lbs int he last month   Patient Active Problem List   Diagnosis Date Noted  . Diabetes mellitus without complication ([place]) 28/20/6015  . GAD (generalized anxiety disorder) 03/05/2017  . History of learning disability 03/05/2017  . Obesity (BMI 30-39.9) 03/05/2017    Outpatient Encounter Prescriptions as of 06/08/2017  Medication Sig  . ACCU-CHEK AVIVA PLUS test strip USE UP TO FOUR TIMES DAILY AS DIRECTED  . acetaminophen (TYLENOL) 500 MG tablet Take 1,000 mg by mouth every 6 (six) hours as needed.  . blood glucose meter kit and supplies KIT Dispense based on patient and insurance preference. Use up to four times daily as directed. (FOR ICD-9 250.00, 250.01).  [employer] Devices (ACCU-CHEK SOFTCLIX) lancets 1 each by Other route 2 (two) times daily. Use as instructed  . metFORMIN (GLUCOPHAGE) 500 MG tablet Take 1 tablet (500 mg total) by mouth 2 (two) times daily with a meal.  . sertraline (ZOLOFT) 50 MG tablet Take 1 tablet (50 mg total) by mouth daily.  . [DISCONTINUED] Lancet Devices (ACCU-CHEK SOFTCLIX) lancets 1 each by Other route daily. Use as instructed   No facility-administered encounter medications on file as of 06/08/2017.     No Known Allergies  Review of Systems  Constitutional: Positive for unexpected weight change. Negative for activity change and appetite change.  HENT: Negative for congestion, dental problem, postnasal drip and rhinorrhea.   Eyes: Negative for redness and visual disturbance.  Respiratory: Negative for cough and shortness of breath.   Cardiovascular: Negative for chest pain, palpitations and leg swelling.  Gastrointestinal: Negative for abdominal  pain, constipation and diarrhea.  Genitourinary: Positive for frequency. Negative for difficulty urinating and menstrual problem.  Musculoskeletal: Negative for arthralgias and back pain.  Neurological: Negative for dizziness and headaches.  Psychiatric/Behavioral: Negative for dysphoric mood and sleep disturbance. The patient is not nervous/anxious.     BP 116/84 (BP Location: Right Arm, Patient Position: Sitting, Cuff Size: Normal)   Pulse 84   Temp 98.7 F (37.1 C) (Temporal)   Resp 18   Ht 4' 11"  (1.499 m)   Wt 148 lb 0.6 oz (67.2 kg)   SpO2 99%   BMI 29.90 kg/m   Physical Exam  Constitutional: She is oriented to person, place, and time. She appears well-developed and well-nourished.  HENT:  Head: Normocephalic and atraumatic.  Mouth/Throat: Oropharynx is clear and moist.  Eyes: Pupils are equal, round, and reactive to light. Conjunctivae are normal.  Wears glasses.  Neck: Normal range of motion. Neck supple. No thyromegaly present.  Cardiovascular: Normal rate, regular rhythm and normal heart sounds.   Pulmonary/Chest: Effort normal and breath sounds normal. No respiratory distress.  Abdominal: Soft. Bowel sounds are normal.  Musculoskeletal: Normal range of motion. She exhibits no edema.  Lymphadenopathy:    She has no cervical adenopathy.  Neurological: She is alert and oriented to person, place, and time.  Gait normal  Skin: Skin is warm and dry.  Psychiatric: She has a normal mood and affect. Her behavior is normal.  Slow responses.  Nursing note and vitals reviewed.   ASSESSMENT/PLAN:  1. Diabetes mellitus without complication ([facility]) Discussed diet, exercise, WATER - no soda or  juice (lemonaid) Labs today See educator on Aug 1 See me in 3 motnhs   Patient Instructions  Blood tests today I will send you a letter with your test results.  If there is anything of concern, we will call right away. Need to exercise every day Follow the diabetic diet NO SODA or  sweet drinks  See me in 3 months   Raylene Everts, MD

## 2017-06-09 LAB — BASIC METABOLIC PANEL WITH GFR
BUN: 9 mg/dL (ref 7–25)
CALCIUM: 8.9 mg/dL (ref 8.6–10.2)
CHLORIDE: 105 mmol/L (ref 98–110)
CO2: 24 mmol/L (ref 20–31)
CREATININE: 0.66 mg/dL (ref 0.50–1.10)
GFR, Est African American: >89 mL/min (ref >=60)
GFR, Est Non African American: >89 mL/min (ref >=60)
GLUCOSE: 122 mg/dL — AB (ref 65–99)
Potassium: 4.3 mmol/L (ref 3.5–5.3)
Sodium: 137 mmol/L (ref 135–146)

## 2017-06-09 LAB — LIPID PANEL
CHOL/HDL RATIO: 2.4 ratio (ref <5.0)
Cholesterol: 98 mg/dL (ref <200)
HDL: 41 mg/dL — AB (ref >50)
LDL CALC: 45 mg/dL (ref <100)
Triglycerides: 59 mg/dL (ref <150)
VLDL: 12 mg/dL (ref <30)

## 2017-06-10 ENCOUNTER — Encounter: Payer: Self-pay | Admitting: Family Medicine

## 2017-06-10 LAB — HEMOGLOBIN A1C
Hgb A1c MFr Bld: 5.5 % (ref <5.7)
Mean Plasma Glucose: 111 mg/dL

## 2017-06-23 ENCOUNTER — Encounter: Payer: Medicaid Other | Attending: Family Medicine | Admitting: Nutrition

## 2017-06-23 VITALS — Wt 149.0 lb

## 2017-06-23 DIAGNOSIS — Z713 Dietary counseling and surveillance: Secondary | ICD-10-CM | POA: Diagnosis not present

## 2017-06-23 DIAGNOSIS — E119 Type 2 diabetes mellitus without complications: Secondary | ICD-10-CM | POA: Insufficient documentation

## 2017-06-23 DIAGNOSIS — E669 Obesity, unspecified: Secondary | ICD-10-CM

## 2017-06-23 NOTE — Patient Instructions (Addendum)
Goals Congrats on your A!C 1. Exercising 30 minutes 4-5 times per week 2. Lose 1 lb per week 3. Cut fast food, high fat foods 4. Drink only water with lemon  Avoid sugar free liquids

## 2017-07-13 NOTE — Progress Notes (Signed)
Lab Results  Component Value Date   HGBA1C 5.5 06/09/2017   Diabetes Self-Management Education  Visit Type:  Follow-up  Appt. Start Time: 1330  Appt. End Time: 1400  06/23/2017  Debra Tanner, identified by name and date of birth, is a 36 y.o. female with a diagnosis of Diabetes:  .  A1C 5.5%. A1C down from 10.5% to 5.5% in less than 3 months!!! She is doing much better. Cut out sodas, junk food and sweets. Willing to step up her exericse for further weight loss. ASSESSMENT  Weight 149 lb (67.6 kg). Body mass index is 30.09 kg/m.     Learning Objective:  Patient will have a greater understanding of diabetes self-management. Patient education plan is to attend individual and/or group sessions per assessed needs and concerns.   Plan:   Patient Instructions  Goals Congrats on your A!C 1. Exercising 30 minutes 4-5 times per week 2. Lose 1 lb per week 3. Cut fast food, high fat foods 4. Drink only water with lemon  Avoid sugar free liquids    Expected Outcomes:  Demonstrated interest in learning. Expect positive outcomes  Education material provided: Meal plan card and My Plate  If problems or questions, patient to contact team via:  Phone and Email  Future DSME appointment: - 3-4 months

## 2017-09-09 ENCOUNTER — Ambulatory Visit (INDEPENDENT_AMBULATORY_CARE_PROVIDER_SITE_OTHER): Payer: Medicaid Other | Admitting: Family Medicine

## 2017-09-09 ENCOUNTER — Encounter: Payer: Self-pay | Admitting: Family Medicine

## 2017-09-09 VITALS — BP 124/80 | Resp 16 | Ht 59.0 in | Wt 150.0 lb

## 2017-09-09 DIAGNOSIS — F411 Generalized anxiety disorder: Secondary | ICD-10-CM

## 2017-09-09 DIAGNOSIS — E119 Type 2 diabetes mellitus without complications: Secondary | ICD-10-CM

## 2017-09-09 DIAGNOSIS — D649 Anemia, unspecified: Secondary | ICD-10-CM | POA: Diagnosis not present

## 2017-09-09 NOTE — Patient Instructions (Signed)
continue to eat well and exercise You are doing well Need blood work I will send you a letter with your test results.  If there is anything of concern, we will call right away. See me in six months

## 2017-09-09 NOTE — Progress Notes (Signed)
Chief Complaint  Patient presents with  . Follow-up    3 month   Here for Dm follow up Is doing very well Is compliant with BID sugars and brings in 3 months for me to check.  It appears that she is under 150 =about 80 % of the time and under 200 =100% She is due for repeat blood work today She complains of wanting to chew on ice all the time.  I worry about PICA and have ordered CBC and iron studies She is doing a little exercise at home according to a program on her phone. Weight is stable She stopped her own sertraline because it did no t feel like it was helping her.  Does not feel increased anxiety or depression.   Patient Active Problem List   Diagnosis Date Noted  . Diabetes mellitus without complication ([place]) 74/25/9563  . GAD (generalized anxiety disorder) 03/05/2017  . History of learning disability 03/05/2017  . Obesity (BMI 30-39.9) 03/05/2017    Outpatient Encounter Prescriptions as of 09/09/2017  Medication Sig  . ACCU-CHEK AVIVA PLUS test strip USE UP TO FOUR TIMES DAILY AS DIRECTED  . acetaminophen (TYLENOL) 500 MG tablet Take 1,000 mg by mouth every 6 (six) hours as needed.  . blood glucose meter kit and supplies KIT Dispense based on patient and insurance preference. Use up to four times daily as directed. (FOR ICD-9 250.00, 250.01).  El[employer] Devices (ACCU-CHEK SOFTCLIX) lancets 1 each by Other route 2 (two) times daily. Use as instructed  . metFORMIN (GLUCOPHAGE) 500 MG tablet Take 1 tablet (500 mg total) by mouth 2 (two) times daily with a meal.   No facility-administered encounter medications on file as of 09/09/2017.    No Known Allergies  Review of Systems  Constitutional: Negative for activity change, appetite change and unexpected weight change.  HENT: Negative for congestion, dental problem, postnasal drip and rhinorrhea.   Eyes: Negative for redness and visual disturbance.  Respiratory: Negative for cough and shortness of breath.     Cardiovascular: Negative for chest pain, palpitations and leg swelling.  Gastrointestinal: Negative for abdominal pain, constipation and diarrhea.  Genitourinary: Negative for difficulty urinating, frequency and menstrual problem.  Musculoskeletal: Negative for arthralgias and back pain.  Neurological: Negative for dizziness and headaches.  Psychiatric/Behavioral: Negative for dysphoric mood and sleep disturbance. The patient is not nervous/anxious.     BP 124/80 (BP Location: Right Arm, Patient Position: Sitting, Cuff Size: Normal)   Resp 16   Ht _0  (1.499 m)   Wt 150 lb 0.6 oz (68.1 kg)   SpO2 98%   BMI 30.30 kg/m   Physical Exam  Constitutional: She is oriented to person, place, and time. She appears well-developed and well-nourished.  HENT:  Head: Normocephalic and atraumatic.  Mouth/Throat: Oropharynx is clear and moist.  Eyes: Pupils are equal, round, and reactive to light. Conjunctivae are normal.  Wears glasses.  Neck: Normal range of motion. Neck supple. No thyromegaly present.  Cardiovascular: Normal rate, regular rhythm and normal heart sounds.   Pulmonary/Chest: Effort normal and breath sounds normal. No respiratory distress.  Abdominal: Soft. Bowel sounds are normal.  Musculoskeletal: Normal range of motion. She exhibits no edema.  Lymphadenopathy:    She has no cervical adenopathy.  Neurological: She is alert and oriented to person, place, and time.  Gait normal  Skin: Skin is warm and dry.  Psychiatric: She has a normal mood and affect. Her behavior is normal.  Slow responses.  Eager to [employer]  Nursing note and vitals reviewed.   ASSESSMENT/PLAN:  1. Diabetes mellitus without complication ([facility]) - COMPLETE METABOLIC PANEL WITH GFR - Hemoglobin A1c  2. GAD (generalized anxiety disorder) Doing well off of the sertraline  3. Anemia, unspecified type Old CBC with mild anemia.  Will eval the pica complaint - Fe+TIBC+Fer - CBC with  Differential/Platelet   Patient Instructions  continue to eat well and exercise You are doing well Need blood work I will send you a letter with your test results.  If there is anything of concern, we will call right away. See me in six months    Raylene Everts, MD

## 2017-09-10 LAB — IRON,TIBC AND FERRITIN PANEL
%SAT: 6 % (calc) — ABNORMAL LOW (ref 11–50)
FERRITIN: 8 ng/mL — AB (ref 10–154)
IRON: 28 ug/dL — AB (ref 40–190)
TIBC: 439 ug/dL (ref 250–450)

## 2017-09-10 LAB — COMPLETE METABOLIC PANEL WITH GFR
AG RATIO: 1.2 (calc) (ref 1.0–2.5)
ALBUMIN MSPROF: 3.9 g/dL (ref 3.6–5.1)
ALT: 23 U/L (ref 6–29)
AST: 18 U/L (ref 10–30)
Alkaline phosphatase (APISO): 67 U/L (ref 33–115)
BUN: 8 mg/dL (ref 7–25)
CALCIUM: 9.3 mg/dL (ref 8.6–10.2)
CHLORIDE: 107 mmol/L (ref 98–110)
CO2: 26 mmol/L (ref 20–32)
Creat: 0.78 mg/dL (ref 0.50–1.10)
GFR, Est African American: 114 mL/min/{1.73_m2} (ref >=60)
GFR, Est Non African American: 98 mL/min/{1.73_m2} (ref >=60)
Globulin: 3.2 g/dL (calc) (ref 1.9–3.7)
Glucose, Bld: 97 mg/dL (ref 65–139)
POTASSIUM: 4 mmol/L (ref 3.5–5.3)
SODIUM: 140 mmol/L (ref 135–146)
TOTAL PROTEIN: 7.1 g/dL (ref 6.1–8.1)
Total Bilirubin: 0.4 mg/dL (ref 0.2–1.2)

## 2017-09-10 LAB — CBC WITH DIFFERENTIAL/PLATELET
BASOS ABS: 71 {cells}/uL (ref 0–200)
Basophils Relative: 0.7 %
EOS ABS: 122 {cells}/uL (ref 15–500)
Eosinophils Relative: 1.2 %
HCT: 31.2 % — ABNORMAL LOW (ref 35.0–45.0)
HEMOGLOBIN: 9.5 g/dL — AB (ref 11.7–15.5)
Lymphs Abs: 3366 cells/uL (ref 850–3900)
MCH: 25.1 pg — AB (ref 27.0–33.0)
MCHC: 30.4 g/dL — AB (ref 32.0–36.0)
MCV: 82.3 fL (ref 80.0–100.0)
MPV: 11.2 fL (ref 7.5–12.5)
Monocytes Relative: 8 %
NEUTROS ABS: 5824 {cells}/uL (ref 1500–7800)
Neutrophils Relative %: 57.1 %
Platelets: 425 10*3/uL — ABNORMAL HIGH (ref 140–400)
RBC: 3.79 10*6/uL — ABNORMAL LOW (ref 3.80–5.10)
RDW: 14 % (ref 11.0–15.0)
TOTAL LYMPHOCYTE: 33 %
WBC mixed population: 816 cells/uL (ref 200–950)
WBC: 10.2 10*3/uL (ref 3.8–10.8)

## 2017-09-10 LAB — HEMOGLOBIN A1C
EAG (MMOL/L): 5.7 (calc)
Hgb A1c MFr Bld: 5.2 % of total Hgb (ref <5.7)
MEAN PLASMA GLUCOSE: 103 (calc)

## 2017-09-13 ENCOUNTER — Encounter: Payer: Self-pay | Admitting: Family Medicine

## 2017-09-21 ENCOUNTER — Ambulatory Visit: Payer: Medicaid Other | Admitting: Nutrition

## 2017-10-18 ENCOUNTER — Telehealth: Payer: Self-pay | Admitting: *Deleted

## 2017-10-18 MED ORDER — METFORMIN HCL 500 MG PO TABS: 500.0000 mg | ORAL_TABLET | Freq: Two times a day (BID) | ORAL | 3 refills | Status: DC

## 2017-10-18 NOTE — Telephone Encounter (Signed)
Done

## 2017-10-18 NOTE — Telephone Encounter (Signed)
Seen 10 18 18 

## 2017-10-18 NOTE — Telephone Encounter (Signed)
Patient called requesting metformin to be called into [place]. Please advise

## 2018-01-31 ENCOUNTER — Encounter: Payer: Self-pay | Admitting: Family Medicine

## 2018-03-10 ENCOUNTER — Telehealth: Payer: Self-pay | Admitting: Family Medicine

## 2018-03-10 ENCOUNTER — Encounter: Payer: Self-pay | Admitting: Family Medicine

## 2018-03-10 ENCOUNTER — Ambulatory Visit (INDEPENDENT_AMBULATORY_CARE_PROVIDER_SITE_OTHER): Payer: Medicaid Other | Admitting: Family Medicine

## 2018-03-10 ENCOUNTER — Other Ambulatory Visit: Payer: Self-pay

## 2018-03-10 VITALS — BP 106/66 | HR 89 | Temp 98.0°F | Resp 14 | Ht 59.0 in | Wt 153.0 lb

## 2018-03-10 DIAGNOSIS — N920 Excessive and frequent menstruation with regular cycle: Secondary | ICD-10-CM

## 2018-03-10 DIAGNOSIS — D649 Anemia, unspecified: Secondary | ICD-10-CM | POA: Diagnosis not present

## 2018-03-10 DIAGNOSIS — E119 Type 2 diabetes mellitus without complications: Secondary | ICD-10-CM

## 2018-03-10 NOTE — Telephone Encounter (Signed)
You saw patient today. She received a letter. Would you like for me to refill it and if so, for how long?

## 2018-03-10 NOTE — Patient Instructions (Signed)
No change in medicine See the GYN for the heavy periods I am ordering tests  Follow up in six months Call now for an appointment

## 2018-03-10 NOTE — Progress Notes (Signed)
Chief Complaint  Patient presents with  . Diabetes    follow up   Patient is here for routine follow-up. She had diabetes several months back with a hemoglobin A1c over 10.  She was placed on metformin and sent for diabetes education.  She lost weight.  She is compliant with the diet.  Her last 2 hemoglobin A1c's have been under 6. I discussed her that I am leaving the practice.  She plans to go to the [facility].  I told her she needs to call their ASAP for an appointment.  She is only seen every 6 months, but do not want to be running out of medicines before you call.  She does have enough metformin to last her until November. Her only complaint is that she is chewing a lot of ice.  I told her this is usually seen with iron deficiency.  She does have a history of anemia from menorrhagia.  I recommend that she go for a GYN consultation to discuss methods of reducing the menstrual flow.  I specifically recommended to her a IUD or implant as they have the easiest to manage.  If she is certain she does not want any more children she could have an ablation.   Patient Active Problem List   Diagnosis Date Noted  . Diabetes mellitus without complication ([place]) 72/62/0355  . GAD (generalized anxiety disorder) 03/05/2017  . History of learning disability 03/05/2017  . Obesity (BMI 30-39.9) 03/05/2017    Outpatient Encounter Medications as of 03/10/2018  Medication Sig  . ACCU-CHEK AVIVA PLUS test strip USE UP TO FOUR TIMES DAILY AS DIRECTED  . acetaminophen (TYLENOL) 500 MG tablet Take 1,000 mg by mouth every 6 (six) hours as needed.  . blood glucose meter kit and supplies KIT Dispense based on patient and insurance preference. Use up to four times daily as directed. (FOR ICD-9 250.00, 250.01).  Elmore Guise Devices (ACCU-CHEK SOFTCLIX) lancets 1 each by Other route 2 (two) times daily. Use as instructed  . metFORMIN (GLUCOPHAGE) 500 MG tablet Take 1 tablet (500 mg total) by mouth 2 (two) times  daily with a meal.   No facility-administered encounter medications on file as of 03/10/2018.     No Known Allergies  Review of Systems  Constitutional: Negative for activity change, appetite change and unexpected weight change.  HENT: Negative for congestion, dental problem, postnasal drip and rhinorrhea.   Eyes: Negative for redness and visual disturbance.  Respiratory: Negative for cough and shortness of breath.   Cardiovascular: Negative for chest pain, palpitations and leg swelling.  Gastrointestinal: Negative for abdominal pain, constipation and diarrhea.  Endocrine:       Chewing on ice  Genitourinary: Positive for menstrual problem and vaginal bleeding. Negative for difficulty urinating and frequency.  Musculoskeletal: Negative for arthralgias and back pain.  Neurological: Negative for dizziness and headaches.  Psychiatric/Behavioral: Negative for dysphoric mood and sleep disturbance. The patient is not nervous/anxious.     Physical Exam  Constitutional: She is oriented to person, place, and time. She appears well-developed and well-nourished.  HENT:  Head: Normocephalic and atraumatic.  Mouth/Throat: Oropharynx is clear and moist.  Eyes: Pupils are equal, round, and reactive to light. Conjunctivae are normal.  Wears glasses.  Neck: Normal range of motion. Neck supple. No thyromegaly present.  Cardiovascular: Normal rate, regular rhythm and normal heart sounds.  Pulmonary/Chest: Effort normal and breath sounds normal. No respiratory distress.  Abdominal: Soft. Bowel sounds are normal.  Musculoskeletal:  Normal range of motion. She exhibits no edema.  Lymphadenopathy:    She has no cervical adenopathy.  Neurological: She is alert and oriented to person, place, and time.  Gait normal  Skin: Skin is warm and dry.  Psychiatric: She has a normal mood and affect. Her behavior is normal.  Slow responses. Eager to [employer]  Nursing note and vitals reviewed.   BP 106/66    Pulse 89   Temp 98 F (36.7 C) (Oral)   Resp 14   Ht _0  (1.499 m)   Wt 153 lb (69.4 kg)   SpO2 98%   BMI 30.90 kg/m     ASSESSMENT/PLAN:  1. Diabetes mellitus without complication ([place]) Well-controlled - Hemoglobin A1c - Lipid panel - Urinalysis, Routine w reflex microscopic - VITAMIN D 25 Hydroxy (Vit-D Deficiency, Fractures)  2. Anemia, unspecified type Likely chronic blood loss from menorrhagia - CBC with Differential/Platelet - Fe+TIBC+Fer - Ambulatory referral to Gynecology  3. Menorrhagia with regular cycle Refer to GYN.  Discussed methods of reducing menstrual flow - Ambulatory referral to Gynecology   Patient Instructions  No change in medicine See the GYN for the heavy periods I am ordering tests  Follow up in six months Call now for an appointment   Raylene Everts, MD

## 2018-03-10 NOTE — Telephone Encounter (Signed)
I will take care of it.

## 2018-03-10 NOTE — Telephone Encounter (Signed)
Thank you :)

## 2018-03-10 NOTE — Telephone Encounter (Signed)
Request 90 day refill on : metFORMIN (GLUCOPHAGE) 500 MG tablet [[id number]]   Please call to [facility]  On [address]

## 2018-03-17 LAB — CBC WITH DIFFERENTIAL/PLATELET
BASOS PCT: 0.6 %
Basophils Absolute: 64 cells/uL (ref 0–200)
EOS PCT: 1.9 %
Eosinophils Absolute: 203 cells/uL (ref 15–500)
HEMATOCRIT: 29.1 % — AB (ref 35.0–45.0)
Hemoglobin: 9 g/dL — ABNORMAL LOW (ref 11.7–15.5)
LYMPHS ABS: 3563 {cells}/uL (ref 850–3900)
MCH: 23.6 pg — ABNORMAL LOW (ref 27.0–33.0)
MCHC: 30.9 g/dL — ABNORMAL LOW (ref 32.0–36.0)
MCV: 76.2 fL — ABNORMAL LOW (ref 80.0–100.0)
MPV: 10.8 fL (ref 7.5–12.5)
Monocytes Relative: 6.5 %
NEUTROS ABS: 6174 {cells}/uL (ref 1500–7800)
Neutrophils Relative %: 57.7 %
Platelets: 386 10*3/uL (ref 140–400)
RBC: 3.82 10*6/uL (ref 3.80–5.10)
RDW: 15 % (ref 11.0–15.0)
Total Lymphocyte: 33.3 %
WBC mixed population: 696 cells/uL (ref 200–950)
WBC: 10.7 10*3/uL (ref 3.8–10.8)

## 2018-03-17 LAB — LIPID PANEL
Cholesterol: 97 mg/dL (ref <200)
HDL: 40 mg/dL — ABNORMAL LOW (ref >50)
LDL Cholesterol (Calc): 44 mg/dL (calc)
NON-HDL CHOLESTEROL (CALC): 57 mg/dL (ref <130)
TRIGLYCERIDES: 51 mg/dL (ref <150)
Total CHOL/HDL Ratio: 2.4 (calc) (ref <5.0)

## 2018-03-17 LAB — IRON,TIBC AND FERRITIN PANEL
%SAT: 5 % — AB (ref 11–50)
FERRITIN: 5 ng/mL — AB (ref 10–154)
Iron: 20 ug/dL — ABNORMAL LOW (ref 40–190)
TIBC: 390 ug/dL (ref 250–450)

## 2018-03-18 LAB — VITAMIN D 25 HYDROXY (VIT D DEFICIENCY, FRACTURES): Vit D, 25-Hydroxy: 12 ng/mL — ABNORMAL LOW (ref 30–100)

## 2018-03-18 LAB — HEMOGLOBIN A1C
EAG (MMOL/L): 6.3 (calc)
Hgb A1c MFr Bld: 5.6 % of total Hgb (ref <5.7)
MEAN PLASMA GLUCOSE: 114 (calc)

## 2018-04-07 ENCOUNTER — Encounter: Payer: Medicaid Other | Admitting: Adult Health

## 2018-04-26 ENCOUNTER — Encounter: Payer: Medicaid Other | Admitting: Adult Health

## 2018-05-11 ENCOUNTER — Encounter: Payer: Medicaid Other | Admitting: Adult Health

## 2018-05-17 ENCOUNTER — Encounter: Payer: Self-pay | Admitting: *Deleted

## 2018-08-10 DIAGNOSIS — E669 Obesity, unspecified: Secondary | ICD-10-CM | POA: Diagnosis not present

## 2018-08-10 DIAGNOSIS — Z23 Encounter for immunization: Secondary | ICD-10-CM | POA: Diagnosis not present

## 2018-08-10 DIAGNOSIS — E559 Vitamin D deficiency, unspecified: Secondary | ICD-10-CM | POA: Diagnosis not present

## 2018-08-10 DIAGNOSIS — N920 Excessive and frequent menstruation with regular cycle: Secondary | ICD-10-CM | POA: Diagnosis not present

## 2018-08-10 DIAGNOSIS — R5383 Other fatigue: Secondary | ICD-10-CM | POA: Diagnosis not present

## 2018-08-10 DIAGNOSIS — E119 Type 2 diabetes mellitus without complications: Secondary | ICD-10-CM | POA: Diagnosis not present

## 2018-08-10 DIAGNOSIS — Z13220 Encounter for screening for lipoid disorders: Secondary | ICD-10-CM | POA: Diagnosis not present

## 2018-08-10 DIAGNOSIS — I10 Essential (primary) hypertension: Secondary | ICD-10-CM | POA: Diagnosis not present

## 2018-08-10 DIAGNOSIS — R030 Elevated blood-pressure reading, without diagnosis of hypertension: Secondary | ICD-10-CM | POA: Diagnosis not present

## 2018-09-01 DIAGNOSIS — H5203 Hypermetropia, bilateral: Secondary | ICD-10-CM | POA: Diagnosis not present

## 2018-09-01 DIAGNOSIS — H52223 Regular astigmatism, bilateral: Secondary | ICD-10-CM | POA: Diagnosis not present

## 2018-09-07 DIAGNOSIS — H5213 Myopia, bilateral: Secondary | ICD-10-CM | POA: Diagnosis not present

## 2018-09-08 ENCOUNTER — Ambulatory Visit (INDEPENDENT_AMBULATORY_CARE_PROVIDER_SITE_OTHER): Payer: Medicaid Other | Admitting: Adult Health

## 2018-09-08 ENCOUNTER — Encounter: Payer: Self-pay | Admitting: Adult Health

## 2018-09-08 ENCOUNTER — Other Ambulatory Visit (HOSPITAL_COMMUNITY)
Admission: RE | Admit: 2018-09-08 | Discharge: 2018-09-08 | Disposition: A | Discharge status: Home / Self Care | Payer: Medicaid Other | Source: Ambulatory Visit | Attending: Adult Health | Admitting: Adult Health

## 2018-09-08 VITALS — BP 143/92 | HR 69 | Ht <= 58 in | Wt 161.5 lb

## 2018-09-08 DIAGNOSIS — N920 Excessive and frequent menstruation with regular cycle: Secondary | ICD-10-CM | POA: Insufficient documentation

## 2018-09-08 DIAGNOSIS — Z01419 Encounter for gynecological examination (general) (routine) without abnormal findings: Secondary | ICD-10-CM | POA: Insufficient documentation

## 2018-09-08 DIAGNOSIS — F32A Depression, unspecified: Secondary | ICD-10-CM

## 2018-09-08 DIAGNOSIS — Z124 Encounter for screening for malignant neoplasm of cervix: Secondary | ICD-10-CM | POA: Insufficient documentation

## 2018-09-08 DIAGNOSIS — Z0001 Encounter for general adult medical examination with abnormal findings: Secondary | ICD-10-CM

## 2018-09-08 DIAGNOSIS — F329 Major depressive disorder, single episode, unspecified: Secondary | ICD-10-CM | POA: Diagnosis not present

## 2018-09-08 DIAGNOSIS — N898 Other specified noninflammatory disorders of vagina: Secondary | ICD-10-CM | POA: Diagnosis not present

## 2018-09-08 DIAGNOSIS — A599 Trichomoniasis, unspecified: Secondary | ICD-10-CM | POA: Diagnosis not present

## 2018-09-08 HISTORY — DX: Encounter for gynecological examination (general) (routine) without abnormal findings: Z01.419

## 2018-09-08 LAB — POCT WET PREP (WET MOUNT): WBC, Wet Prep HPF POC: POSITIVE

## 2018-09-08 MED ORDER — METRONIDAZOLE 500 MG PO TABS: ORAL_TABLET | ORAL | 0 refills | Status: DC

## 2018-09-08 NOTE — Progress Notes (Signed)
Patient ID: ABRIA VANNOSTRAND, female   DOB: 02-Nov-1981, 37 y.o.   MRN: [id number] History of Present Illness: Wilbur is a 37 year old black female, single, G2P1 in for well woman gyn exam and pap. PCP is Dr Anastasio Champion.   Current Medications, Allergies, Past Medical History, Past Surgical History, Family History and Social History were reviewed in [employer] record.     Review of Systems: Patient denies any headaches, hearing loss, fatigue, blurred vision, shortness of breath, chest pain, abdominal pain, problems with bowel movements, urination, or intercourse. No joint pain or mood swings. Has vaginal discharge some itching, and odor Periods heavy at times, changes pads every 2 hours and had clots and moderate cramps, esp  end of cycle She thinks she has had surgery ?on cervix like laser, not ablation of uterus ??    Physical Exam:BP (!) 143/92 (BP Location: Left Arm, Patient Position: Sitting, Cuff Size: Normal)   Pulse 69   Ht 4\' 9"  (1.448 m)   Wt 161 lb 8 oz (73.3 kg)   LMP 08/27/2018 (Approximate)   BMI 34.95 kg/m  General:  Well developed, well nourished, no acute distress Skin:  Warm and dry Neck:  Midline trachea, normal thyroid, good ROM, no lymphadenopathy Lungs; Clear to auscultation bilaterally Breast:  No dominant palpable mass, retraction, or nipple discharge Cardiovascular: Regular rate and rhythm Abdomen:  Soft, non tender, no hepatosplenomegaly Pelvic:  External genitalia is normal in appearance, no lesions.  The vagina is normal in appearance.Has frothy discharge, wet prep, +WBC and +trich. Urethra has no lesions or masses. The cervix is bulbous. Pap with HPV and GC/CH performed. Uterus is felt to be normal size, shape, and contour.  No adnexal masses or tenderness noted.Bladder is non tender, no masses felt. Rectal: Good sphincter tone, no polyps, or hemorrhoids felt.  Hemoccult negative. Extremities/musculoskeletal:  No swelling or varicosities  noted, no clubbing or cyanosis Psych:  No mood changes, alert and cooperative,seems happy PHQ 9 score 14, denies being suicidal , is depressed,decliens meds  Examination chaperoned by Levy Pupa LPN Will get [place] and request records from [facility] any surgeries in past.   Impression: 1. Encounter for gynecological examination with Papanicolaou smear of cervix   2. Routine cervical smear   3. Trichimoniasis   4. Menorrhagia with regular cycle   5. Depression, unspecified depression type   6. Vaginal discharge       Plan: Meds ordered this encounter  Medications  . metroNIDAZOLE (FLAGYL) 500 MG tablet    Sig: Take 4 po now    Dispense:  4 tablet    Refill:  0    Order Specific Question:   Supervising Provider    Answer:   Florian Buff [[id number]]  And same for partner Curly Rim Review handouts on trich No sex Use condoms Return in 1 week for GYN [place] and then in 10 days for POC Will discuss BC options after [place] Physical in 1 year Pap in 3 if normal Mammogram at 20 Labs with PCP

## 2018-09-08 NOTE — Patient Instructions (Signed)

## 2018-09-09 LAB — CYTOLOGY - PAP
Chlamydia: NEGATIVE
Diagnosis: NEGATIVE
HPV (WINDOPATH): NOT DETECTED
NEISSERIA GONORRHEA: NEGATIVE

## 2018-09-14 ENCOUNTER — Ambulatory Visit (INDEPENDENT_AMBULATORY_CARE_PROVIDER_SITE_OTHER): Payer: Medicaid Other

## 2018-09-14 DIAGNOSIS — N920 Excessive and frequent menstruation with regular cycle: Secondary | ICD-10-CM | POA: Diagnosis not present

## 2018-09-14 NOTE — Progress Notes (Signed)
PELVIC US TA/TV:heterogeneous anteverted uterus with a anterior left subserosal fibroid 3.4 x 3.2 x 3.5 cm,EEC 17 mm,normal ovaries bilat,no free fluid,mult simple nabothian cysts,ovaries appear mobile

## 2018-09-19 ENCOUNTER — Ambulatory Visit (INDEPENDENT_AMBULATORY_CARE_PROVIDER_SITE_OTHER): Payer: Medicaid Other | Admitting: Adult Health

## 2018-09-19 ENCOUNTER — Encounter: Payer: Self-pay | Admitting: Adult Health

## 2018-09-19 VITALS — BP 145/96 | HR 78 | Ht <= 58 in | Wt 160.5 lb

## 2018-09-19 DIAGNOSIS — Z8619 Personal history of other infectious and parasitic diseases: Secondary | ICD-10-CM | POA: Insufficient documentation

## 2018-09-19 DIAGNOSIS — N920 Excessive and frequent menstruation with regular cycle: Secondary | ICD-10-CM | POA: Diagnosis not present

## 2018-09-19 DIAGNOSIS — D252 Subserosal leiomyoma of uterus: Secondary | ICD-10-CM | POA: Diagnosis not present

## 2018-09-19 LAB — POCT WET PREP (WET MOUNT): Trichomonas Wet Prep HPF POC: ABSENT

## 2018-09-19 NOTE — Patient Instructions (Signed)
Laparoscopic Tubal Ligation Laparoscopic tubal ligation is a procedure to close the fallopian tubes. This is done so that you cannot get pregnant. When the fallopian tubes are closed, the eggs that your ovaries release cannot enter the uterus, and sperm cannot reach the released eggs. A laparoscopic tubal ligation is sometimes called "getting your tubes tied." You should not have this procedure if you want to get pregnant someday or if you are unsure about having more children. Tell a health care provider about:  Any allergies you have.  All medicines you are taking, including vitamins, herbs, eye drops, creams, and over-the-counter medicines.  Any problems you or family members have had with anesthetic medicines.  Any blood disorders you have.  Any surgeries you have had.  Any medical conditions you have.  Whether you are pregnant or may be pregnant.  Any past pregnancies. What are the risks? Generally, this is a safe procedure. However, problems may occur, including:  Infection.  Bleeding.  Injury to surrounding organs.  Side effects from anesthetics.  Failure of the procedure.  This procedure can increase your risk of a kind of pregnancy in which a fertilized egg attaches to the outside of the uterus (ectopic pregnancy). What happens before the procedure?  Ask your health care provider about: ? Changing or stopping your regular medicines. This is especially important if you are taking diabetes medicines or blood thinners. ? Taking medicines such as aspirin and ibuprofen. These medicines can thin your blood. Do not take these medicines before your procedure if your health care provider instructs you not to.  Follow instructions from your health care provider about eating and drinking restrictions.  Plan to have someone take you home after the procedure.  If you go home right after the procedure, plan to have someone with you for 24 hours. What happens during the  procedure?  You will be given one or more of the following: ? A medicine to help you relax (sedative). ? A medicine to numb the area (local anesthetic). ? A medicine to make you fall asleep (general anesthetic). ? A medicine that is injected into an area of your body to numb everything below the injection site (regional anesthetic).  An IV tube will be inserted into one of your veins. It will be used to give you medicines and fluids during the procedure.  Your bladder may be emptied with a small tube (catheter).  If you have been given a general anesthetic, a tube will be put down your throat to help you breathe.  Two small cuts (incisions) will be made in your lower abdomen and near your belly button.  Your abdomen will be inflated with a gas. This will let the surgeon see better and will give the surgeon room to work.  A thin, lighted tube (laparoscope) with a camera attached will be inserted into your abdomen through one of the incisions. Small instruments will be inserted through the other incision.  The fallopian tubes will be tied off, burned (cauterized), or blocked with a clip, ring, or clamp. A small portion in the center of each fallopian tube may be removed.  The gas will be released from the abdomen.  The incisions will be closed with stitches (sutures).  A bandage (dressing) will be placed over the incisions. The procedure may vary among health care providers and hospitals. What happens after the procedure?  Your blood pressure, heart rate, breathing rate, and blood oxygen level will be monitored often until the medicines you   were given have worn off.  You will be given medicine to help with pain, nausea, and vomiting as needed. This information is not intended to replace advice given to you by your health care provider. Make sure you discuss any questions you have with your health care provider. Document Released: 02/15/2001 Document Revised: 04/16/2016 Document  Reviewed: 10/20/2015 Elsevier Interactive Patient Education  2018 [employer]. Uterine Fibroids Uterine fibroids are tissue masses (tumors) that can develop in the womb (uterus). They are also called leiomyomas. This type of tumor is not cancerous (benign) and does not spread to other parts of the body outside of the pelvic area, which is between the hip bones. Occasionally, fibroids may develop in the fallopian tubes, in the cervix, or on the support structures (ligaments) that surround the uterus. You can have one or many fibroids. Fibroids can vary in size, weight, and where they grow in the uterus. Some can become quite large. Most fibroids do not require medical treatment. What are the causes? A fibroid can develop when a single uterine cell keeps growing (replicating). Most cells in the human body have a control mechanism that keeps them from replicating without control. What are the signs or symptoms? Symptoms may include:  Heavy bleeding during your period.  Bleeding or spotting between periods.  Pelvic pain and pressure.  Bladder problems, such as needing to urinate more often (urinary frequency) or urgently.  Inability to reproduce offspring (infertility).  Miscarriages.  How is this diagnosed? Uterine fibroids are diagnosed through a physical exam. Your health care provider may feel the lumpy tumors during a pelvic exam. Ultrasonography and an MRI may be done to determine the size, location, and number of fibroids. How is this treated? Treatment may include:  Watchful waiting. This involves getting the fibroid checked by your health care provider to see if it grows or shrinks. Follow your health care provider's recommendations for how often to have this checked.  Hormone medicines. These can be taken by mouth or given through an intrauterine device (IUD).  Surgery. ? Removing the fibroids (myomectomy) or the uterus (hysterectomy). ? Removing blood supply to the fibroids  (uterine artery embolization).  If fibroids interfere with your fertility and you want to become pregnant, your health care provider may recommend having the fibroids removed. Follow these instructions at home:  Keep all follow-up visits as directed by your health care provider. This is important.  Take over-the-counter and prescription medicines only as told by your health care provider. ? If you were prescribed a hormone treatment, take the hormone medicines exactly as directed.  Ask your health care provider about taking iron pills and increasing the amount of dark green, leafy vegetables in your diet. These actions can help to boost your blood iron levels, which may be affected by heavy menstrual bleeding.  Pay close attention to your period and tell your health care provider about any changes, such as: ? Increased blood flow that requires you to use more pads or tampons than usual per month. ? A change in the number of days that your period lasts per month. ? A change in symptoms that are associated with your period, such as abdominal cramping or back pain. Contact a health care provider if:  You have pelvic pain, back pain, or abdominal cramps that cannot be controlled with medicines.  You have an increase in bleeding between and during periods.  You soak tampons or pads in a half hour or less.  You feel lightheaded, extra  tired, or weak. Get help right away if:  You faint.  You have a sudden increase in pelvic pain. This information is not intended to replace advice given to you by your health care provider. Make sure you discuss any questions you have with your health care provider. Document Released: 11/06/2000 Document Revised: 07/09/2016 Document Reviewed: 05/08/2014 Elsevier Interactive Patient Education  [employer].

## 2018-09-19 NOTE — Progress Notes (Signed)
  Subjective:     Patient ID: Debra Tanner, female   DOB: 08/16/1981, 37 y.o.   MRN: [id number]  HPI Debra Tanner is a 37 year old black female in for proof of cure for recent +trich, and review [place] results.  PCP is Dr Anastasio Champion.   Review of Systems +heavy periods and clots at times Has not has sex since taking meds Has some nausea, vomiting and diarrhea earlier today, better now.  Reviewed past medical,surgical, social and family history. Reviewed medications and allergies.     Objective:   Physical Exam BP (!) 145/96 (BP Location: Left Arm, Patient Position: Sitting, Cuff Size: Normal)   Pulse 78   Ht 4\' 9"  (1.448 m)   Wt 160 lb 8 oz (72.8 kg)   LMP 08/27/2018 (Approximate)   BMI 34.73 kg/m  Skin warm and dry.Pelvic: external genitalia is normal in appearance no lesions, vagina:scant white discharge without odor,urethra has no lesions or masses noted, cervix:smooth and bulbous, uterus: normal size, shape and contour, non tender, no masses felt, adnexa: no masses or tenderness noted. Bladder is non tender and no masses felt. Wet prep:  +few WBCs. Reviewed US;ovaries normal, EEC 17 mm, and has subserosal fibroid. Discussed options of OCs,depo, IUD, nexplanon, tubal and IUD, or just following and use condoms. She has used depo in the past and may consider that again.  No surgical notes except for laser on cervix in 2012.  Face time 15 minutes with 50% counseling, on options.     Assessment:     1. History of trichomoniasis   2. Menorrhagia with regular cycle   3. Fibroids, subserous       Plan:     Use condoms Review handouts on ablation, tubal ligation and IUD F/U prn

## 2018-09-21 DIAGNOSIS — H5203 Hypermetropia, bilateral: Secondary | ICD-10-CM | POA: Diagnosis not present

## 2018-09-21 DIAGNOSIS — H52223 Regular astigmatism, bilateral: Secondary | ICD-10-CM | POA: Diagnosis not present

## 2018-10-11 DIAGNOSIS — D649 Anemia, unspecified: Secondary | ICD-10-CM | POA: Diagnosis not present

## 2018-10-11 DIAGNOSIS — E669 Obesity, unspecified: Secondary | ICD-10-CM | POA: Diagnosis not present

## 2018-10-11 DIAGNOSIS — N920 Excessive and frequent menstruation with regular cycle: Secondary | ICD-10-CM | POA: Diagnosis not present

## 2018-10-11 DIAGNOSIS — E119 Type 2 diabetes mellitus without complications: Secondary | ICD-10-CM | POA: Diagnosis not present

## 2018-12-01 ENCOUNTER — Telehealth: Payer: Self-pay | Admitting: Adult Health

## 2018-12-01 NOTE — Telephone Encounter (Signed)
Patient called stating that she would like for [facility] to call her in an antibiotic to the [facility] on [address]. Please contact pt when done.

## 2018-12-02 NOTE — Telephone Encounter (Signed)
Pt called stating that she is having the same symptoms she had in October when she was diagnosed with trich. She c/o vaginal discharge with a foul odor. She is requesting that an antibiotic be sent in for her. Advised that we could not send any prescriptions in until she is evaluated by a provider and the cause of the discharge is determined. Pt verbalized understanding and connected to scheduling for an appt.

## 2018-12-05 ENCOUNTER — Ambulatory Visit: Payer: Medicaid Other | Admitting: Advanced Practice Midwife

## 2018-12-08 ENCOUNTER — Ambulatory Visit (INDEPENDENT_AMBULATORY_CARE_PROVIDER_SITE_OTHER): Payer: Medicaid Other | Admitting: Advanced Practice Midwife

## 2018-12-08 ENCOUNTER — Encounter: Payer: Self-pay | Admitting: Advanced Practice Midwife

## 2018-12-08 VITALS — BP 146/100 | HR 77 | Ht <= 58 in | Wt 158.0 lb

## 2018-12-08 DIAGNOSIS — N898 Other specified noninflammatory disorders of vagina: Secondary | ICD-10-CM | POA: Diagnosis not present

## 2018-12-08 MED ORDER — METRONIDAZOLE 500 MG PO TABS: 500.0000 mg | ORAL_TABLET | Freq: Two times a day (BID) | ORAL | 0 refills | Status: DC

## 2018-12-08 NOTE — Progress Notes (Signed)
[facility] Visit  Patient name: Debra Tanner MRN [id number]  Date of birth: 1981/06/11  CC & HPI:  Debra Tanner is a 38 y.o. African American female presenting today for vaginal irritation. Had trich in October, took meds. Had neg POC. Partner may not have gotten treated.  Had sex again in November,  Feels like the same sx for a month or so.   Pertinent History Reviewed:  Medical & Surgical Hx:   Past Medical History:  Diagnosis Date  . Anemia   . Depression   . Diabetes mellitus without complication ([place])   . GAD (generalized anxiety disorder)   . Hypertension   . Trichimoniasis    No past surgical history on file. Family History  Problem Relation Age of Onset  . Alcohol abuse Mother   . Cirrhosis Mother   . Alcohol abuse Father   . Heart disease Father   . Hypertension Father   . Diabetes Sister   . Sickle cell trait Sister   . Hypertension Maternal Grandmother   . Hyperlipidemia Maternal Grandmother   . Cancer Maternal Grandfather        lung  . Cancer Paternal Grandfather   . Hypertension Paternal Grandmother     Current Outpatient Medications:  .  ACCU-CHEK AVIVA PLUS test strip, USE UP TO FOUR TIMES DAILY AS DIRECTED, Disp: 200 each, Rfl: 6 .  blood glucose meter kit and supplies KIT, Dispense based on patient and insurance preference. Use up to four times daily as directed. (FOR ICD-9 250.00, 250.01)., Disp: 1 each, Rfl: 0 .  chlorpheniramine-HYDROcodone (TUSSIONEX) 10-8 MG/5ML SUER, Take 5 mLs by mouth., Disp: , Rfl:  .  Lancet Devices (ACCU-CHEK SOFTCLIX) lancets, 1 each by Other route 2 (two) times daily. Use as instructed, Disp: 100 each, Rfl: 7 .  metFORMIN (GLUCOPHAGE) 500 MG tablet, Take 1 tablet (500 mg total) by mouth 2 (two) times daily with a meal., Disp: 180 tablet, Rfl: 3 .  acetaminophen (TYLENOL) 500 MG tablet, Take 1,000 mg by mouth every 6 (six) hours as needed., Disp: , Rfl:  .  metroNIDAZOLE (FLAGYL) 500 MG tablet, Take 1 tablet  (500 mg total) by mouth 2 (two) times daily., Disp: 14 tablet, Rfl: 0 Social History: Reviewed -  reports that she has never smoked. She has never used smokeless tobacco.  Review of Systems:   Constitutional: Negative for fever and chills Eyes: Negative for visual disturbances Respiratory: Negative for shortness of breath, dyspnea Cardiovascular: Negative for chest pain or palpitations  Gastrointestinal: Negative for vomiting, diarrhea and constipation; no abdominal pain Genitourinary: Negative for dysuria and urgency, vaginal irritation or itching Musculoskeletal: Negative for back pain, joint pain, myalgias  Neurological: Negative for dizziness and headaches    Objective Findings:    Physical Examination: Vitals:   12/08/18 1550  BP: (!) 146/100  Pulse: 77   General appearance - well appearing, and in no distress Mental status - alert, oriented to person, place, and time Chest:  Normal respiratory effort Heart - normal rate and regular rhythm Abdomen:  Soft, nontender Pelvic: deferred, on period.  willl send urine and treat, pt seems sure this is what it is.  Musculoskeletal:  Normal range of motion without pain Extremities:  No edema    No results found for this or any previous visit (from the past 24 hour(s)).    Assessment & Plan:  A:   Probable reinfection of trich P:  Send urine for trich, treat w/flagyl  No follow-ups on file.  Christin Fudge CNM 12/08/2018 4:26 PM

## 2018-12-10 LAB — TRICHOMONAS VAGINALIS, PROBE AMP: TRICH VAG BY NAA: NEGATIVE

## 2018-12-14 ENCOUNTER — Telehealth: Payer: Self-pay | Admitting: Advanced Practice Midwife

## 2018-12-14 NOTE — Telephone Encounter (Signed)
Patient called, she'd like lab results.  [phone]

## 2018-12-14 NOTE — Telephone Encounter (Signed)
LMOM that labs were negative.

## 2018-12-15 DIAGNOSIS — R21 Rash and other nonspecific skin eruption: Secondary | ICD-10-CM | POA: Diagnosis not present

## 2018-12-15 DIAGNOSIS — D649 Anemia, unspecified: Secondary | ICD-10-CM | POA: Diagnosis not present

## 2018-12-15 DIAGNOSIS — E119 Type 2 diabetes mellitus without complications: Secondary | ICD-10-CM | POA: Diagnosis not present

## 2018-12-15 DIAGNOSIS — N920 Excessive and frequent menstruation with regular cycle: Secondary | ICD-10-CM | POA: Diagnosis not present

## 2018-12-15 DIAGNOSIS — E669 Obesity, unspecified: Secondary | ICD-10-CM | POA: Diagnosis not present

## 2019-02-02 DIAGNOSIS — I10 Essential (primary) hypertension: Secondary | ICD-10-CM | POA: Diagnosis not present

## 2019-02-02 DIAGNOSIS — D649 Anemia, unspecified: Secondary | ICD-10-CM | POA: Diagnosis not present

## 2019-02-02 DIAGNOSIS — R0683 Snoring: Secondary | ICD-10-CM | POA: Diagnosis not present

## 2019-02-02 DIAGNOSIS — R21 Rash and other nonspecific skin eruption: Secondary | ICD-10-CM | POA: Diagnosis not present

## 2019-02-09 ENCOUNTER — Encounter (INDEPENDENT_AMBULATORY_CARE_PROVIDER_SITE_OTHER): Payer: Self-pay | Admitting: Internal Medicine

## 2019-02-20 ENCOUNTER — Encounter (INDEPENDENT_AMBULATORY_CARE_PROVIDER_SITE_OTHER): Payer: Self-pay | Admitting: Internal Medicine

## 2019-02-20 ENCOUNTER — Ambulatory Visit (INDEPENDENT_AMBULATORY_CARE_PROVIDER_SITE_OTHER): Payer: Medicaid Other | Admitting: Internal Medicine

## 2019-02-20 ENCOUNTER — Other Ambulatory Visit: Payer: Self-pay

## 2019-02-20 VITALS — BP 120/83 | HR 72 | Temp 97.6°F | Ht 59.0 in | Wt 157.9 lb

## 2019-02-20 DIAGNOSIS — D508 Other iron deficiency anemias: Secondary | ICD-10-CM | POA: Diagnosis not present

## 2019-02-20 NOTE — Patient Instructions (Addendum)
3 stool cards home with patient. Iron 2 tabs a day. H and H in 1 months.  Follow up with GYN concerning your heavy periods.

## 2019-02-20 NOTE — Progress Notes (Signed)
   Subjective:    Patient ID: Debra Tanner, female    DOB: Mar 24, 1981, 38 y.o.   MRN: [id number]  HPI Referred by Dr. Anastasio Champion for IDA. Hx of same dating back  At least to 2018 per Epic. Hx of same per patient.  Presently taking Iron for her anemia. She denies any rectal bleeding. Has had some dark stools with the Iron.  No family hx of colon cancer. No abdominal pain. Her periods are heavy. She has clots. Her periods last about 6 days.  Her last GYN exam in October of 2019 by [facility].  Appetite is good. No unintentional weight loss. No GI symptoms.    02/02/2019 H and H 10.2 and 332.6, Iron 30, TIBC 386, % Sat 8, Ferritin 6.     CBC Latest Ref Rng & Units 03/17/2018 09/09/2017 03/01/2017  WBC 3.8 - 10.8 Thousand/uL 10.7 10.2 14.9(H)  Hemoglobin 11.7 - 15.5 g/dL 9.0(L) 9.5(L) 11.7(L)  Hematocrit 35.0 - 45.0 % 29.1(L) 31.2(L) 35.8(L)  Platelets 140 - 400 Thousand/uL 386 425(H) 361        Review of Systems Past Medical History:  Diagnosis Date  . Anemia   . Depression   . Diabetes mellitus without complication ([place])   . GAD (generalized anxiety disorder)   . Hypertension   . Trichimoniasis     History reviewed. No pertinent surgical history.  No Known Allergies  Current Outpatient Medications on File Prior to Visit  Medication Sig Dispense Refill  . ACCU-CHEK AVIVA PLUS test strip USE UP TO FOUR TIMES DAILY AS DIRECTED 200 each 6  . acetaminophen (TYLENOL) 500 MG tablet Take 1,000 mg by mouth every 6 (six) hours as needed.    . blood glucose meter kit and supplies KIT Dispense based on patient and insurance preference. Use up to four times daily as directed. (FOR ICD-9 250.00, 250.01). 1 each 0  . chlorpheniramine-HYDROcodone (TUSSIONEX) 10-8 MG/5ML SUER Take 5 mLs by mouth.    . ferrous sulfate 325 (65 FE) MG tablet Take 325 mg by mouth daily with breakfast.    . Lancet Devices (ACCU-CHEK SOFTCLIX) lancets 1 each by Other route 2 (two) times daily. Use as  instructed 100 each 7  . metFORMIN (GLUCOPHAGE) 500 MG tablet Take 1 tablet (500 mg total) by mouth 2 (two) times daily with a meal. 180 tablet 3   No current facility-administered medications on file prior to visit.         Objective:   Physical Exam Blood pressure 120/83, pulse 72, temperature 97.6 F (36.4 C), height _0  (1.499 m), weight 157 lb 14.4 oz (71.6 kg). Alert and oriented. Skin warm and dry. Oral mucosa is moist.   . Sclera anicteric, conjunctivae is pink. Thyroid not enlarged. No cervical lymphadenopathy. Lungs clear. Heart regular rate and rhythm.  Abdomen is soft. Bowel sounds are positive. No hepatomegaly. No abdominal masses felt. No tenderness.  No edema to lower extremities.   Stool brown and guaiac negative. No rectal masses felt.         Assessment & Plan:  IDA. 3 stools card home with patient.  H and H in 1 month. Follow up with GYN for your heavy periods. Iron 2 tabs a day.   I discussed this case with Dr. Laural Golden.

## 2019-02-22 ENCOUNTER — Other Ambulatory Visit (INDEPENDENT_AMBULATORY_CARE_PROVIDER_SITE_OTHER): Payer: Self-pay | Admitting: *Deleted

## 2019-02-22 DIAGNOSIS — D509 Iron deficiency anemia, unspecified: Secondary | ICD-10-CM

## 2019-03-02 ENCOUNTER — Telehealth (INDEPENDENT_AMBULATORY_CARE_PROVIDER_SITE_OTHER): Payer: Self-pay | Admitting: *Deleted

## 2019-03-02 NOTE — Telephone Encounter (Signed)
   Diagnosis:    Result(s)   Card 1: Positive    Card 2:Negative:   Card 3:Negative:    Completed by: Thomas Hoff, LPN   HEMOCCULT SENSA DEVELOPER: LOT#:  [id number] EXPIRATION DATE: 2021-11   HEMOCCULT SENSA CARD:  LOT#:  [id number] EXPIRATION DATE: 05/21   CARD CONTROL RESULTS:  POSITIVE: Positive NEGATIVE: Negative    ADDITIONAL COMMENTS: Results sent to the ordering provider.

## 2019-03-08 DIAGNOSIS — D509 Iron deficiency anemia, unspecified: Secondary | ICD-10-CM | POA: Diagnosis not present

## 2019-03-15 ENCOUNTER — Encounter (INDEPENDENT_AMBULATORY_CARE_PROVIDER_SITE_OTHER): Payer: Self-pay | Admitting: *Deleted

## 2019-03-15 ENCOUNTER — Other Ambulatory Visit (INDEPENDENT_AMBULATORY_CARE_PROVIDER_SITE_OTHER): Payer: Self-pay | Admitting: *Deleted

## 2019-03-15 DIAGNOSIS — D509 Iron deficiency anemia, unspecified: Secondary | ICD-10-CM

## 2019-05-15 ENCOUNTER — Ambulatory Visit (INDEPENDENT_AMBULATORY_CARE_PROVIDER_SITE_OTHER): Payer: Medicaid Other | Admitting: Internal Medicine

## 2019-05-15 DIAGNOSIS — R5383 Other fatigue: Secondary | ICD-10-CM | POA: Diagnosis not present

## 2019-05-15 DIAGNOSIS — E559 Vitamin D deficiency, unspecified: Secondary | ICD-10-CM | POA: Diagnosis not present

## 2019-05-15 DIAGNOSIS — D649 Anemia, unspecified: Secondary | ICD-10-CM | POA: Diagnosis not present

## 2019-05-15 DIAGNOSIS — E119 Type 2 diabetes mellitus without complications: Secondary | ICD-10-CM | POA: Diagnosis not present

## 2019-05-15 DIAGNOSIS — D509 Iron deficiency anemia, unspecified: Secondary | ICD-10-CM | POA: Diagnosis not present

## 2019-05-15 DIAGNOSIS — I10 Essential (primary) hypertension: Secondary | ICD-10-CM | POA: Diagnosis not present

## 2019-06-06 DIAGNOSIS — D509 Iron deficiency anemia, unspecified: Secondary | ICD-10-CM | POA: Diagnosis not present

## 2019-06-06 DIAGNOSIS — E559 Vitamin D deficiency, unspecified: Secondary | ICD-10-CM | POA: Diagnosis not present

## 2019-06-06 DIAGNOSIS — I10 Essential (primary) hypertension: Secondary | ICD-10-CM | POA: Diagnosis not present

## 2019-06-06 DIAGNOSIS — E119 Type 2 diabetes mellitus without complications: Secondary | ICD-10-CM | POA: Diagnosis not present

## 2019-06-27 DIAGNOSIS — E119 Type 2 diabetes mellitus without complications: Secondary | ICD-10-CM | POA: Diagnosis not present

## 2019-06-27 DIAGNOSIS — I10 Essential (primary) hypertension: Secondary | ICD-10-CM | POA: Diagnosis not present

## 2019-09-12 ENCOUNTER — Ambulatory Visit (INDEPENDENT_AMBULATORY_CARE_PROVIDER_SITE_OTHER): Payer: Medicaid Other | Admitting: Internal Medicine

## 2019-09-20 ENCOUNTER — Other Ambulatory Visit (HOSPITAL_COMMUNITY)
Admission: RE | Admit: 2019-09-20 | Discharge: 2019-09-20 | Disposition: A | Discharge status: Home / Self Care | Payer: Medicaid Other | Source: Ambulatory Visit | Attending: Obstetrics & Gynecology | Admitting: Obstetrics & Gynecology

## 2019-09-20 ENCOUNTER — Other Ambulatory Visit (INDEPENDENT_AMBULATORY_CARE_PROVIDER_SITE_OTHER): Payer: Medicaid Other | Admitting: *Deleted

## 2019-09-20 ENCOUNTER — Other Ambulatory Visit: Payer: Self-pay

## 2019-09-20 DIAGNOSIS — N898 Other specified noninflammatory disorders of vagina: Secondary | ICD-10-CM

## 2019-09-20 DIAGNOSIS — N949 Unspecified condition associated with female genital organs and menstrual cycle: Secondary | ICD-10-CM | POA: Insufficient documentation

## 2019-09-20 NOTE — Progress Notes (Addendum)
   NURSE VISIT- VAGINITIS/STD/POC  SUBJECTIVE:  Debra Tanner is a 38 y.o. G2P1011 GYN patientfemale here for a vaginal swab for vaginitis screening, STD screen.  She reports the following symptoms: local irritation and vaginal burning for 3 weeks. Denies abnormal vaginal bleeding, significant pelvic pain, fever, or UTI symptoms.  OBJECTIVE:  There were no vitals taken for this visit.  Appears well, in no apparent distress  ASSESSMENT: Vaginal swab for vaginitis screening  PLAN: Self-collected vaginal probe for Gonorrhea, Chlamydia, Trichomonas, Bacterial Vaginosis, Yeast sent to lab Treatment: to be determined once results are received Follow-up as needed if symptoms persist/worsen, or new symptoms develop  Levy Pupa  09/20/2019 2:13 PM   Chart reviewed for nurse visit. Agree with plan of care.  Roma Schanz, [place] 09/21/2019 1:12 PM

## 2019-09-22 LAB — CERVICOVAGINAL ANCILLARY ONLY
Bacterial Vaginitis (gardnerella): NEGATIVE
Candida Glabrata: NEGATIVE
Candida Vaginitis: POSITIVE — AB
Chlamydia: NEGATIVE
Comment: NEGATIVE
Comment: NEGATIVE
Comment: NEGATIVE
Comment: NEGATIVE
Comment: NEGATIVE
Comment: NORMAL
Neisseria Gonorrhea: NEGATIVE
Trichomonas: NEGATIVE

## 2019-09-25 ENCOUNTER — Telehealth: Payer: Self-pay | Admitting: *Deleted

## 2019-09-25 ENCOUNTER — Other Ambulatory Visit: Payer: Self-pay | Admitting: Women's Health

## 2019-09-25 MED ORDER — FLUCONAZOLE 150 MG PO TABS: 150.0000 mg | ORAL_TABLET | Freq: Once | ORAL | 0 refills | Status: AC

## 2019-09-25 NOTE — Telephone Encounter (Signed)
Patient informed swab showed yeast. Can try Monistat or generic OTC. Pt states she has tried that but is requesting Diflucan be sent to pharmacy.

## 2019-09-25 NOTE — Telephone Encounter (Signed)
Patient left message requesting a call back to discuss the results of her swab.

## 2019-10-11 ENCOUNTER — Telehealth (INDEPENDENT_AMBULATORY_CARE_PROVIDER_SITE_OTHER): Payer: Medicaid Other | Admitting: Internal Medicine

## 2019-11-01 ENCOUNTER — Encounter: Payer: Self-pay | Admitting: Adult Health

## 2019-11-01 ENCOUNTER — Other Ambulatory Visit: Payer: Self-pay

## 2019-11-01 ENCOUNTER — Ambulatory Visit (INDEPENDENT_AMBULATORY_CARE_PROVIDER_SITE_OTHER): Payer: Medicaid Other | Admitting: Adult Health

## 2019-11-01 VITALS — BP 135/78 | HR 90 | Ht <= 58 in | Wt 164.0 lb

## 2019-11-01 DIAGNOSIS — N898 Other specified noninflammatory disorders of vagina: Secondary | ICD-10-CM | POA: Diagnosis not present

## 2019-11-01 DIAGNOSIS — B9689 Other specified bacterial agents as the cause of diseases classified elsewhere: Secondary | ICD-10-CM

## 2019-11-01 DIAGNOSIS — B379 Candidiasis, unspecified: Secondary | ICD-10-CM | POA: Diagnosis not present

## 2019-11-01 DIAGNOSIS — N760 Acute vaginitis: Secondary | ICD-10-CM

## 2019-11-01 LAB — POCT WET PREP (WET MOUNT)
Clue Cells Wet Prep Whiff POC: NEGATIVE
Trichomonas Wet Prep HPF POC: ABSENT
WBC, Wet Prep HPF POC: POSITIVE

## 2019-11-01 MED ORDER — METRONIDAZOLE 500 MG PO TABS: 500.0000 mg | ORAL_TABLET | Freq: Two times a day (BID) | ORAL | 0 refills | Status: DC

## 2019-11-01 MED ORDER — FLUCONAZOLE 150 MG PO TABS: ORAL_TABLET | ORAL | 1 refills | Status: DC

## 2019-11-01 NOTE — Progress Notes (Signed)
  Subjective:     Patient ID: Debra Tanner, female   DOB: 12/29/1980, 38 y.o.   MRN: [id number]  HPI Debra Tanner is a 38 year old black female, G2P1011, in complaining of vaginal irritation and itching, no odor but has yellowish discharge, has tried OTC miconazole 7 helped some.Had used a new soap.  PCP is Dr Anastasio Champion.  Review of Systems +vaginal irritation and itching Vaginal discharge, yellowish Denies  any odor Had tried new soap No new partner Reviewed past medical,surgical, social and family history. Reviewed medications and allergies.     Objective:   Physical Exam BP 135/78 (BP Location: Left Arm, Patient Position: Sitting, Cuff Size: Normal)   Pulse 90   Ht 4\' 9"  (1.448 m)   Wt 164 lb (74.4 kg)   LMP 10/17/2019   BMI 35.49 kg/m   Skin warm and dry.Pelvic: external genitalia is normal in appearance no lesions, vagina: white discharge without odor,urethra has no lesions or masses noted, cervix:smooth and bulbous, uterus: normal size, shape and contour, non tender, no masses felt, adnexa: no masses or tenderness noted. Bladder is non tender and no masses felt. Wet prep: + for clue cells and +WBCs, +yeast,no trich seen. Examination chaperoned by Diona Fanti CMA   She declines STD testing.  Assessment:     1. BV (bacterial vaginosis)   2. Vaginal irritation   3. Vaginal discharge   4. Yeast infection       Plan:     Will rx flagyl and diflucan  Meds ordered this encounter  Medications  . metroNIDAZOLE (FLAGYL) 500 MG tablet    Sig: Take 1 tablet (500 mg total) by mouth 2 (two) times daily.    Dispense:  14 tablet    Refill:  0    Order Specific Question:   Supervising Provider    Answer:   Elonda Husky, LUTHER H [[id number]]  . fluconazole (DIFLUCAN) 150 MG tablet    Sig: Take 1 now and thenm repeat 1 in 3 days    Dispense:  2 tablet    Refill:  1    Order Specific Question:   Supervising Provider    Answer:   Tania Ade H [[id number]]  Follow up prn

## 2019-11-07 ENCOUNTER — Other Ambulatory Visit: Payer: Self-pay

## 2019-11-07 ENCOUNTER — Ambulatory Visit (INDEPENDENT_AMBULATORY_CARE_PROVIDER_SITE_OTHER): Payer: Medicaid Other | Admitting: Internal Medicine

## 2019-11-07 ENCOUNTER — Encounter (INDEPENDENT_AMBULATORY_CARE_PROVIDER_SITE_OTHER): Payer: Self-pay | Admitting: Internal Medicine

## 2019-11-07 VITALS — BP 150/90 | HR 72 | Ht <= 58 in | Wt 161.2 lb

## 2019-11-07 DIAGNOSIS — E119 Type 2 diabetes mellitus without complications: Secondary | ICD-10-CM | POA: Diagnosis not present

## 2019-11-07 DIAGNOSIS — I10 Essential (primary) hypertension: Secondary | ICD-10-CM

## 2019-11-07 DIAGNOSIS — E669 Obesity, unspecified: Secondary | ICD-10-CM | POA: Diagnosis not present

## 2019-11-07 DIAGNOSIS — N920 Excessive and frequent menstruation with regular cycle: Secondary | ICD-10-CM | POA: Diagnosis not present

## 2019-11-07 DIAGNOSIS — Z23 Encounter for immunization: Secondary | ICD-10-CM | POA: Diagnosis not present

## 2019-11-07 DIAGNOSIS — E559 Vitamin D deficiency, unspecified: Secondary | ICD-10-CM | POA: Diagnosis not present

## 2019-11-07 HISTORY — DX: Essential (primary) hypertension: I10

## 2019-11-07 MED ORDER — LISINOPRIL 5 MG PO TABS: 5.0000 mg | ORAL_TABLET | Freq: Every day | ORAL | 0 refills | Status: DC

## 2019-11-07 MED ORDER — BLOOD GLUCOSE MONITOR KIT: PACK | 0 refills | Status: DC

## 2019-11-07 NOTE — Progress Notes (Signed)
Metrics: Intervention Frequency ACO  Documented Smoking Status Yearly  Screened one or more times in 24 months  Cessation Counseling or  Active cessation medication Past 24 months  Past 24 months   Guideline developer: UpToDate (See UpToDate for funding source) Date Released: 2014       Wellness Office Visit  Subjective:  Patient ID: Debra Tanner, female    DOB: [birth year]  Age: 38 y.o. MRN: [id number]  CC: This lady comes in for follow-up of diabetes, hypertension, vitamin D deficiency and anemia. HPI She tells me that he is she has a craving for ice again and in the past she has been anemic because of menorrhagia.  She does describe menorrhagia also. As far as her diabetes is concerned, she continues to take Metformin.  I am not sure how compliant she is with diet. She also had a history of vitamin D deficiency but she has not been taking vitamin D3 supplementation.  Past Medical History:  Diagnosis Date  . Anemia   . Depression   . Diabetes mellitus without complication ([place])   . Essential hypertension, benign 11/07/2019  . GAD (generalized anxiety disorder)   . Hypertension   . Trichimoniasis       Family History  Problem Relation Age of Onset  . Alcohol abuse Mother   . Cirrhosis Mother   . Alcohol abuse Father   . Heart disease Father   . Hypertension Father   . Diabetes Sister   . Sickle cell trait Sister   . Hypertension Maternal Grandmother   . Hyperlipidemia Maternal Grandmother   . Cancer Maternal Grandfather        lung  . Cancer Paternal Grandfather   . 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 Smoking status: Never Smoker  . Smokeless tobacco: Never Used  Substance Use Topics  . Alcohol use: Yes    Comment: occassionally    Current Meds    Medication Sig  . ACCU-CHEK AVIVA PLUS test strip USE UP TO FOUR TIMES DAILY AS DIRECTED  . blood glucose meter kit and supplies KIT Dispense based on patient and insurance preference. Use up to four times daily as directed. (FOR ICD-9 250.00, 250.01).  Elmore Guise Devices (ACCU-CHEK SOFTCLIX) lancets 1 each by Other route 2 (two) times daily. Use as instructed  . metFORMIN (GLUCOPHAGE) 500 MG tablet Take 1 tablet (500 mg total) by mouth 2 (two) times daily with a meal.  . metroNIDAZOLE (FLAGYL) 500 MG tablet Take 1 tablet (500 mg total) by mouth 2 (two) times daily.  . [DISCONTINUED] blood glucose meter kit and supplies KIT Dispense based on patient and insurance preference. Use up to four times daily as directed. (FOR ICD-9 250.00, 250.01).     Objective:   Today's Vitals: BP (!) 150/90   Pulse 72   Ht _0  (1.448 m)   Wt 161 lb 3.2 oz (73.1 kg)   LMP 10/17/2019   BMI 34.88 kg/m  Vitals with BMI 11/07/2019 11/01/2019 02/20/2019  Height _1  _2  _3   Weight 161 lbs 3 oz 164 lbs 157 lbs 14 oz  BMI 34.87 64.33 29.51  Systolic 884 166 063  Diastolic 90 78 83  Pulse 72 90 72     Physical Exam  She looks systemically well.  She remains obese and has lost about 3 pounds in weight.    Assessment   1. Diabetes mellitus without complication (H[facility])   2. Obesity (BMI 30-39.9)   3. Essential hypertension, benign   4. Vitamin D deficiency disease   5. Menorrhagia with regular cycle       Tests ordered Orders Placed This Encounter  Procedures  . CMP with eGFR([facility])  . Hemoglobin A1c  . Vitamin D, 25-hydroxy  . CBC  . Ferritin     Plan: 1. Blood work is ordered as above. 2. She will continue with medications for diabetes and we will see what the hemoglobin A1c shows. 3. I am concerned about her hypertension which is not controlled and in the past she was supposed to be taking lisinopril but for some reason has not been doing so.  I have sent a new prescription for  lisinopril 5 mg daily. 4. I will see her in about a month's time to see how she is doing and also hopefully her hypertension is better controlled. 5. Further recommendations will depend on blood results.  She will receive influenza vaccination today.   Meds ordered this encounter  Medications  . lisinopril (ZESTRIL) 5 MG tablet    Sig: Take 1 tablet (5 mg total) by mouth daily.    Dispense:  90 tablet    Refill:  0  . blood glucose meter kit and supplies KIT    Sig: Dispense based on patient and insurance preference. Use up to four times daily as directed. (FOR ICD-9 250.00, 250.01).    Dispense:  1 each    Refill:  0    Order Specific Question:   Number of strips    Answer:   100    Order Specific Question:   Number of lancets    Answer:   100    Jenasis Straley Luther Parody, MD

## 2019-11-07 NOTE — Patient Instructions (Signed)
[facility] Dietary Recommendations for Weight Loss What to Avoid . Avoid added sugars o Often added sugar can be found in processed foods such as many condiments, dry cereals, cakes, cookies, chips, crisps, crackers, candies, sweetened drinks, etc.  o Read labels and AVOID/DECREASE use of foods with the following in their ingredient list: Sugar, fructose, high fructose corn syrup, sucrose, glucose, maltose, dextrose, molasses, cane sugar, brown sugar, any type of syrup, agave nectar, etc.   . Avoid snacking in between meals . Avoid foods made with flour o If you are going to eat food made with flour, choose those made with whole-grains; and, minimize your consumption as much as is tolerable . Avoid processed foods o These foods are generally stocked in the middle of the grocery store. Focus on shopping on the perimeter of the grocery.  What to Include . Vegetables o GREEN LEAFY VEGETABLES: Kale, spinach, mustard greens, collard greens, cabbage, broccoli, etc. o OTHER: Asparagus, cauliflower, eggplant, carrots, peas, Brussel sprouts, tomatoes, bell peppers, zucchini, beets, cucumbers, etc. . Grains, seeds, and legumes o Beans: kidney beans, black eyed peas, garbanzo beans, black beans, pinto beans, etc. o Whole, unrefined grains: brown rice, barley, bulgur, oatmeal, etc. . Healthy fats  o Avoid highly processed fats such as vegetable oil o Examples of healthy fats: avocado, olives, virgin olive oil, dark chocolate (?72% Cocoa), nuts (peanuts, almonds, walnuts, cashews, pecans, etc.) . Low - Moderate Intake of Animal Sources of Protein o Meat sources: chicken, turkey, salmon, tuna. Limit to 4 ounces of meat at one time. o Consider limiting dairy sources, but when choosing dairy focus on: PLAIN Greek yogurt, cottage cheese, high-protein milk . Fruit o Choose berries  When to Eat . Intermittent Fasting: o Choosing not to eat for a specific time period, but DO FOCUS ON HYDRATION  when fasting o Multiple Techniques: - Time Restricted Eating: eat 3 meals in a day, each meal lasting no more than 60 minutes, no snacks between meals - 16-18 hour fast: fast for 16 to 18 hours up to 7 days a week. Often suggested to start with 2-3 nonconsecutive days per week.  . Remember the time you sleep is counted as fasting.  . Examples of eating schedule: Fast from 7:00pm-11:00am. Eat between 11:00am-7:00pm.  - 24-hour fast: fast for 24 hours up to every other day. Often suggested to start with 1 day per week . Remember the time you sleep is counted as fasting . Examples of eating schedule:  o Eating day: eat 2-3 meals on your eating day. If doing 2 meals, each meal should last no more than 90 minutes. If doing 3 meals, each meal should last no more than 60 minutes. Finish last meal by 7:00pm. o Fasting day: Fast until 7:00pm.  o IF YOU FEEL UNWELL FOR ANY REASON/IN ANY WAY WHEN FASTING, STOP FASTING BY EATING A NUTRITIOUS SNACK OR LIGHT MEAL o ALWAYS FOCUS ON HYDRATION DURING FASTS - Acceptable Hydration sources: water, broths, tea/coffee (black tea/coffee is best but using a small amount of whole-fat dairy products in coffee/tea is acceptable).  - Poor Hydration Sources: anything with sugar or artificial sweeteners added to it  These recommendations have been developed for patients that are actively receiving medical care from either Dr. Coraline Talwar or Sarah Gray, DNP, NP-C at [facility]. These recommendations are developed for patients with specific medical conditions and are not meant to be distributed or used by others that are not actively receiving care from either provider listed   above at [facility]. It is not appropriate to participate in the above eating plans without proper medical supervision.   Reference: Fung, J. The obesity code. [place]: [place]; 2016.   

## 2019-11-08 LAB — COMPLETE METABOLIC PANEL WITH GFR
AG Ratio: 1.4 (calc) (ref 1.0–2.5)
ALT: 21 U/L (ref 6–29)
AST: 21 U/L (ref 10–30)
Albumin: 4.1 g/dL (ref 3.6–5.1)
Alkaline phosphatase (APISO): 80 U/L (ref 31–125)
BUN: 8 mg/dL (ref 7–25)
CO2: 20 mmol/L (ref 20–32)
Calcium: 10.1 mg/dL (ref 8.6–10.2)
Chloride: 104 mmol/L (ref 98–110)
Creat: 0.69 mg/dL (ref 0.50–1.10)
GFR, Est African American: 128 mL/min/{1.73_m2} (ref >=60)
GFR, Est Non African American: 110 mL/min/{1.73_m2} (ref >=60)
Globulin: 3 g/dL (calc) (ref 1.9–3.7)
Glucose, Bld: 276 mg/dL — ABNORMAL HIGH (ref 65–99)
Potassium: 4.5 mmol/L (ref 3.5–5.3)
Sodium: 137 mmol/L (ref 135–146)
Total Bilirubin: 0.3 mg/dL (ref 0.2–1.2)
Total Protein: 7.1 g/dL (ref 6.1–8.1)

## 2019-11-08 LAB — CBC
HCT: 37.9 % (ref 35.0–45.0)
Hemoglobin: 11.8 g/dL (ref 11.7–15.5)
MCH: 26.8 pg — ABNORMAL LOW (ref 27.0–33.0)
MCHC: 31.1 g/dL — ABNORMAL LOW (ref 32.0–36.0)
MCV: 85.9 fL (ref 80.0–100.0)
MPV: 11.6 fL (ref 7.5–12.5)
Platelets: 379 10*3/uL (ref 140–400)
RBC: 4.41 10*6/uL (ref 3.80–5.10)
RDW: 13.3 % (ref 11.0–15.0)
WBC: 12 10*3/uL — ABNORMAL HIGH (ref 3.8–10.8)

## 2019-11-08 LAB — HEMOGLOBIN A1C
Hgb A1c MFr Bld: 8 % of total Hgb — ABNORMAL HIGH (ref <5.7)
Mean Plasma Glucose: 183 (calc)
eAG (mmol/L): 10.1 (calc)

## 2019-11-08 LAB — FERRITIN: Ferritin: 12 ng/mL — ABNORMAL LOW (ref 16–154)

## 2019-11-08 LAB — VITAMIN D 25 HYDROXY (VIT D DEFICIENCY, FRACTURES): Vit D, 25-Hydroxy: 19 ng/mL — ABNORMAL LOW (ref 30–100)

## 2019-11-30 ENCOUNTER — Ambulatory Visit (INDEPENDENT_AMBULATORY_CARE_PROVIDER_SITE_OTHER): Payer: Medicaid Other | Admitting: Internal Medicine

## 2019-11-30 ENCOUNTER — Encounter (INDEPENDENT_AMBULATORY_CARE_PROVIDER_SITE_OTHER): Payer: Self-pay | Admitting: Internal Medicine

## 2019-11-30 DIAGNOSIS — G4700 Insomnia, unspecified: Secondary | ICD-10-CM | POA: Diagnosis not present

## 2019-11-30 DIAGNOSIS — F411 Generalized anxiety disorder: Secondary | ICD-10-CM | POA: Diagnosis not present

## 2019-11-30 MED ORDER — ESCITALOPRAM OXALATE 5 MG PO TABS: 5.0000 mg | ORAL_TABLET | Freq: Every day | ORAL | 2 refills | Status: DC

## 2019-11-30 NOTE — Progress Notes (Signed)
Metrics: Intervention Frequency ACO  Documented Smoking Status Yearly  Screened one or more times in 24 months  Cessation Counseling or  Active cessation medication Past 24 months  Past 24 months   Guideline developer: UpToDate (See UpToDate for funding source) Date Released: 2014       Wellness Office Visit  Subjective:  Patient ID: Debra Tanner, female    DOB: 1981/09/17  Age: 39 y.o. MRN: [id number]  CC: This is an audio telemedicine visit with the patient's permission who is at home and I am in my office. I was able to easily recognize her voice.  Her main complaint is insomnia and anxiety. HPI  She does have a diagnosis of generalized anxiety disorder and she tells me that she previously was taking medicine but then apparently was discontinued.  She has been more stressful lately, relating to her grandmother and taking care of her.  She would like to get better sleep and reduce her anxiety. Past Medical History:  Diagnosis Date  . Anemia   . Depression   . Diabetes mellitus without complication ([place])   . Essential hypertension, benign 11/07/2019  . GAD (generalized anxiety disorder)   . Hypertension   . Trichimoniasis       Family History  Problem Relation Age of Onset  . Alcohol abuse Mother   . Cirrhosis Mother   . Alcohol abuse Father   . Heart disease Father   . Hypertension Father   . Diabetes Sister   . Sickle cell trait Sister   . Hypertension Maternal Grandmother   . Hyperlipidemia Maternal Grandmother   . Cancer Maternal Grandfather        lung  . Cancer Paternal Grandfather   . 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 Smoking status: Never Smoker  . Smokeless tobacco: Never Used  Substance Use Topics  . Alcohol use: Yes    Comment: occassionally     Current Meds  Medication Sig  . ACCU-CHEK AVIVA PLUS test strip USE UP TO FOUR TIMES DAILY AS DIRECTED  . blood glucose meter kit and supplies KIT Dispense based on patient and insurance preference. Use up to four times daily as directed. (FOR ICD-9 250.00, 250.01).  Elmore Guise Devices (ACCU-CHEK SOFTCLIX) lancets 1 each by Other route 2 (two) times daily. Use as instructed  . lisinopril (ZESTRIL) 5 MG tablet Take 1 tablet (5 mg total) by mouth daily.  . metFORMIN (GLUCOPHAGE) 500 MG tablet Take 1 tablet (500 mg total) by mouth 2 (two) times daily with a meal.  . metroNIDAZOLE (FLAGYL) 500 MG tablet Take 1 tablet (500 mg total) by mouth 2 (two) times daily.       Objective:   Today's Vitals: There were no vitals taken for this visit. Vitals with BMI 11/07/2019 11/01/2019 02/20/2019  Height 4' 9"  4' 9"  4' 11"   Weight 161 lbs 3 oz 164 lbs 157 lbs 14 oz  BMI 34.87 22.63 33.54  Systolic 562 563 893  Diastolic 90 78 83  Pulse 72 90 72     Physical Exam  Her speech appeared normal on the phone and she appeared to be alert and orientated.     Assessment   1. Insomnia, unspecified type   2. GAD (generalized anxiety disorder)  Tests ordered No orders of the defined types were placed in this encounter.    Plan: 1. I am going to try her on Lexapro 5 mg daily to see if this will help her generalized anxiety disorder and in turn may well help her sleep better.  I told her of possible side effects. 2. I will see her in a couple of weeks time when she is scheduled to come back for follow-up and we will see how she is doing then. 3. This phone call lasted 5 minutes.   Meds ordered this encounter  Medications  . escitalopram (LEXAPRO) 5 MG tablet    Sig: Take 1 tablet (5 mg total) by mouth daily.    Dispense:  30 tablet    Refill:  2    Jabron Weese Luther Parody, MD

## 2019-12-07 ENCOUNTER — Other Ambulatory Visit: Payer: Medicaid Other | Admitting: Adult Health

## 2019-12-11 ENCOUNTER — Encounter (INDEPENDENT_AMBULATORY_CARE_PROVIDER_SITE_OTHER): Payer: Self-pay | Admitting: Nurse Practitioner

## 2019-12-11 ENCOUNTER — Telehealth (INDEPENDENT_AMBULATORY_CARE_PROVIDER_SITE_OTHER): Payer: Self-pay | Admitting: Internal Medicine

## 2019-12-11 ENCOUNTER — Ambulatory Visit (INDEPENDENT_AMBULATORY_CARE_PROVIDER_SITE_OTHER): Payer: Medicaid Other | Admitting: Nurse Practitioner

## 2019-12-11 VITALS — BP 156/102 | HR 81 | Temp 98.1°F

## 2019-12-11 DIAGNOSIS — F411 Generalized anxiety disorder: Secondary | ICD-10-CM | POA: Diagnosis not present

## 2019-12-11 DIAGNOSIS — I10 Essential (primary) hypertension: Secondary | ICD-10-CM | POA: Diagnosis not present

## 2019-12-11 MED ORDER — SERTRALINE HCL 50 MG PO TABS: 25.0000 mg | ORAL_TABLET | Freq: Every day | ORAL | 3 refills | Status: DC

## 2019-12-11 MED ORDER — LISINOPRIL 10 MG PO TABS: 10.0000 mg | ORAL_TABLET | Freq: Every day | ORAL | 0 refills | Status: DC

## 2019-12-11 MED ORDER — HYDROXYZINE HCL 25 MG PO TABS: 25.0000 mg | ORAL_TABLET | Freq: Four times a day (QID) | ORAL | 0 refills | Status: DC | PRN

## 2019-12-11 NOTE — Telephone Encounter (Signed)
Please let patient know that I do not want her taking Xanax on a regular basis so she can discontinue the Lexapro for the time being and schedule a virtual visit with Judson Roch this week to discuss what other options are available.

## 2019-12-11 NOTE — Patient Instructions (Signed)
Thank you for choosing [facility] as your medical provider! If you have any questions or concerns regarding your health care, please do not hesitate to call our office.  We made 3 medication changes today: 1.  Zoloft: Take 1/2 tablet by mouth daily.  This is a antidepressant but is also considered first-line treatment for generalized anxiety.  This can take up to 8 weeks to really have an effect.  If you start to experience any suicidal thoughts please stop this medication and call our office.  2.  Hydroxyzine: Take 1 tablet by mouth every 6 hours only as needed for anxiety.  This medication can make you drowsy, thus the first time you take this medication make sure it is at a time when you are able to stay at home to see how it makes you feel.  Avoid any driving, operating heavy machinery, or doing anything that requires your full attention and ability to be alert when taking this medication.  3.  Lisinopril: We increased your lisinopril from 5 mg daily to 10 mg daily.  This means you can finish your current bottle of lisinopril by taking 2 tablets a day.  When you pick up the new bottle, you will take 1 tablet/day.  If this is because the new bottle will have higher strength lisinopril tablets.  4.  Iron: Please get supplemental iron over-the-counter.  Take this daily to replete your iron stores.  Please follow-up as scheduled in 1 month. We look forward to seeing you again soon!  At [facility] we value your feedback. You may receive a survey about your visit today. Please share your experience as we strive to create trusting relationships with our patients to provide genuine, compassionate, quality care.  We appreciate your understanding and patience as we review any laboratory studies, imaging, and other diagnostic tests that are ordered as we care for you. We do our best to address any and all results in a timely manner. If you do not hear about test results within 1 week,  please do not hesitate to contact us. If we referred you to a specialist during your visit or ordered imaging testing, contact the office if you have not been contacted to be scheduled within 1 weeks.  We also encourage the use of MyChart, which contains your medical information for your review as well. If you are not enrolled in this feature, an access code is on this after visit summary for your convenience. Thank you for allowing [place] to be involved in your care.

## 2019-12-11 NOTE — Progress Notes (Signed)
Due to national recommendations of social distancing related to the [place] pandemic, an audio/visual tele-health visit was felt to be the most appropriate encounter type for this patient today. I connected with  Debra Tanner on 12/11/19 utilizing audio-only technology and verified that I am speaking with the correct person using two identifiers. The patient was located at their home, and I was located at the office of [facility] during the encounter. I discussed the limitations of evaluation and management by telemedicine. The patient expressed understanding and agreed to proceed.  The office visit was conducted using audio only technology because the patient did not have access to her cell phone which would allow her to use video technology.   Subjective:  Patient ID: Debra Tanner, female    DOB: Sep 14, 1981  Age: 39 y.o. MRN: [id number]  CC:  Chief Complaint  Patient presents with  . Anxiety      HPI  This patient presents today for a virtual office visit regarding the above.  She has been diagnosed with generalized anxiety disorder in the past.  She has recently been having a difficult time sleeping and feels that her anxiety is a bit worse, she also feels she has a mildly depressed mood.  She denies any suicidal ideation.  She was started on Lexapro approximately 10 days ago.  She tells me that she started to experience diarrhea on this medication.  She tells me she has been on Xanax in the past and is wondering if she can have this prescribed again.  Past Medical History:  Diagnosis Date  . Anemia   . Depression   . Diabetes mellitus without complication ([place])   . Essential hypertension, benign 11/07/2019  . GAD (generalized anxiety disorder)   . Hypertension   . Trichimoniasis       Family History  Problem Relation Age of Onset  . Alcohol abuse Mother   . Cirrhosis Mother   . Alcohol abuse Father   . Heart disease Father   . Hypertension Father   .  Diabetes Sister   . Sickle cell trait Sister   . Hypertension Maternal Grandmother   . Hyperlipidemia Maternal Grandmother   . Cancer Maternal Grandfather        lung  . Cancer Paternal Grandfather   . 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 Smoking status: Never Smoker  . Smokeless tobacco: Never Used  Substance Use Topics  . Alcohol use: Yes    Comment: occassionally     Current Meds  Medication Sig  . lisinopril (ZESTRIL) 5 MG tablet Take 1 tablet (5 mg total) by mouth daily.  . metFORMIN (GLUCOPHAGE) 500 MG tablet Take 1 tablet (500 mg total) by mouth 2 (two) times daily with a meal.    ROS:  Review of Systems  Constitutional: Positive for malaise/fatigue.  Eyes: Positive for blurred vision (intermittently - patient believes this is due to breaking her glasses).  Cardiovascular: Positive for palpitations. Negative for chest pain.  Neurological: Negative for dizziness, weakness and headaches.  Psychiatric/Behavioral: Positive for depression. Negative for suicidal ideas. The patient is nervous/anxious and has insomnia.      Objective:   Today's Vitals: There were no vitals taken for this visit. Vitals with BMI 11/30/2019 11/07/2019  11/01/2019  Height (No Data) 4\' 9"  4\' 9"   Weight (No Data) 161 lbs 3 oz 164 lbs  BMI - 123XX123 123XX123  Systolic (No Data) [id number]  Diastolic (No Data) 90 78  Pulse - 72 90     Physical Exam Physical exam not conducted today as office visit was conducted remotely.  Patient did sound well over the phone.  She answered questions appropriately, she was alert and oriented x3.  She appeared to have appropriate judgment.      Assessment   No diagnosis found.    Tests ordered No orders of the defined types were placed in this  encounter.    Plan: Please see assessment and plan per problem list below.  Of note, she also asked if she should be taking her iron pill.  Per chart review I see that her ferritin levels were low when her blood was taken recently.  Dr. Anastasio Champion did recommend that she restart her iron supplementation, and I reminded her of this as well.  She tells me she understands.   No orders of the defined types were placed in this encounter.   Patient to follow-up in 1 month for blood pressure check, metabolic panel recheck (due to change in lisinopril dose), and to see how her mood is with initiating Zoloft..  I spent 29 minutes dedicated to the care of this patient on the date of this encounter which includes a combination of either face-to-face or virtual contact with the patient and medication management.  Ailene Ards, NP

## 2019-12-11 NOTE — Assessment & Plan Note (Signed)
We discussed several options regarding the treatment of this.  I did tell her that first-line treatment is a SSRI, and we did discuss benzodiazepines and long-term risks associated with this medication.  I did tell her that I am not willing to prescribe her a benzodiazepine for chronic use.  Based on shared decision making we decided that she will try a low-dose of Zoloft, and if she tolerates this medication may consider titrating it up at her next office visit for optimal symptom relief.  She would also like to try hydroxyzine that she can take as needed for anxiety.  May need to consider switching from SSRI to SNRI if she does not tolerate the Zoloft, or referral to psychiatry.  She was told that if she has any suicidal ideation that she needs to stop the medication and call us.

## 2019-12-11 NOTE — Assessment & Plan Note (Signed)
Blood pressure was above goal.  Per chart review I see at her last office visit her blood pressure was elevated at that time as well.  I recommended that she increase her lisinopril from 5 mg daily to 10 mg daily.  She tells me she will do this.  She will need metabolic panel checked at her next office visit for evaluation.

## 2019-12-13 ENCOUNTER — Ambulatory Visit (INDEPENDENT_AMBULATORY_CARE_PROVIDER_SITE_OTHER): Payer: Medicaid Other | Admitting: Internal Medicine

## 2020-01-01 ENCOUNTER — Other Ambulatory Visit (INDEPENDENT_AMBULATORY_CARE_PROVIDER_SITE_OTHER): Payer: Self-pay | Admitting: Internal Medicine

## 2020-01-03 ENCOUNTER — Other Ambulatory Visit: Payer: Self-pay

## 2020-01-03 ENCOUNTER — Ambulatory Visit (INDEPENDENT_AMBULATORY_CARE_PROVIDER_SITE_OTHER): Payer: Medicaid Other | Admitting: Internal Medicine

## 2020-01-03 ENCOUNTER — Encounter (INDEPENDENT_AMBULATORY_CARE_PROVIDER_SITE_OTHER): Payer: Self-pay | Admitting: Internal Medicine

## 2020-01-03 VITALS — BP 142/77 | HR 95 | Temp 97.2°F | Resp 18 | Ht 60.0 in | Wt 153.0 lb

## 2020-01-03 DIAGNOSIS — G4700 Insomnia, unspecified: Secondary | ICD-10-CM

## 2020-01-03 DIAGNOSIS — E559 Vitamin D deficiency, unspecified: Secondary | ICD-10-CM

## 2020-01-03 DIAGNOSIS — I10 Essential (primary) hypertension: Secondary | ICD-10-CM

## 2020-01-03 DIAGNOSIS — F411 Generalized anxiety disorder: Secondary | ICD-10-CM | POA: Diagnosis not present

## 2020-01-03 NOTE — Progress Notes (Signed)
Metrics: Intervention Frequency ACO  Documented Smoking Status Yearly  Screened one or more times in 24 months  Cessation Counseling or  Active cessation medication Past 24 months  Past 24 months   Guideline developer: UpToDate (See UpToDate for funding source) Date Released: 2014       Wellness Office Visit  Subjective:  Patient ID: Debra Tanner, female    DOB: 03-14-1981  Age: 39 y.o. MRN: [id number]  CC: This lady comes in for follow-up of hypertension, anxiety, obesity. HPI  She has tolerated the higher dose of lisinopril 5 mg twice a day and she has a new prescription for 10 mg tablet to be taken once a day. She has also been working hard with nutrition and has managed to lose weight. She feels that the hydroxyzine that was prescribed seems to help her anxiety and she does not feel that she requires the Zoloft.  Overall, she does seem to be better. Unfortunately, she is not taking vitamin D3 supplementation as recommended. She is also not taking iron supplementation. Past Medical History:  Diagnosis Date  . Anemia   . Depression   . Diabetes mellitus without complication ([place])   . Essential hypertension, benign 11/07/2019  . GAD (generalized anxiety disorder)   . Hypertension   . Trichimoniasis       Family History  Problem Relation Age of Onset  . Alcohol abuse Mother   . Cirrhosis Mother   . Alcohol abuse Father   . Heart disease Father   . Hypertension Father   . Diabetes Sister   . Sickle cell trait Sister   . Hypertension Maternal Grandmother   . Hyperlipidemia Maternal Grandmother   . Cancer Maternal Grandfather        lung  . Cancer Paternal Grandfather   . 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 Smoking status: Never Smoker  . Smokeless tobacco: Never  Used  Substance Use Topics  . Alcohol use: Yes    Comment: occassionally    Current Meds  Medication Sig  . ACCU-CHEK AVIVA PLUS test strip USE UP TO FOUR TIMES DAILY AS DIRECTED  . blood glucose meter kit and supplies KIT Dispense based on patient and insurance preference. Use up to four times daily as directed. (FOR ICD-9 250.00, 250.01).  . ferrous sulfate 325 (65 FE) MG EC tablet Take 325 mg by mouth daily.  . hydrOXYzine (ATARAX/VISTARIL) 25 MG tablet Take 1 tablet (25 mg total) by mouth every 6 (six) hours as needed for anxiety.  [employer] (ACCU-CHEK SOFTCLIX) lancets 1 each by Other route 2 (two) times daily. Use as instructed  . lisinopril (ZESTRIL) 10 MG tablet Take 1 tablet (10 mg total) by mouth daily.  . metFORMIN (GLUCOPHAGE) 500 MG tablet TAKE 1 TABLET BY MOUTH TWICE DAILY  . sertraline (ZOLOFT) 50 MG tablet Take 0.5 tablets (25 mg total) by mouth daily.      Objective:   Today's Vitals: BP (!) 142/77 (BP Location: Right Arm, Patient Position: Sitting, Cuff Size: Normal)   Pulse 95   Temp (!) 97.2 F (36.2 C) (Temporal)   Resp 18   Ht 5' (1.524 m)   Wt 153 lb (69.4 kg)   SpO2 96% Comment: wearing mask.  BMI 29.88 kg/m  Vitals with BMI 01/03/2020 12/11/2019 12/11/2019  Height 5' 0"  - -  Weight 153 lbs - -  BMI 91.63 - -  Systolic 846 659 935  Diastolic 77 701 779  Pulse 95 81 85     Physical Exam  Her blood pressure is much better.  She has lost weight.  Her overall demeanor is improved.     Assessment   1. Essential hypertension, benign   2. Vitamin D deficiency disease   3. Insomnia, unspecified type   4. GAD (generalized anxiety disorder)       Tests ordered Orders Placed This Encounter  Procedures  . COMPLETE METABOLIC PANEL WITH GFR     Plan: 1. We will check electrolytes today to make sure she is tolerating lisinopril. 2. I have encouraged her strongly to take vitamin D3 10,000 units daily. 3. I have encouraged her to continue  to take iron supplementation. 4. I will see her in about 2 months time to see how she is doing and we will check all the blood work then including her A1c for her diabetes.   No orders of the defined types were placed in this encounter.   Doree Albee, MD

## 2020-01-04 LAB — COMPLETE METABOLIC PANEL WITH GFR
AG Ratio: 1.5 (calc) (ref 1.0–2.5)
ALT: 18 U/L (ref 6–29)
AST: 18 U/L (ref 10–30)
Albumin: 4.1 g/dL (ref 3.6–5.1)
Alkaline phosphatase (APISO): 84 U/L (ref 31–125)
BUN: 9 mg/dL (ref 7–25)
CO2: 22 mmol/L (ref 20–32)
Calcium: 9.4 mg/dL (ref 8.6–10.2)
Chloride: 104 mmol/L (ref 98–110)
Creat: 0.71 mg/dL (ref 0.50–1.10)
GFR, Est African American: 125 mL/min/{1.73_m2} (ref >=60)
GFR, Est Non African American: 108 mL/min/{1.73_m2} (ref >=60)
Globulin: 2.7 g/dL (calc) (ref 1.9–3.7)
Glucose, Bld: 281 mg/dL — ABNORMAL HIGH (ref 65–99)
Potassium: 4 mmol/L (ref 3.5–5.3)
Sodium: 137 mmol/L (ref 135–146)
Total Bilirubin: 0.4 mg/dL (ref 0.2–1.2)
Total Protein: 6.8 g/dL (ref 6.1–8.1)

## 2020-01-25 ENCOUNTER — Encounter: Payer: Self-pay | Admitting: Adult Health

## 2020-01-25 ENCOUNTER — Ambulatory Visit (INDEPENDENT_AMBULATORY_CARE_PROVIDER_SITE_OTHER): Payer: Medicaid Other | Admitting: Adult Health

## 2020-01-25 ENCOUNTER — Other Ambulatory Visit (HOSPITAL_COMMUNITY)
Admission: RE | Admit: 2020-01-25 | Discharge: 2020-01-25 | Disposition: A | Discharge status: Home / Self Care | Payer: Medicaid Other | Source: Ambulatory Visit | Attending: Adult Health | Admitting: Adult Health

## 2020-01-25 ENCOUNTER — Other Ambulatory Visit: Payer: Self-pay

## 2020-01-25 VITALS — BP 126/89 | HR 83 | Ht <= 58 in | Wt 151.0 lb

## 2020-01-25 DIAGNOSIS — Z01419 Encounter for gynecological examination (general) (routine) without abnormal findings: Secondary | ICD-10-CM | POA: Diagnosis not present

## 2020-01-25 DIAGNOSIS — Z0000 Encounter for general adult medical examination without abnormal findings: Secondary | ICD-10-CM | POA: Diagnosis not present

## 2020-01-25 DIAGNOSIS — N898 Other specified noninflammatory disorders of vagina: Secondary | ICD-10-CM | POA: Insufficient documentation

## 2020-01-25 MED ORDER — FLUCONAZOLE 150 MG PO TABS: ORAL_TABLET | ORAL | 1 refills | Status: DC

## 2020-01-25 NOTE — Progress Notes (Addendum)
Patient ID: Debra Tanner, female   DOB: Jan 25, 1981, 39 y.o.   MRN: [id number] History of Present Illness: Debra Tanner is a 39 year old black female, single, G2P1011, in for a well woman gyn exam and pap. PCP is Dr Anastasio Champion   Current Medications, Allergies, Past Medical History, Past Surgical History, Family History and Social History were reviewed in [place] record.     Review of Systems: Patient denies any headaches, hearing loss, fatigue, blurred vision, shortness of breath, chest pain, abdominal pain, problems with bowel movements, urination, or intercourse(not active). No joint pain or mood swings. +vaginal itching     Physical Exam:BP 126/89 (BP Location: Left Arm, Patient Position: Sitting, Cuff Size: Normal)   Pulse 83   Ht 4\' 9"  (1.448 m)   Wt 151 lb (68.5 kg)   LMP 01/02/2020   BMI 32.68 kg/m  General:  Well developed, well nourished, no acute distress Skin:  Warm and dry Neck:  Midline trachea, normal thyroid, good ROM, no lymphadenopathy Lungs; Clear to auscultation bilaterally Breast:  No dominant palpable mass, retraction, or nipple discharge Cardiovascular: Regular rate and rhythm Abdomen:  Soft, non tender, no hepatosplenomegaly Pelvic:  External genitalia is normal in appearance, no lesions.  The vagina is normal in appearance,has white discharge, no odor. Urethra has no lesions or masses. The cervix is bulbous. Pap with GC/CHL and high risk HPV 16/18 genotyping performed. Uterus is felt to be normal size, shape, and contour.  No adnexal masses or tenderness noted.Bladder is non tender, no masses felt. Extremities/musculoskeletal:  No swelling or varicosities noted, no clubbing or cyanosis Psych:  No mood changes, alert and cooperative,seems happy Fall risk is low PHQ 2 score is 0 Examination chaperoned by Estill Bamberg Rash LPN  Impression and Plan: 1. Encounter for gynecological examination with Papanicolaou smear of cervix Pap sent  Physical  in 1 year Pap in 3 if normal Mammogram at 40 Labs with PCP   2. Vaginal itching Discussed getting sugars under control And no pot Meds ordered this encounter  Medications  . fluconazole (DIFLUCAN) 150 MG tablet    Sig: Take 1 now and can repeat 1 in 3 days if needed    Dispense:  2 tablet    Refill:  1    Order Specific Question:   Supervising Provider    Answer:   Florian Buff [[id number]]   Use condoms

## 2020-01-30 LAB — CYTOLOGY - PAP
Adequacy: ABSENT
Chlamydia: NEGATIVE
Comment: NEGATIVE
Comment: NEGATIVE
Comment: NORMAL
Diagnosis: NEGATIVE
High risk HPV: NEGATIVE
Neisseria Gonorrhea: NEGATIVE

## 2020-02-12 ENCOUNTER — Other Ambulatory Visit (INDEPENDENT_AMBULATORY_CARE_PROVIDER_SITE_OTHER): Payer: Self-pay | Admitting: Nurse Practitioner

## 2020-02-12 DIAGNOSIS — I10 Essential (primary) hypertension: Secondary | ICD-10-CM

## 2020-03-11 ENCOUNTER — Ambulatory Visit (INDEPENDENT_AMBULATORY_CARE_PROVIDER_SITE_OTHER): Payer: Medicaid Other | Admitting: Internal Medicine

## 2020-03-14 ENCOUNTER — Ambulatory Visit (INDEPENDENT_AMBULATORY_CARE_PROVIDER_SITE_OTHER): Payer: Medicaid Other | Admitting: Internal Medicine

## 2020-03-14 ENCOUNTER — Encounter (INDEPENDENT_AMBULATORY_CARE_PROVIDER_SITE_OTHER): Payer: Self-pay | Admitting: Internal Medicine

## 2020-03-14 ENCOUNTER — Other Ambulatory Visit: Payer: Self-pay

## 2020-03-14 VITALS — BP 120/85 | HR 83 | Temp 97.5°F | Ht 58.8 in | Wt 148.6 lb

## 2020-03-14 DIAGNOSIS — E119 Type 2 diabetes mellitus without complications: Secondary | ICD-10-CM | POA: Diagnosis not present

## 2020-03-14 DIAGNOSIS — I10 Essential (primary) hypertension: Secondary | ICD-10-CM

## 2020-03-14 DIAGNOSIS — Z726 Gambling and betting: Secondary | ICD-10-CM

## 2020-03-14 DIAGNOSIS — E559 Vitamin D deficiency, unspecified: Secondary | ICD-10-CM | POA: Diagnosis not present

## 2020-03-14 DIAGNOSIS — E669 Obesity, unspecified: Secondary | ICD-10-CM

## 2020-03-14 NOTE — Progress Notes (Signed)
Metrics: Intervention Frequency ACO  Documented Smoking Status Yearly  Screened one or more times in 24 months  Cessation Counseling or  Active cessation medication Past 24 months  Past 24 months   Guideline developer: UpToDate (See UpToDate for funding source) Date Released: 2014       Wellness Office Visit  Subjective:  Patient ID: Debra Tanner, female    DOB: Oct 04, 1981  Age: 39 y.o. MRN: [id number]  CC: This lady comes in for follow-up of diabetes, obesity, hypertension. HPI  She is stressed because she has been gambling with slot machines and has been losing money which is causing more stress. She says she is trying to keep to some kind of diet for her diabetes. She is compliant with Metformin. She is compliant with lisinopril for hypertension.  She denies any chest pain, dyspnea, palpitations or limb weakness. Past Medical History:  Diagnosis Date  . Anemia   . Depression   . Diabetes mellitus without complication ([place])   . Essential hypertension, benign 11/07/2019  . GAD (generalized anxiety disorder)   . Hypertension   . Trichimoniasis       Family History  Problem Relation Age of Onset  . Alcohol abuse Mother   . Cirrhosis Mother   . Alcohol abuse Father   . Heart disease Father   . Hypertension Father   . Diabetes Sister   . Sickle cell trait Sister   . Hypertension Maternal Grandmother   . Hyperlipidemia Maternal Grandmother   . Cancer Maternal Grandfather        lung  . Cancer Paternal Grandfather   . 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 Smoking status: Never Smoker  . Smokeless tobacco: Never Used  Substance Use Topics  . Alcohol use: Yes    Comment: occassionally    Current Meds  Medication Sig  . ACCU-CHEK AVIVA PLUS test strip USE UP TO  FOUR TIMES DAILY AS DIRECTED  . blood glucose meter kit and supplies KIT Dispense based on patient and insurance preference. Use up to four times daily as directed. (FOR ICD-9 250.00, 250.01).  [employer] (ACCU-CHEK SOFTCLIX) lancets 1 each by Other route 2 (two) times daily. Use as instructed  . lisinopril (ZESTRIL) 10 MG tablet TAKE 1 TABLET(10 MG) BY MOUTH DAILY  . metFORMIN (GLUCOPHAGE) 500 MG tablet TAKE 1 TABLET BY MOUTH TWICE DAILY  . [DISCONTINUED] sertraline (ZOLOFT) 50 MG tablet Take 0.5 tablets (25 mg total) by mouth daily.      Objective:   Today's Vitals: BP 120/85 (BP Location: Left Arm, Patient Position: Sitting, Cuff Size: Normal)   Pulse 83   Temp (!) 97.5 F (36.4 C) (Temporal)   Ht 4' 10.8" (1.494 m)   Wt 148 lb 9.6 oz (67.4 kg)   SpO2 99%   BMI 30.22 kg/m  Vitals with BMI 03/14/2020 01/25/2020 01/03/2020  Height 4' 10.8" 4' 9"  5' 0"   Weight 148 lbs 10 oz 151 lbs 153 lbs  BMI 30.2 01.00 71.21  Systolic 975 883 254  Diastolic 85 89 77  Pulse 83 83 95     Physical Exam  She looks systemically well.  She has lost about 3 pounds since her last visit.  Blood pressure is reasonable.  She is alert and  orientated without any focal neurological signs.     Assessment   1. Essential hypertension, benign   2. Diabetes mellitus without complication ([facility])   3. Obesity (BMI 30-39.9)   4. Vitamin D deficiency disease   5. Gambling       Tests ordered Orders Placed This Encounter  Procedures  . COMPLETE METABOLIC PANEL WITH GFR  . Hemoglobin A1c  . VITAMIN D 25 Hydroxy (Vit-D Deficiency, Fractures)  . Lipid panel     Plan: 1. Blood work is ordered. 2. She will continue with lisinopril for hypertension which seems to be controlling her blood pressure reasonably well. 3. She will continue with Metformin for diabetes and we will see what the A1c is. 4. She will continue to focus on nutrition.  I did discuss with her at the dangers of addiction to  gambling. 5. Follow-up in 3 months with Judson Roch and further recommendations will depend on blood results.   No orders of the defined types were placed in this encounter.   Doree Albee, MD

## 2020-03-15 ENCOUNTER — Ambulatory Visit: Payer: Medicaid Other

## 2020-03-15 LAB — COMPLETE METABOLIC PANEL WITH GFR
AG Ratio: 1.2 (calc) (ref 1.0–2.5)
ALT: 11 U/L (ref 6–29)
AST: 12 U/L (ref 10–30)
Albumin: 3.9 g/dL (ref 3.6–5.1)
Alkaline phosphatase (APISO): 75 U/L (ref 31–125)
BUN: 8 mg/dL (ref 7–25)
CO2: 22 mmol/L (ref 20–32)
Calcium: 9.8 mg/dL (ref 8.6–10.2)
Chloride: 103 mmol/L (ref 98–110)
Creat: 0.7 mg/dL (ref 0.50–1.10)
GFR, Est African American: 127 mL/min/{1.73_m2} (ref >=60)
GFR, Est Non African American: 110 mL/min/{1.73_m2} (ref >=60)
Globulin: 3.2 g/dL (calc) (ref 1.9–3.7)
Glucose, Bld: 338 mg/dL — ABNORMAL HIGH (ref 65–99)
Potassium: 5.3 mmol/L (ref 3.5–5.3)
Sodium: 133 mmol/L — ABNORMAL LOW (ref 135–146)
Total Bilirubin: 0.5 mg/dL (ref 0.2–1.2)
Total Protein: 7.1 g/dL (ref 6.1–8.1)

## 2020-03-15 LAB — LIPID PANEL
Cholesterol: 113 mg/dL (ref <200)
HDL: 34 mg/dL — ABNORMAL LOW (ref >=50)
LDL Cholesterol (Calc): 55 mg/dL (calc)
Non-HDL Cholesterol (Calc): 79 mg/dL (calc) (ref <130)
Total CHOL/HDL Ratio: 3.3 (calc) (ref <5.0)
Triglycerides: 154 mg/dL — ABNORMAL HIGH (ref <150)

## 2020-03-15 LAB — HEMOGLOBIN A1C
Hgb A1c MFr Bld: 9.8 % of total Hgb — ABNORMAL HIGH (ref <5.7)
Mean Plasma Glucose: 235 (calc)
eAG (mmol/L): 13 (calc)

## 2020-03-15 LAB — VITAMIN D 25 HYDROXY (VIT D DEFICIENCY, FRACTURES): Vit D, 25-Hydroxy: 17 ng/mL — ABNORMAL LOW (ref 30–100)

## 2020-03-18 ENCOUNTER — Telehealth (INDEPENDENT_AMBULATORY_CARE_PROVIDER_SITE_OTHER): Payer: Self-pay

## 2020-03-18 ENCOUNTER — Other Ambulatory Visit (INDEPENDENT_AMBULATORY_CARE_PROVIDER_SITE_OTHER): Payer: Self-pay | Admitting: Internal Medicine

## 2020-03-18 MED ORDER — JARDIANCE 10 MG PO TABS: 10.0000 mg | ORAL_TABLET | Freq: Every day | ORAL | 3 refills | Status: DC

## 2020-03-18 NOTE — Telephone Encounter (Signed)
Routing to Dr. Anastasio Champion

## 2020-03-18 NOTE — Telephone Encounter (Signed)
Tried to reach her left voicemail to return my call

## 2020-03-18 NOTE — Progress Notes (Signed)
Please call the patient back and tell her that her diabetes is getting worse.  In addition to the Metformin, I have sent a new prescription for Jardiance 10 mg tablets, take 1 daily.  Also, continue to work on nutrition as we have discussed many times before.Follow-up as scheduled.

## 2020-03-18 NOTE — Telephone Encounter (Signed)
Test results Received: Today Message Contents  Debra Tanner, [name] and wants to get her test results, can you call her back when you get a chance.

## 2020-03-18 NOTE — Telephone Encounter (Signed)
Can you please call this patient back on her results as I indicated on her blood work?  Thanks.

## 2020-03-19 NOTE — Progress Notes (Signed)
Pt look up side effects is got her worried. So she is afraid. She said she know she was on a unhealthy diet. So if she stop eating the junk and do better nutrition, or is this something different to take? She does not want no meds.

## 2020-03-19 NOTE — Progress Notes (Signed)
Please call the patient back.  If she does not want to take the new medication, Jardiance, that is her choice but I would definitely recommend it at this point because her diabetes is terrible and we need to definitely do something to get it better.  Of course, she needs to focus on her nutrition also.

## 2020-03-19 NOTE — Telephone Encounter (Signed)
Debra Tanner is aware of the results & recommendation

## 2020-03-19 NOTE — Progress Notes (Signed)
She said she would need a new machine? She will need a new kit.

## 2020-03-20 ENCOUNTER — Other Ambulatory Visit (INDEPENDENT_AMBULATORY_CARE_PROVIDER_SITE_OTHER): Payer: Self-pay | Admitting: Internal Medicine

## 2020-03-20 DIAGNOSIS — E119 Type 2 diabetes mellitus without complications: Secondary | ICD-10-CM

## 2020-04-02 ENCOUNTER — Ambulatory Visit (INDEPENDENT_AMBULATORY_CARE_PROVIDER_SITE_OTHER): Payer: Medicaid Other | Admitting: Adult Health

## 2020-04-02 ENCOUNTER — Other Ambulatory Visit: Payer: Self-pay

## 2020-04-02 ENCOUNTER — Other Ambulatory Visit (INDEPENDENT_AMBULATORY_CARE_PROVIDER_SITE_OTHER): Payer: Self-pay | Admitting: Internal Medicine

## 2020-04-02 ENCOUNTER — Encounter: Payer: Self-pay | Admitting: Adult Health

## 2020-04-02 VITALS — BP 146/95 | HR 71 | Ht <= 58 in | Wt 146.0 lb

## 2020-04-02 DIAGNOSIS — B379 Candidiasis, unspecified: Secondary | ICD-10-CM | POA: Insufficient documentation

## 2020-04-02 DIAGNOSIS — N898 Other specified noninflammatory disorders of vagina: Secondary | ICD-10-CM | POA: Diagnosis not present

## 2020-04-02 LAB — POCT WET PREP (WET MOUNT)
Clue Cells Wet Prep Whiff POC: NEGATIVE
WBC, Wet Prep HPF POC: POSITIVE

## 2020-04-02 MED ORDER — FLUCONAZOLE 150 MG PO TABS
ORAL_TABLET | ORAL | 1 refills | Status: DC
Start: 2020-04-02 — End: 2020-06-24

## 2020-04-02 NOTE — Progress Notes (Signed)
  Subjective:     Patient ID: Debra Tanner, female   DOB: [birth year], 39 y.o.   MRN: [id number]  HPI Debra Tanner is a 39 year old black female, G2P1011 in complaining of vaginal itching and burning, PCP is Dr Anastasio Champion   Review of Systems +vaginal itching and burning No new partners Blood sugar has been elevated  Reviewed past medical,surgical, social and family history. Reviewed medications and allergies.     Objective:   Physical Exam BP (!) 146/95 (BP Location: Left Arm, Patient Position: Sitting, Cuff Size: Normal)   Pulse 71   Ht 4\' 9"  (1.448 m)   Wt 146 lb (66.2 kg)   LMP 03/20/2020 (Approximate)   BMI 31.59 kg/m fall risk is low Skin warm and dry.Pelvic: external genitalia is normal in appearance, has redness in creases of labia, vagina: white discharge without odor,urethra has no lesions or masses noted, cervix:smooth and bulbous, uterus: normal size, shape and contour, non tender, no masses felt, adnexa: no masses or tenderness noted. Bladder is non tender and no masses felt.Painted labia and vagina with gentian violet   Wet prep: + yeast and WBCs Examination chaperoned by Levy Pupa LPN    Assessment:     1. Vaginal itching  2. Vaginal irritation  3. Vaginal discharge  4. Yeast infection Painted with gentian violet Will rx diflucan Meds ordered this encounter  Medications  . fluconazole (DIFLUCAN) 150 MG tablet    Sig: Take 1 now and repeat 1 in 3 days    Dispense:  2 tablet    Refill:  1    Order Specific Question:   Supervising Provider    Answer:   Florian Buff [[id number]]      Plan:    Pat dry,do not wash with soap today  Get blood sugars under better control  Follow up prn

## 2020-06-24 ENCOUNTER — Encounter (INDEPENDENT_AMBULATORY_CARE_PROVIDER_SITE_OTHER): Payer: Self-pay | Admitting: Nurse Practitioner

## 2020-06-24 ENCOUNTER — Ambulatory Visit (INDEPENDENT_AMBULATORY_CARE_PROVIDER_SITE_OTHER): Payer: Medicaid Other | Admitting: Nurse Practitioner

## 2020-06-24 ENCOUNTER — Other Ambulatory Visit: Payer: Self-pay

## 2020-06-24 VITALS — BP 115/80 | HR 88 | Temp 97.9°F | Ht <= 58 in | Wt 149.2 lb

## 2020-06-24 DIAGNOSIS — E559 Vitamin D deficiency, unspecified: Secondary | ICD-10-CM

## 2020-06-24 DIAGNOSIS — E119 Type 2 diabetes mellitus without complications: Secondary | ICD-10-CM

## 2020-06-24 DIAGNOSIS — I10 Essential (primary) hypertension: Secondary | ICD-10-CM | POA: Diagnosis not present

## 2020-06-24 DIAGNOSIS — D509 Iron deficiency anemia, unspecified: Secondary | ICD-10-CM

## 2020-06-24 MED ORDER — LISINOPRIL 10 MG PO TABS: 10.0000 mg | ORAL_TABLET | Freq: Every day | ORAL | 1 refills | Status: DC

## 2020-06-24 MED ORDER — METFORMIN HCL 500 MG PO TABS: 500.0000 mg | ORAL_TABLET | Freq: Two times a day (BID) | ORAL | 1 refills | Status: DC

## 2020-06-24 NOTE — Progress Notes (Signed)
Subjective:  Patient ID: Debra Tanner, female    DOB: Oct 22, 1981  Age: 39 y.o. MRN: [id number]  CC:  Chief Complaint  Patient presents with  . Other    Vitamin D deficiency  . Diabetes  . Medication Refill    Lisinopril  . Anemia  . Hypertension      HPI  This patient arrives today for the above.  Vitamin D deficiency: Last serum level was collected approximately 3 months ago and it was 17.  She tells me she is not taking any supplements currently.  Diabetes: She has history of type 2 diabetes.  She does not check her blood sugar at home regularly, but tells me this morning she did check it before breakfast and it was 133.  Last A1c was collected 3 months ago and it was 9.8.  She continues on Metformin 500 mg twice a day.  She was told to start Jardiance, but has not done this so far.  She has seen an eye doctor within the last year and is scheduled to return in October.  She continues on ACE inhibitor she is not on statin.  Last LDL was 55 this was collected about 3 months ago.  Anemia: She has a history of iron deficiency anemia and continues on her iron supplement.  Hypertension: She continues on lisinopril and is tolerating this medication well     Past Medical History:  Diagnosis Date  . Anemia   . Depression   . Diabetes mellitus without complication ([place])   . Essential hypertension, benign 11/07/2019  . GAD (generalized anxiety disorder)   . Hypertension   . Trichimoniasis       Family History  Problem Relation Age of Onset  . Alcohol abuse Mother   . Cirrhosis Mother   . Alcohol abuse Father   . Heart disease Father   . Hypertension Father   . Diabetes Sister   . Sickle cell trait Sister   . Hypertension Maternal Grandmother   . Hyperlipidemia Maternal Grandmother   . Cancer Maternal Grandfather        lung  . Cancer Paternal Grandfather   . 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 Smoking status: Never Smoker  . Smokeless tobacco: Never Used  Substance Use Topics  . Alcohol use: Not Currently     Current Meds  Medication Sig  . ACCU-CHEK AVIVA PLUS test strip USE UP TO FOUR TIMES DAILY AS DIRECTED  . blood glucose meter kit and supplies KIT Dispense based on patient and insurance preference. Use up to four times daily as directed. (FOR ICD-9 250.00, 250.01).  . Cholecalciferol 250 MCG (10000 UT) TABS Take 2 tablets by mouth daily.  . Ferrous Sulfate (IRON) 325 (65 Fe) MG TABS Take 65 mg by mouth daily.  [employer] (ACCU-CHEK SOFTCLIX) lancets 1 each by Other route 2 (two) times daily. Use as instructed  . lisinopril (ZESTRIL) 10 MG tablet Take 1 tablet (10 mg total) by mouth daily.  . metFORMIN (GLUCOPHAGE) 500 MG tablet Take 1 tablet (500 mg total) by mouth 2 (two) times daily with a meal.  . [DISCONTINUED] lisinopril (ZESTRIL) 10 MG tablet TAKE 1 TABLET(10 MG) BY MOUTH DAILY  . [DISCONTINUED] metFORMIN (GLUCOPHAGE) 500 MG  tablet TAKE 1 TABLET BY MOUTH TWICE DAILY    ROS:  ROS   Objective:   Today's Vitals: BP 115/80 (BP Location: Left Arm, Patient Position: Sitting, Cuff Size: Normal)   Pulse 88   Temp 97.9 F (36.6 C) (Temporal)   Ht _0  (1.448 m)   Wt 149 lb 3.2 oz (67.7 kg)   SpO2 97%   BMI 32.29 kg/m  Vitals with BMI 06/24/2020 04/02/2020 03/14/2020  Height _1  _2  4' 10.8"  Weight 149 lbs 3 oz 146 lbs 148 lbs 10 oz  BMI 32.28 37.04 88.8  Systolic 916 945 038  Diastolic 80 95 85  Pulse 88 71 83     Physical Exam Vitals reviewed.  Constitutional:      General: She is not in acute distress.    Appearance: Normal appearance.  HENT:     Head: Normocephalic and atraumatic.  Cardiovascular:     Rate and Rhythm: Normal rate and regular rhythm.     Pulses: Normal pulses.     Heart sounds:  Normal heart sounds.  Pulmonary:     Effort: Pulmonary effort is normal.     Breath sounds: Normal breath sounds.  Skin:    General: Skin is warm and dry.  Neurological:     General: No focal deficit present.     Mental Status: She is alert and oriented to person, place, and time.  Psychiatric:        Mood and Affect: Mood normal.        Behavior: Behavior normal.        Judgment: Judgment normal.          Assessment and Plan   1. Diabetes mellitus without complication ([place])   2. Essential hypertension, benign   3. Iron deficiency anemia, unspecified iron deficiency anemia type   4. Vitamin D deficiency      Plan: 1.  I encouraged her to check her blood sugar fasting on a daily basis and to keep a log of this.  We also discussed avoiding sugary foods and beverages as well as avoiding sugar-free foods when possible.  I recommend she focus on eating whole foods and drinking water, black tea, and/or black coffee.  We also discussed that if she wants to know what a specific food does not her blood sugar she should eat the food by itself and then 2 hours later check her blood sugar.  If A1c remains elevated will need to make adjustments to her medication regimen.  We did briefly discuss Jardiance again, she does remain hesitant because she read that it can cause liver disease.  She has not gotten the PPSV23 vaccine and I encouraged her to consider this, she tells me she would like to do off on this vaccination but will consider this for the future.  She is encouraged to follow-up with her eye doctor as scheduled, she will be due for foot exam at next office visit.  She will continue on ACE inhibitor and I will hold off on prescribing statin therapy as her LDL is quite well controlled without it at this time.  May need to consider this in the future.  2.  She will continue on her lisinopril as currently prescribed.  3.  We will check CBC to monitor her anemia.  4.  I encouraged her to  start vitamin D3 supplement and recommend she start with 2000 IUs daily.  She tells me she understands.   Tests ordered Orders  Placed This Encounter  Procedures  . Hemoglobin A1c  . CBC  . Microalbumin/Creatinine Ratio, Urine      Meds ordered this encounter  Medications  . lisinopril (ZESTRIL) 10 MG tablet    Sig: Take 1 tablet (10 mg total) by mouth daily.    Dispense:  90 tablet    Refill:  1    Order Specific Question:   Supervising Provider    Answer:   Hurshel Party C [[id number]]  . metFORMIN (GLUCOPHAGE) 500 MG tablet    Sig: Take 1 tablet (500 mg total) by mouth 2 (two) times daily with a meal.    Dispense:  180 tablet    Refill:  1    Order Specific Question:   Supervising Provider    Answer:   Doree Albee [[id number]]    Patient to follow-up in 3 months or sooner as needed.  Ailene Ards, NP

## 2020-06-24 NOTE — Patient Instructions (Addendum)
Start taking vitamin D3 (this is found over the counter). Take 2,000IUs by mouth daily.     Diabetes Mellitus and Nutrition, Adult When you have diabetes (diabetes mellitus), it is very important to have healthy eating habits because your blood sugar (glucose) levels are greatly affected by what you eat and drink. Eating healthy foods in the appropriate amounts, at about the same times every day, can help you:  Control your blood glucose.  Lower your risk of heart disease.  Improve your blood pressure.  Reach or maintain a healthy weight. Every person with diabetes is different, and each person has different needs for a meal plan. Your health care provider may recommend that you work with a diet and nutrition specialist (dietitian) to make a meal plan that is best for you. Your meal plan may vary depending on factors such as:  The calories you need.  The medicines you take.  Your weight.  Your blood glucose, blood pressure, and cholesterol levels.  Your activity level.  Other health conditions you have, such as heart or kidney disease. How do carbohydrates affect me? Carbohydrates, also called carbs, affect your blood glucose level more than any other type of food. Eating carbs naturally raises the amount of glucose in your blood. Carb counting is a method for keeping track of how many carbs you eat. Counting carbs is important to keep your blood glucose at a healthy level, especially if you use insulin or take certain oral diabetes medicines. It is important to know how many carbs you can safely have in each meal. This is different for every person. Your dietitian can help you calculate how many carbs you should have at each meal and for each snack. Foods that contain carbs include:  Bread, cereal, rice, pasta, and crackers.  Potatoes and corn.  Peas, beans, and lentils.  Milk and yogurt.  Fruit and juice.  Desserts, such as cakes, cookies, ice cream, and candy. How does  alcohol affect me? Alcohol can cause a sudden decrease in blood glucose (hypoglycemia), especially if you use insulin or take certain oral diabetes medicines. Hypoglycemia can be a life-threatening condition. Symptoms of hypoglycemia (sleepiness, dizziness, and confusion) are similar to symptoms of having too much alcohol. If your health care provider says that alcohol is safe for you, follow these guidelines:  Limit alcohol intake to no more than 1 drink per day for nonpregnant women and 2 drinks per day for men. One drink equals 12 oz of beer, 5 oz of wine, or 1 oz of hard liquor.  Do not drink on an empty stomach.  Keep yourself hydrated with water, diet soda, or unsweetened iced tea.  Keep in mind that regular soda, juice, and other mixers may contain a lot of sugar and must be counted as carbs. What are tips for following this plan?  Reading food labels  Start by checking the serving size on the "Nutrition Facts" label of packaged foods and drinks. The amount of calories, carbs, fats, and other nutrients listed on the label is based on one serving of the item. Many items contain more than one serving per package.  Check the total grams (g) of carbs in one serving. You can calculate the number of servings of carbs in one serving by dividing the total carbs by 15. For example, if a food has 30 g of total carbs, it would be equal to 2 servings of carbs.  Check the number of grams (g) of saturated and trans fats  in one serving. Choose foods that have low or no amount of these fats.  Check the number of milligrams (mg) of salt (sodium) in one serving. Most people should limit total sodium intake to less than 2,300 mg per day.  Always check the nutrition information of foods labeled as "low-fat" or "nonfat". These foods may be higher in added sugar or refined carbs and should be avoided.  Talk to your dietitian to identify your daily goals for nutrients listed on the  label. Shopping  Avoid buying canned, premade, or processed foods. These foods tend to be high in fat, sodium, and added sugar.  Shop around the outside edge of the grocery store. This includes fresh fruits and vegetables, bulk grains, fresh meats, and fresh dairy. Cooking  Use low-heat cooking methods, such as baking, instead of high-heat cooking methods like deep frying.  Cook using healthy oils, such as olive, canola, or sunflower oil.  Avoid cooking with butter, cream, or high-fat meats. Meal planning  Eat meals and snacks regularly, preferably at the same times every day. Avoid going long periods of time without eating.  Eat foods high in fiber, such as fresh fruits, vegetables, beans, and whole grains. Talk to your dietitian about how many servings of carbs you can eat at each meal.  Eat 4-6 ounces (oz) of lean protein each day, such as lean meat, chicken, fish, eggs, or tofu. One oz of lean protein is equal to: ? 1 oz of meat, chicken, or fish. ? 1 egg. ?  cup of tofu.  Eat some foods each day that contain healthy fats, such as avocado, nuts, seeds, and fish. Lifestyle  Check your blood glucose regularly.  Exercise regularly as told by your health care provider. This may include: ? 150 minutes of moderate-intensity or vigorous-intensity exercise each week. This could be brisk walking, biking, or water aerobics. ? Stretching and doing strength exercises, such as yoga or weightlifting, at least 2 times a week.  Take medicines as told by your health care provider.  Do not use any products that contain nicotine or tobacco, such as cigarettes and e-cigarettes. If you need help quitting, ask your health care provider.  Work with a Social worker or diabetes educator to identify strategies to manage stress and any emotional and social challenges. Questions to ask a health care provider  Do I need to meet with a diabetes educator?  Do I need to meet with a dietitian?  What  number can I call if I have questions?  When are the best times to check my blood glucose? Where to find more information:  American Diabetes Association: diabetes.org  Academy of Nutrition and Dietetics: www.eatright.[employer] of Diabetes and Digestive and Kidney Diseases (NIH): [employer] Summary  A healthy meal plan will help you control your blood glucose and maintain a healthy lifestyle.  Working with a diet and nutrition specialist (dietitian) can help you make a meal plan that is best for you.  Keep in mind that carbohydrates (carbs) and alcohol have immediate effects on your blood glucose levels. It is important to count carbs and to use alcohol carefully. This information is not intended to replace advice given to you by your health care provider. Make sure you discuss any questions you have with your health care provider. Document Revised: 10/22/2017 Document Reviewed: 12/14/2016 Elsevier Patient Education  2020 [employer].

## 2020-06-25 LAB — CBC
HCT: 37.2 % (ref 35.0–45.0)
Hemoglobin: 12.2 g/dL (ref 11.7–15.5)
MCH: 28.7 pg (ref 27.0–33.0)
MCHC: 32.8 g/dL (ref 32.0–36.0)
MCV: 87.5 fL (ref 80.0–100.0)
MPV: 11.6 fL (ref 7.5–12.5)
Platelets: 357 10*3/uL (ref 140–400)
RBC: 4.25 10*6/uL (ref 3.80–5.10)
RDW: 15.1 % — ABNORMAL HIGH (ref 11.0–15.0)
WBC: 8.2 10*3/uL (ref 3.8–10.8)

## 2020-06-25 LAB — MICROALBUMIN / CREATININE URINE RATIO
Creatinine, Urine: 144 mg/dL (ref 20–275)
Microalb Creat Ratio: 5 mcg/mg creat (ref <30)
Microalb, Ur: 0.7 mg/dL

## 2020-06-25 LAB — HEMOGLOBIN A1C
Hgb A1c MFr Bld: 5.8 % of total Hgb — ABNORMAL HIGH (ref <5.7)
Mean Plasma Glucose: 120 (calc)
eAG (mmol/L): 6.6 (calc)

## 2020-07-23 ENCOUNTER — Telehealth (INDEPENDENT_AMBULATORY_CARE_PROVIDER_SITE_OTHER): Payer: Self-pay

## 2020-07-23 NOTE — Telephone Encounter (Signed)
Pt called and ask for results. Re read results to patient. Per Judson Roch notes in [place] to patient:   Your A1c is much better than last time! Good job! I do not think we need to make any changes to your medication to control your diabetes at this time. You are also no longer anemic, and your urine does not show elevated levels of albumin. This is a good sign regarding kidney function especially related to diabetes. If you have any questions please let me know. Thank you.

## 2020-07-24 NOTE — Telephone Encounter (Signed)
I would recommend that she continue on her iron for now.

## 2020-07-24 NOTE — Telephone Encounter (Signed)
Called her back and left voicemail on cell phone of instructions.

## 2020-09-29 ENCOUNTER — Other Ambulatory Visit (INDEPENDENT_AMBULATORY_CARE_PROVIDER_SITE_OTHER): Payer: Self-pay | Admitting: Nurse Practitioner

## 2020-09-29 DIAGNOSIS — E119 Type 2 diabetes mellitus without complications: Secondary | ICD-10-CM

## 2020-09-30 ENCOUNTER — Ambulatory Visit (INDEPENDENT_AMBULATORY_CARE_PROVIDER_SITE_OTHER): Payer: Medicaid Other | Admitting: Nurse Practitioner

## 2020-10-16 DIAGNOSIS — H5213 Myopia, bilateral: Secondary | ICD-10-CM | POA: Diagnosis not present

## 2020-12-22 ENCOUNTER — Other Ambulatory Visit (INDEPENDENT_AMBULATORY_CARE_PROVIDER_SITE_OTHER): Payer: Self-pay | Admitting: Nurse Practitioner

## 2020-12-22 DIAGNOSIS — I10 Essential (primary) hypertension: Secondary | ICD-10-CM

## 2020-12-31 ENCOUNTER — Telehealth (INDEPENDENT_AMBULATORY_CARE_PROVIDER_SITE_OTHER): Payer: Self-pay | Admitting: Internal Medicine

## 2020-12-31 ENCOUNTER — Other Ambulatory Visit (INDEPENDENT_AMBULATORY_CARE_PROVIDER_SITE_OTHER): Payer: Self-pay | Admitting: Internal Medicine

## 2020-12-31 DIAGNOSIS — E119 Type 2 diabetes mellitus without complications: Secondary | ICD-10-CM

## 2020-12-31 MED ORDER — METFORMIN HCL 500 MG PO TABS: 500.0000 mg | ORAL_TABLET | Freq: Two times a day (BID) | ORAL | 0 refills | Status: DC

## 2020-12-31 NOTE — Telephone Encounter (Signed)
I refused this-she was a no show last visit.She needs to make an appt before I can refill it.

## 2020-12-31 NOTE — Telephone Encounter (Signed)
Yes and I noticed when she called that we have not seen her since Aug so I made her an appointment on 01/15/21 to be seen.  And she is aware that she needs to be seen to refill her prescriptions.

## 2020-12-31 NOTE — Telephone Encounter (Signed)
Okay, I have sent her 1 month supply of medication.

## 2021-01-15 ENCOUNTER — Ambulatory Visit (INDEPENDENT_AMBULATORY_CARE_PROVIDER_SITE_OTHER): Payer: Medicaid Other | Admitting: Nurse Practitioner

## 2021-01-15 ENCOUNTER — Other Ambulatory Visit: Payer: Self-pay

## 2021-01-15 ENCOUNTER — Encounter (INDEPENDENT_AMBULATORY_CARE_PROVIDER_SITE_OTHER): Payer: Self-pay | Admitting: Nurse Practitioner

## 2021-01-15 VITALS — BP 146/90 | HR 85 | Temp 97.2°F | Ht <= 58 in | Wt 155.2 lb

## 2021-01-15 DIAGNOSIS — I10 Essential (primary) hypertension: Secondary | ICD-10-CM | POA: Diagnosis not present

## 2021-01-15 DIAGNOSIS — E119 Type 2 diabetes mellitus without complications: Secondary | ICD-10-CM

## 2021-01-15 DIAGNOSIS — Z1159 Encounter for screening for other viral diseases: Secondary | ICD-10-CM | POA: Diagnosis not present

## 2021-01-15 DIAGNOSIS — D509 Iron deficiency anemia, unspecified: Secondary | ICD-10-CM

## 2021-01-15 DIAGNOSIS — E559 Vitamin D deficiency, unspecified: Secondary | ICD-10-CM | POA: Diagnosis not present

## 2021-01-15 MED ORDER — METFORMIN HCL 500 MG PO TABS: 500.0000 mg | ORAL_TABLET | Freq: Two times a day (BID) | ORAL | 0 refills | Status: DC

## 2021-01-15 MED ORDER — LISINOPRIL 10 MG PO TABS: 10.0000 mg | ORAL_TABLET | Freq: Every day | ORAL | 0 refills | Status: DC

## 2021-01-15 NOTE — Progress Notes (Signed)
Subjective:  Patient ID: Debra Tanner, female    DOB: 01/25/1981  Age: 40 y.o. MRN: [id number]  CC:  Chief Complaint  Patient presents with  . Diabetes  . Hypertension  . Other    Vitamin D deficiency  . Anemia      HPI  This patient arrives today for the above.  Diabetes: She continues on Metformin 500 mg twice a day.  Last A1c was 5.8.  She is on lisinopril.  She tells me she is not using any contraception to prevent pregnancy but she is not regularly sexually active.  Periods are regular.  Last time urine was checked for albuminuria was in April 2021 and it was negative.  Hypertension: She continues on lisinopril, and is tolerating this medication well.  Vitamin D deficiency: She used to be on a vitamin D3 supplement but has not been taking it regularly.  Anemia: She used to be on iron supplement, but has stopped taking this.  She tells me she is noted she has been craving ice lately.  Past Medical History:  Diagnosis Date  . Anemia   . Depression   . Diabetes mellitus without complication ([place])   . Essential hypertension, benign 11/07/2019  . GAD (generalized anxiety disorder)   . Hypertension   . Trichimoniasis       Family History  Problem Relation Age of Onset  . Alcohol abuse Mother   . Cirrhosis Mother   . Alcohol abuse Father   . Heart disease Father   . Hypertension Father   . Diabetes Sister   . Sickle cell trait Sister   . Hypertension Maternal Grandmother   . Hyperlipidemia Maternal Grandmother   . Cancer Maternal Grandfather        lung  . Cancer Paternal Grandfather   . 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 Smoking status: Never Smoker  . Smokeless tobacco: Never Used  Substance Use Topics  . Alcohol use: Not Currently      Current Meds  Medication Sig  . [DISCONTINUED] lisinopril (ZESTRIL) 10 MG tablet TAKE 1 TABLET(10 MG) BY MOUTH DAILY  . [DISCONTINUED] metFORMIN (GLUCOPHAGE) 500 MG tablet Take 1 tablet (500 mg total) by mouth in the morning and at bedtime.    ROS:  Review of Systems  Eyes: Negative for blurred vision.  Respiratory: Negative for shortness of breath.   Cardiovascular: Negative for chest pain.  Neurological: Negative for dizziness and headaches.     Objective:   Today's Vitals: BP (!) 146/90   Pulse 85   Temp (!) 97.2 F (36.2 C)   Ht _0  (1.448 m)   Wt 155 lb 3.2 oz (70.4 kg)   LMP 12/21/2020   SpO2 98%   BMI 33.58 kg/m  Vitals with BMI 01/15/2021 06/24/2020 04/02/2020  Height _1  _2  _3   Weight 155 lbs 3 oz 149 lbs 3 oz 146 lbs  BMI 33.58 35.46 56.81  Systolic 275 170 017  Diastolic 90 80 95  Pulse 85 88 71     Physical Exam Vitals reviewed.  Constitutional:      General: She is not in acute distress.    Appearance: Normal appearance.  HENT:     Head: Normocephalic and atraumatic.  Neck:     Vascular: No carotid bruit.  Cardiovascular:     Rate and Rhythm: Normal rate and regular rhythm.     Pulses: Normal pulses.          Dorsalis pedis pulses are 2+ on the right side and 2+ on the left side.     Heart sounds: Normal heart sounds.  Pulmonary:     Effort: Pulmonary effort is normal.     Breath sounds: Normal breath sounds.  Musculoskeletal:     Right foot: No deformity.     Left foot: No deformity.  Feet:     Right foot:     Protective Sensation: 10 sites tested. 10 sites sensed.     Skin integrity: Skin integrity normal.     Toenail Condition: Right toenails are normal.     Left foot:     Protective Sensation: 10 sites tested. 10 sites sensed.     Skin integrity: Skin integrity normal.     Toenail Condition: Left toenails are normal.  Skin:    General: Skin is warm and dry.  Neurological:     General: No focal deficit present.      Mental Status: She is alert and oriented to person, place, and time.  Psychiatric:        Mood and Affect: Mood normal.        Behavior: Behavior normal.        Judgment: Judgment normal.          Assessment and Plan   1. Vitamin D deficiency   2. Essential hypertension, benign   3. Diabetes mellitus without complication ([place])   4. Iron deficiency anemia, unspecified iron deficiency anemia type   5. Encounter for hepatitis C screening test for low risk patient      Plan: 1.  We will check serum vitamin D level today, she is agreeable to restarting vitamin D supplement if needed. 2.  She will continue on lisinopril and I encouraged her to focus on increasing her water intake, reducing salt intake, and increasing her exercise as the weather improves.  May need to consider adjustment of medication if blood pressure remains elevated next office visit.  We also discussed that lisinopril should not be taken in the event of plantar on planned pregnancy.  She was told if she is late on her menstrual cycle she is to notify us right away at which point we can rule in or rule out pregnancy and determine if we need to make medication changes.  I offered to change to a different medication that is considered more safe in pregnancy, but she would prefer to stay on her lisinopril for now.  She is aware of the risks. 3.  We will check A1c today for further evaluation. 4.  We will check CBC, ferritin, and iron level today for further evaluation. 5.  We will screen for hepatitis C today.   Tests ordered Orders Placed This Encounter  Procedures  . CBC with Differential/Platelets  . Ferritin  . Iron  . Hemoglobin A1c  . CMP with eGFR([facility])  . Vitamin D, 25-hydroxy  . Hep C Antibody      Meds ordered this encounter  Medications  . lisinopril (ZESTRIL) 10 MG tablet    Sig: Take 1 tablet (10 mg total) by mouth daily.    Dispense:  90 tablet    Refill:  0    Order Specific Question:    Supervising Provider    Answer:  [place], [place] [[id number]]  . metFORMIN (GLUCOPHAGE) 500 MG tablet    Sig: Take 1 tablet (500 mg total) by mouth in the morning and at bedtime.    Dispense:  180 tablet    Refill:  0    Order Specific Question:   Supervising Provider    Answer:   Doree Albee [[id number]]    Patient to follow-up in 3 months or sooner as needed.  Ailene Ards, NP

## 2021-01-16 LAB — IRON: Iron: 34 ug/dL — ABNORMAL LOW (ref 40–190)

## 2021-01-16 LAB — COMPLETE METABOLIC PANEL WITH GFR
AG Ratio: 1.3 (calc) (ref 1.0–2.5)
ALT: 9 U/L (ref 6–29)
AST: 9 U/L — ABNORMAL LOW (ref 10–30)
Albumin: 3.9 g/dL (ref 3.6–5.1)
Alkaline phosphatase (APISO): 70 U/L (ref 31–125)
BUN: 7 mg/dL (ref 7–25)
CO2: 21 mmol/L (ref 20–32)
Calcium: 9.8 mg/dL (ref 8.6–10.2)
Chloride: 105 mmol/L (ref 98–110)
Creat: 0.71 mg/dL (ref 0.50–1.10)
GFR, Est African American: 124 mL/min/{1.73_m2} (ref >=60)
GFR, Est Non African American: 107 mL/min/{1.73_m2} (ref >=60)
Globulin: 2.9 g/dL (calc) (ref 1.9–3.7)
Glucose, Bld: 315 mg/dL — ABNORMAL HIGH (ref 65–139)
Potassium: 4.7 mmol/L (ref 3.5–5.3)
Sodium: 136 mmol/L (ref 135–146)
Total Bilirubin: 0.3 mg/dL (ref 0.2–1.2)
Total Protein: 6.8 g/dL (ref 6.1–8.1)

## 2021-01-16 LAB — CBC WITH DIFFERENTIAL/PLATELET
Absolute Monocytes: 618 {cells}/uL (ref 200–950)
Basophils Absolute: 82 {cells}/uL (ref 0–200)
Basophils Relative: 0.8 %
Eosinophils Absolute: 165 {cells}/uL (ref 15–500)
Eosinophils Relative: 1.6 %
HCT: 35.4 % (ref 35.0–45.0)
Hemoglobin: 11 g/dL — ABNORMAL LOW (ref 11.7–15.5)
Lymphs Abs: 3018 {cells}/uL (ref 850–3900)
MCH: 27 pg (ref 27.0–33.0)
MCHC: 31.1 g/dL — ABNORMAL LOW (ref 32.0–36.0)
MCV: 87 fL (ref 80.0–100.0)
MPV: 11.6 fL (ref 7.5–12.5)
Monocytes Relative: 6 %
Neutro Abs: 6417 {cells}/uL (ref 1500–7800)
Neutrophils Relative %: 62.3 %
Platelets: 355 Thousand/uL (ref 140–400)
RBC: 4.07 Million/uL (ref 3.80–5.10)
RDW: 13 % (ref 11.0–15.0)
Total Lymphocyte: 29.3 %
WBC: 10.3 Thousand/uL (ref 3.8–10.8)

## 2021-01-16 LAB — HEMOGLOBIN A1C
Hgb A1c MFr Bld: 7.4 % of total Hgb — ABNORMAL HIGH (ref <5.7)
Mean Plasma Glucose: 166 mg/dL
eAG (mmol/L): 9.2 mmol/L

## 2021-01-16 LAB — VITAMIN D 25 HYDROXY (VIT D DEFICIENCY, FRACTURES): Vit D, 25-Hydroxy: 22 ng/mL — ABNORMAL LOW (ref 30–100)

## 2021-01-16 LAB — FERRITIN: Ferritin: 5 ng/mL — ABNORMAL LOW (ref 16–154)

## 2021-01-16 LAB — HEPATITIS C ANTIBODY
Hepatitis C Ab: NONREACTIVE
SIGNAL TO CUT-OFF: 0.01 (ref <1.00)

## 2021-01-20 ENCOUNTER — Telehealth (INDEPENDENT_AMBULATORY_CARE_PROVIDER_SITE_OTHER): Payer: Self-pay

## 2021-01-20 NOTE — Telephone Encounter (Signed)
Patient called and requested her lab results. Patient stated that she is not able to get into her MyChart and I have reset for her and she knows now that she can get in her MyChart and reset her own password. I also gave her the lab results and patient verbalized an understanding.

## 2021-02-10 ENCOUNTER — Other Ambulatory Visit (INDEPENDENT_AMBULATORY_CARE_PROVIDER_SITE_OTHER): Payer: Self-pay | Admitting: Internal Medicine

## 2021-02-10 ENCOUNTER — Telehealth (INDEPENDENT_AMBULATORY_CARE_PROVIDER_SITE_OTHER): Payer: Self-pay | Admitting: Internal Medicine

## 2021-02-10 DIAGNOSIS — E119 Type 2 diabetes mellitus without complications: Secondary | ICD-10-CM

## 2021-02-10 DIAGNOSIS — I10 Essential (primary) hypertension: Secondary | ICD-10-CM

## 2021-02-10 DIAGNOSIS — E559 Vitamin D deficiency, unspecified: Secondary | ICD-10-CM

## 2021-02-10 DIAGNOSIS — D509 Iron deficiency anemia, unspecified: Secondary | ICD-10-CM

## 2021-02-10 MED ORDER — METFORMIN HCL 500 MG PO TABS: 500.0000 mg | ORAL_TABLET | Freq: Two times a day (BID) | ORAL | 0 refills | Status: DC

## 2021-02-10 NOTE — Telephone Encounter (Signed)
Done

## 2021-04-15 ENCOUNTER — Ambulatory Visit (INDEPENDENT_AMBULATORY_CARE_PROVIDER_SITE_OTHER): Payer: Medicaid Other | Admitting: Internal Medicine

## 2021-04-15 ENCOUNTER — Other Ambulatory Visit (INDEPENDENT_AMBULATORY_CARE_PROVIDER_SITE_OTHER): Payer: Self-pay | Admitting: Internal Medicine

## 2021-04-17 ENCOUNTER — Other Ambulatory Visit (INDEPENDENT_AMBULATORY_CARE_PROVIDER_SITE_OTHER): Payer: Self-pay | Admitting: Nurse Practitioner

## 2021-04-17 DIAGNOSIS — E559 Vitamin D deficiency, unspecified: Secondary | ICD-10-CM

## 2021-04-17 DIAGNOSIS — I10 Essential (primary) hypertension: Secondary | ICD-10-CM

## 2021-04-17 DIAGNOSIS — D509 Iron deficiency anemia, unspecified: Secondary | ICD-10-CM

## 2021-04-17 DIAGNOSIS — E119 Type 2 diabetes mellitus without complications: Secondary | ICD-10-CM

## 2021-05-02 ENCOUNTER — Other Ambulatory Visit (INDEPENDENT_AMBULATORY_CARE_PROVIDER_SITE_OTHER): Payer: Self-pay | Admitting: Internal Medicine

## 2021-05-02 DIAGNOSIS — E559 Vitamin D deficiency, unspecified: Secondary | ICD-10-CM

## 2021-05-02 DIAGNOSIS — E119 Type 2 diabetes mellitus without complications: Secondary | ICD-10-CM

## 2021-05-02 DIAGNOSIS — D509 Iron deficiency anemia, unspecified: Secondary | ICD-10-CM

## 2021-05-02 DIAGNOSIS — I10 Essential (primary) hypertension: Secondary | ICD-10-CM

## 2021-05-08 ENCOUNTER — Encounter (INDEPENDENT_AMBULATORY_CARE_PROVIDER_SITE_OTHER): Payer: Self-pay | Admitting: Internal Medicine

## 2021-05-08 ENCOUNTER — Ambulatory Visit (INDEPENDENT_AMBULATORY_CARE_PROVIDER_SITE_OTHER): Payer: Medicaid Other | Admitting: Internal Medicine

## 2021-05-08 ENCOUNTER — Other Ambulatory Visit: Payer: Self-pay

## 2021-05-08 VITALS — BP 136/88 | HR 89 | Temp 98.0°F | Resp 17 | Ht <= 58 in | Wt 154.0 lb

## 2021-05-08 DIAGNOSIS — E119 Type 2 diabetes mellitus without complications: Secondary | ICD-10-CM | POA: Diagnosis not present

## 2021-05-08 DIAGNOSIS — I10 Essential (primary) hypertension: Secondary | ICD-10-CM | POA: Diagnosis not present

## 2021-05-08 DIAGNOSIS — N920 Excessive and frequent menstruation with regular cycle: Secondary | ICD-10-CM | POA: Diagnosis not present

## 2021-05-08 DIAGNOSIS — D509 Iron deficiency anemia, unspecified: Secondary | ICD-10-CM

## 2021-05-08 DIAGNOSIS — E559 Vitamin D deficiency, unspecified: Secondary | ICD-10-CM | POA: Diagnosis not present

## 2021-05-08 MED ORDER — LISINOPRIL 10 MG PO TABS: 10.0000 mg | ORAL_TABLET | Freq: Every day | ORAL | 0 refills | Status: DC

## 2021-05-08 NOTE — Progress Notes (Signed)
Metrics: Intervention Frequency ACO  Documented Smoking Status Yearly  Screened one or more times in 24 months  Cessation Counseling or  Active cessation medication Past 24 months  Past 24 months   Guideline developer: UpToDate (See UpToDate for funding source) Date Released: 2014       Wellness Office Visit  Subjective:  Patient ID: Debra Tanner, female    DOB: Feb 15, 1981  Age: 40 y.o. MRN: [id number]  CC: This lady comes in for follow-up of hypertension, diabetes, iron deficiency anemia which is due to menorrhagia. HPI  She was seen by OB/GYN 1 year ago and had a pelvic ultrasound previously in November 2019 which showed fibroid which was not deemed to be the cause of the menorrhagia. She continues on lisinopril for hypertension. She has been taking metformin twice a day.  Her last hemoglobin A1c was elevated at 7.4%.  She has no specific complaints today. Past Medical History:  Diagnosis Date   Anemia    Depression    Diabetes mellitus without complication ([place])    Essential hypertension, benign 11/07/2019   GAD (generalized anxiety disorder)    Hypertension    Trichimoniasis    History reviewed. No pertinent surgical history.   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   metFORMIN (GLUCOPHAGE) 500 MG tablet Take 1 tablet (500 mg total) by mouth in the morning and at bedtime.   [DISCONTINUED] lisinopril (ZESTRIL) 10 MG tablet Take 1 tablet (10 mg total) by mouth daily.     [place] Office Visit from 05/08/2021 in [facility]  PHQ-9 Total Score 5       Objective:   Today's Vitals: BP 136/88 (BP Location: Left Arm, Patient Position: Sitting, Cuff Size: Normal)   Pulse 89   Temp 98 F (36.7 C) (Temporal)   Resp 17   Ht 4\' 9"  (1.448 m)   Wt 154 lb (69.9 kg)   PF 98 L/min   BMI 33.33 kg/m  Vitals with BMI 05/08/2021 01/15/2021 06/24/2020  Height 4\' 9"  4\' 9"  4\' 9"   Weight 154 lbs 155 lbs 3 oz 149 lbs 3 oz  BMI 33.32 58.09 98.33  Systolic 825 053 976  Diastolic 88 90 80  Pulse 89 85 88     Physical Exam  Her blood pressure is slightly elevated today.  No other new physical findings.     Assessment   1. Essential hypertension, benign   2. Diabetes mellitus without complication ([place])   3. Iron deficiency anemia, unspecified iron deficiency anemia type   4.  Menorrhagia with regular cycle   5. Vitamin D deficiency       Tests ordered Orders Placed This Encounter  Procedures   COMPLETE METABOLIC PANEL WITH GFR   Hemoglobin A1c   T3, free   T4, free   TSH   CBC   Ambulatory referral to Gynecology      Plan: 1.  Continue with lisinopril and we will check renal function. 2.  Continue with metformin and we will check an A1c. 3.  I recommended that she goes to see gynecology regarding her menorrhagia and we will check a CBC to make sure there is no further decrease in the hemoglobin.  I will refer her again to gynecology. 4.  Follow-up with Debra Roch in 3 months.   Meds ordered this encounter  Medications   lisinopril (ZESTRIL) 10 MG tablet    Sig: Take 1 tablet (10 mg total) by mouth daily.    Dispense:  90 tablet    Refill:  0     Stefan Markarian Luther Parody, MD

## 2021-05-08 NOTE — Progress Notes (Signed)
Refills needed.  Need a new CBG machine to update. Kit & strips. [facility]

## 2021-05-09 ENCOUNTER — Telehealth (INDEPENDENT_AMBULATORY_CARE_PROVIDER_SITE_OTHER): Payer: Self-pay

## 2021-05-09 LAB — COMPLETE METABOLIC PANEL WITH GFR
AG Ratio: 1.4 (calc) (ref 1.0–2.5)
ALT: 10 U/L (ref 6–29)
AST: 11 U/L (ref 10–30)
Albumin: 3.9 g/dL (ref 3.6–5.1)
Alkaline phosphatase (APISO): 75 U/L (ref 31–125)
BUN/Creatinine Ratio: 8 (calc) (ref 6–22)
BUN: 6 mg/dL — ABNORMAL LOW (ref 7–25)
CO2: 23 mmol/L (ref 20–32)
Calcium: 9.5 mg/dL (ref 8.6–10.2)
Chloride: 100 mmol/L (ref 98–110)
Creat: 0.79 mg/dL (ref 0.50–1.10)
GFR, Est African American: 109 mL/min/{1.73_m2} (ref >=60)
GFR, Est Non African American: 94 mL/min/{1.73_m2} (ref >=60)
Globulin: 2.8 g/dL (calc) (ref 1.9–3.7)
Glucose, Bld: 475 mg/dL — ABNORMAL HIGH (ref 65–139)
Potassium: 4.9 mmol/L (ref 3.5–5.3)
Sodium: 133 mmol/L — ABNORMAL LOW (ref 135–146)
Total Bilirubin: 0.4 mg/dL (ref 0.2–1.2)
Total Protein: 6.7 g/dL (ref 6.1–8.1)

## 2021-05-09 LAB — T4, FREE: Free T4: 1.2 ng/dL (ref 0.8–1.8)

## 2021-05-09 LAB — CBC
HCT: 40.3 % (ref 35.0–45.0)
Hemoglobin: 12.9 g/dL (ref 11.7–15.5)
MCH: 30.8 pg (ref 27.0–33.0)
MCHC: 32 g/dL (ref 32.0–36.0)
MCV: 96.2 fL (ref 80.0–100.0)
MPV: 11.7 fL (ref 7.5–12.5)
Platelets: 328 10*3/uL (ref 140–400)
RBC: 4.19 10*6/uL (ref 3.80–5.10)
RDW: 11.9 % (ref 11.0–15.0)
WBC: 6.9 10*3/uL (ref 3.8–10.8)

## 2021-05-09 LAB — HEMOGLOBIN A1C
Hgb A1c MFr Bld: 11.6 % of total Hgb — ABNORMAL HIGH (ref <5.7)
Mean Plasma Glucose: 286 mg/dL
eAG (mmol/L): 15.9 mmol/L

## 2021-05-09 LAB — T3, FREE: T3, Free: 2.4 pg/mL (ref 2.3–4.2)

## 2021-05-09 LAB — TSH: TSH: 1.46 mIU/L

## 2021-05-09 NOTE — Telephone Encounter (Signed)
Seen her labs on the fax today as a Critical for A1c & glucose was high levels.  Glucose 457 H,  BUN 6 L,  SODIUM 133 L,  A1C 11.6 H  ALL OTHER TEST FAR AS T3,T4 & CBC SEEM TO SHOW NORMAL LEVEL FROM VIA FAX. CALLING TO PT TO SETUP A VISIT TO COME BACK TO OFFICE TO DISCUSS WITH DR Anastasio Champion.   MOM ANSWER PHONE.  TOLD HER WE WILL CALL HER BACK ON MONDAY. TO SET A F/U APPT TO DISCUSS LAB WORK.

## 2021-05-12 ENCOUNTER — Ambulatory Visit (INDEPENDENT_AMBULATORY_CARE_PROVIDER_SITE_OTHER): Payer: Medicaid Other | Admitting: Internal Medicine

## 2021-05-14 ENCOUNTER — Encounter (INDEPENDENT_AMBULATORY_CARE_PROVIDER_SITE_OTHER): Payer: Self-pay | Admitting: Internal Medicine

## 2021-05-14 ENCOUNTER — Other Ambulatory Visit: Payer: Self-pay

## 2021-05-14 ENCOUNTER — Ambulatory Visit (INDEPENDENT_AMBULATORY_CARE_PROVIDER_SITE_OTHER): Payer: Medicaid Other | Admitting: Internal Medicine

## 2021-05-14 VITALS — BP 120/70 | HR 87 | Temp 97.8°F | Ht <= 58 in | Wt 143.8 lb

## 2021-05-14 DIAGNOSIS — E1165 Type 2 diabetes mellitus with hyperglycemia: Secondary | ICD-10-CM | POA: Diagnosis not present

## 2021-05-14 DIAGNOSIS — I10 Essential (primary) hypertension: Secondary | ICD-10-CM | POA: Diagnosis not present

## 2021-05-14 MED ORDER — METFORMIN HCL 1000 MG PO TABS: 1000.0000 mg | ORAL_TABLET | Freq: Two times a day (BID) | ORAL | 1 refills | Status: DC

## 2021-05-14 NOTE — Progress Notes (Signed)
Metrics: Intervention Frequency ACO  Documented Smoking Status Yearly  Screened one or more times in 24 months  Cessation Counseling or  Active cessation medication Past 24 months  Past 24 months   Guideline developer: UpToDate (See UpToDate for funding source) Date Released: 2014       Wellness Office Visit  Subjective:  Patient ID: Debra Tanner, female    DOB: 04-Jul-1981  Age: 40 y.o. MRN: [id number]  CC: This lady comes in at my request to discuss her uncontrolled diabetes. HPI  Her hemoglobin A1c went from 7.4% about 3 months ago to 11.6% now.  Her blood glucose was 475. She continues on metformin 500 mg twice a day. She admits that she drinks sodas every day and juices every day. Past Medical History:  Diagnosis Date   Anemia    Depression    Diabetes mellitus without complication ([place])    Essential hypertension, benign 11/07/2019   GAD (generalized anxiety disorder)    Hypertension    Trichimoniasis    History reviewed. No pertinent surgical history.   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   lisinopril (ZESTRIL) 10 MG tablet Take 1 tablet (10 mg total) by mouth daily.   metFORMIN (GLUCOPHAGE) 1000  MG tablet Take 1 tablet (1,000 mg total) by mouth 2 (two) times daily with a meal.   [DISCONTINUED] metFORMIN (GLUCOPHAGE) 500 MG tablet Take 1 tablet (500 mg total) by mouth in the morning and at bedtime.     [place] Office Visit from 05/08/2021 in [facility]  PHQ-9 Total Score 5       Objective:   Today's Vitals: BP 120/70 (BP Location: Left Arm, Patient Position: Sitting, Cuff Size: Normal)   Pulse 87   Temp 97.8 F (36.6 C)   Ht 4\' 9"  (1.448 m)   Wt 143 lb 12.8 oz (65.2 kg)   SpO2 99%   BMI 31.12 kg/m  Vitals with BMI 05/14/2021 05/08/2021 01/15/2021  Height 4\' 9"  4\' 9"  4\' 9"   Weight 143 lbs 13 oz 154 lbs 155 lbs 3 oz  BMI 31.11 46.27 03.50  Systolic 093 818 299  Diastolic 70 88 90  Pulse 87 89 85     Physical Exam  She has lost weight since her last visit.  Blood pressure is acceptable.     Assessment   1. Essential hypertension, benign   2. Uncontrolled type 2 diabetes mellitus with hyperglycemia ([place])       Tests ordered No orders of the defined types were placed in this encounter.  Plan: 1.  I am going to increase her metformin to 1000 mg twice a day. 2.  We discussed nutrition at length and the concept of intermittent fasting combined with a plant-based diet, ensuring hydration.  I told her that she needs to completely stop all sodas and juices.  I told her that her eating window.  Should be from 12 noon to about 7 PM.  She can drink black coffee or coffee with cream.  No added sweeteners or sugar. 3.  Continue with the same dose of lisinopril for hypertension. 4.  I will see her in about a month's time for close monitoring.    Meds ordered this encounter  Medications   metFORMIN (GLUCOPHAGE) 1000 MG tablet    Sig: Take 1 tablet (1,000 mg total) by mouth 2 (two) times daily with a meal.    Dispense:  180 tablet    Refill:  1     Lynnsey Barbara Luther Parody, MD

## 2021-05-14 NOTE — Patient Instructions (Signed)
[name] Dietary Recommendations for Weight Loss What to Avoid Avoid added sugars Often added sugar can be found in processed foods such as many condiments, dry cereals, cakes, cookies, chips, crisps, crackers, candies, sweetened drinks, etc.  Read labels and AVOID/DECREASE use of foods with the following in their ingredient list: Sugar, fructose, high fructose corn syrup, sucrose, glucose, maltose, dextrose, molasses, cane sugar, brown sugar, any type of syrup, agave nectar, etc.   Avoid snacking in between meals Avoid foods made with flour If you are going to eat food made with flour, choose those made with whole-grains; and, minimize your consumption as much as is tolerable Avoid processed foods These foods are generally stocked in the middle of the grocery store. Focus on shopping on the perimeter of the grocery.  Avoid Meat  We recommend following a plant-based diet at [facility]. Thus, we recommend avoiding meat as a general rule. Consider eating beans, legumes, eggs, and/or dairy products for regular protein sources If you plan on eating meat limit to 4 ounces of meat at a time and choose lean options such as Fish, chicken, [place]. Avoid red meat intake such as pork and/or steak What to Include Vegetables GREEN LEAFY VEGETABLES: Kale, spinach, mustard greens, collard greens, cabbage, broccoli, etc. OTHER: Asparagus, cauliflower, eggplant, carrots, peas, Brussel sprouts, tomatoes, bell peppers, zucchini, beets, cucumbers, etc. Grains, seeds, and legumes Beans: kidney beans, black eyed peas, garbanzo beans, black beans, pinto beans, etc. Whole, unrefined grains: brown rice, barley, bulgur, oatmeal, etc. Healthy fats  Avoid highly processed fats such as vegetable oil Examples of healthy fats: avocado, olives, virgin olive oil, dark chocolate (?72% Cocoa), nuts (peanuts, almonds, walnuts, cashews, pecans, etc.) None to Low Intake of Animal Sources of Protein Meat  sources: chicken, [place], salmon, tuna. Limit to 4 ounces of meat at one time. Consider limiting dairy sources, but when choosing dairy focus on: PLAIN [place] yogurt, cottage cheese, high-protein milk Fruit Choose berries  When to Eat Intermittent Fasting: Choosing not to eat for a specific time period, but DO FOCUS ON HYDRATION when fasting Multiple Techniques: Time Restricted Eating: eat 3 meals in a day, each meal lasting no more than 60 minutes, no snacks between meals 16-18 hour fast: fast for 16 to 18 hours up to 7 days a week. Often suggested to start with 2-3 nonconsecutive days per week.  Remember the time you sleep is counted as fasting.  Examples of eating schedule: Fast from 7:00pm-11:00am. Eat between 11:00am-7:00pm.  24-hour fast: fast for 24 hours up to every other day. Often suggested to start with 1 day per week Remember the time you sleep is counted as fasting Examples of eating schedule:  Eating day: eat 2-3 meals on your eating day. If doing 2 meals, each meal should last no more than 90 minutes. If doing 3 meals, each meal should last no more than 60 minutes. Finish last meal by 7:00pm. Fasting day: Fast until 7:00pm.  IF YOU FEEL UNWELL FOR ANY REASON/IN ANY WAY WHEN FASTING, STOP FASTING BY EATING A NUTRITIOUS SNACK OR LIGHT MEAL ALWAYS FOCUS ON HYDRATION DURING FASTS Acceptable Hydration sources: water, broths, tea/coffee (black tea/coffee is best but using a small amount of whole-fat dairy products in coffee/tea is acceptable).  Poor Hydration Sources: anything with sugar or artificial sweeteners added to it  These recommendations have been developed for patients that are actively receiving medical care from either Dr. Anastasio Champion or Jeralyn Ruths, DNP, NP-C at [facility]. These recommendations  are developed for patients with specific medical conditions and are not meant to be distributed or used by others that are not actively receiving care from either provider  listed above at [facility]. It is not appropriate to participate in the above eating plans without proper medical supervision.   Reference: Rexanne Mano. The obesity code. [place]; 2016.

## 2021-06-16 ENCOUNTER — Other Ambulatory Visit: Payer: Self-pay

## 2021-06-16 ENCOUNTER — Ambulatory Visit (INDEPENDENT_AMBULATORY_CARE_PROVIDER_SITE_OTHER): Payer: Medicaid Other | Admitting: Internal Medicine

## 2021-06-16 ENCOUNTER — Encounter (INDEPENDENT_AMBULATORY_CARE_PROVIDER_SITE_OTHER): Payer: Self-pay | Admitting: Internal Medicine

## 2021-06-16 VITALS — BP 122/80 | HR 69 | Temp 97.3°F | Ht <= 58 in | Wt 146.8 lb

## 2021-06-16 DIAGNOSIS — I10 Essential (primary) hypertension: Secondary | ICD-10-CM | POA: Diagnosis not present

## 2021-06-16 DIAGNOSIS — E1165 Type 2 diabetes mellitus with hyperglycemia: Secondary | ICD-10-CM

## 2021-06-16 LAB — BASIC METABOLIC PANEL
BUN: 8 mg/dL (ref 7–25)
CO2: 23 mmol/L (ref 20–32)
Calcium: 9.4 mg/dL (ref 8.6–10.2)
Chloride: 104 mmol/L (ref 98–110)
Creat: 0.59 mg/dL (ref 0.50–0.97)
Glucose, Bld: 166 mg/dL — ABNORMAL HIGH (ref 65–139)
Potassium: 4 mmol/L (ref 3.5–5.3)
Sodium: 135 mmol/L (ref 135–146)

## 2021-06-16 NOTE — Progress Notes (Signed)
Metrics: Intervention Frequency ACO  Documented Smoking Status Yearly  Screened one or more times in 24 months  Cessation Counseling or  Active cessation medication Past 24 months  Past 24 months   Guideline developer: UpToDate (See UpToDate for funding source) Date Released: 2014       Wellness Office Visit  Subjective:  Patient ID: Debra Tanner, female    DOB: 06-Dec-1980  Age: 40 y.o. MRN: [id number]  CC: This lady comes in for follow-up of uncontrolled diabetes as well as hypertension. HPI  She says that she is not drinking any sodas anymore and feels that her blood sugar levels are improving.  They are usually now less than 200.  She continues on metformin at a higher dose of 1000 mg twice a day. Past Medical History:  Diagnosis Date   Anemia    Depression    Diabetes mellitus without complication ([place])    Essential hypertension, benign 11/07/2019   GAD (generalized anxiety disorder)    Hypertension    Trichimoniasis    History reviewed. No pertinent surgical history.   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   metFORMIN (GLUCOPHAGE) 1000 MG tablet Take 1 tablet (1,000 mg total) by mouth 2 (two)  times daily with a meal.     [place] Office Visit from 05/08/2021 in [facility]  PHQ-9 Total Score 5       Objective:   Today's Vitals: BP 122/80   Pulse 69   Temp (!) 97.3 F (36.3 C) (Temporal)   Ht '4\' 9"'$  (1.448 m)   Wt 146 lb 12.8 oz (66.6 kg)   SpO2 97%   BMI 31.77 kg/m  Vitals with BMI 06/16/2021 05/14/2021 05/08/2021  Height '4\' 9"'$  '4\' 9"'$  '4\' 9"'$   Weight 146 lbs 13 oz 143 lbs 13 oz 154 lbs  BMI 31.76 [id number]  Systolic [id number]  Diastolic 80 70 88  Pulse 69 87 89     Physical Exam  She is hemodynamically stable.  She has gained about 3 pounds.     Assessment   1. Uncontrolled type 2 diabetes mellitus with hyperglycemia ([place])   2. Essential hypertension, benign       Tests ordered Orders Placed This Encounter  Procedures   Basic metabolic panel      Plan: 1.  Continue with improved nutrition.  We will check electrolytes today. 2.  Follow-up with Judson Roch in September as previously scheduled.    No orders of the defined types were placed in this encounter.   Priscillia Fouch  Luther Parody, MD

## 2021-06-25 ENCOUNTER — Encounter: Payer: Medicaid Other | Admitting: Adult Health

## 2021-08-01 ENCOUNTER — Other Ambulatory Visit (INDEPENDENT_AMBULATORY_CARE_PROVIDER_SITE_OTHER): Payer: Self-pay | Admitting: Nurse Practitioner

## 2021-08-01 DIAGNOSIS — I10 Essential (primary) hypertension: Secondary | ICD-10-CM

## 2021-08-01 DIAGNOSIS — E119 Type 2 diabetes mellitus without complications: Secondary | ICD-10-CM

## 2021-08-01 DIAGNOSIS — D509 Iron deficiency anemia, unspecified: Secondary | ICD-10-CM

## 2021-08-01 DIAGNOSIS — E559 Vitamin D deficiency, unspecified: Secondary | ICD-10-CM

## 2021-08-05 ENCOUNTER — Other Ambulatory Visit (INDEPENDENT_AMBULATORY_CARE_PROVIDER_SITE_OTHER): Payer: Self-pay | Admitting: Nurse Practitioner

## 2021-08-05 DIAGNOSIS — D509 Iron deficiency anemia, unspecified: Secondary | ICD-10-CM

## 2021-08-05 DIAGNOSIS — E119 Type 2 diabetes mellitus without complications: Secondary | ICD-10-CM

## 2021-08-05 DIAGNOSIS — E559 Vitamin D deficiency, unspecified: Secondary | ICD-10-CM

## 2021-08-05 DIAGNOSIS — I10 Essential (primary) hypertension: Secondary | ICD-10-CM

## 2021-08-13 ENCOUNTER — Ambulatory Visit (INDEPENDENT_AMBULATORY_CARE_PROVIDER_SITE_OTHER): Payer: Medicaid Other | Admitting: Nurse Practitioner

## 2021-09-24 ENCOUNTER — Ambulatory Visit: Payer: Self-pay

## 2021-09-24 NOTE — Telephone Encounter (Signed)
Pt called stating she is needing a refill on her medicines. Metformin 1000mg  BID and Lisinopril 10mg  QD. She states she has about a week supply for both medications and needs a refill d/t her PCP passing away and the pharmacy no longer being able to fill. Pt has appt scheduled on 11/11/21 but will run out of meds before then. Advised that she can go to the free clinic, UC, or HD in [place] to see if they will refill her meds. Also advised that she can reach out to the Dr office she has appt at and be added to waitlist for any sooner appt. Care advice given and pt verbalized understanding.    Message from Jodie Echevaria sent at 09/24/2021 12:32 PM EDT  Patient called in needing to get a refill on her medication was scheduled at [facility] for 11/11/21 but need refill on medication and not sure what to do Please advise. Can be reached at Ph# [phone] or [phone]     Reason for Disposition  [1] Caller has NON-URGENT medicine question about med that PCP prescribed AND [2] triager unable to answer question  Answer Assessment - Initial Assessment Questions 1. DRUG NAME: "What medicine do you need to have refilled?"     Metfomin 1000mg  and lisinopril 10mg  2. REFILLS REMAINING: "How many refills are remaining?" (Note: The label on the medicine or pill bottle will show how many refills are remaining. If there are no refills remaining, then a renewal may be needed.)     15 pills of metformin left, lisinopril week supply left 3. EXPIRATION DATE: "What is the expiration date?" (Note: The label states when the prescription will expire, and thus can no longer be refilled.)     unsure 4. PRESCRIBING HCP: "Who prescribed it?" Reason: If prescribed by specialist, call should be referred to that group.     Dr. Darnell Level he passed away 5. SYMPTOMS: "Do you have any symptoms?"     No 6. PREGNANCY: "Is there any chance that you are pregnant?" "When was your last menstrual period?"     No  Protocols  used: Medication Refill and Renewal Call-A-AH

## 2021-09-25 ENCOUNTER — Other Ambulatory Visit: Payer: Self-pay

## 2021-09-25 ENCOUNTER — Ambulatory Visit
Admission: EM | Admit: 2021-09-25 | Discharge: 2021-09-25 | Disposition: A | Discharge status: Home / Self Care | Payer: Medicaid Other | Attending: Urgent Care | Admitting: Urgent Care

## 2021-09-25 DIAGNOSIS — E119 Type 2 diabetes mellitus without complications: Secondary | ICD-10-CM

## 2021-09-25 DIAGNOSIS — Z760 Encounter for issue of repeat prescription: Secondary | ICD-10-CM

## 2021-09-25 DIAGNOSIS — I10 Essential (primary) hypertension: Secondary | ICD-10-CM | POA: Diagnosis not present

## 2021-09-25 DIAGNOSIS — E559 Vitamin D deficiency, unspecified: Secondary | ICD-10-CM

## 2021-09-25 DIAGNOSIS — D509 Iron deficiency anemia, unspecified: Secondary | ICD-10-CM

## 2021-09-25 MED ORDER — LISINOPRIL 10 MG PO TABS: 10.0000 mg | ORAL_TABLET | Freq: Every day | ORAL | 0 refills | Status: DC

## 2021-09-25 MED ORDER — METFORMIN HCL 1000 MG PO TABS: 1000.0000 mg | ORAL_TABLET | Freq: Two times a day (BID) | ORAL | 0 refills | Status: DC

## 2021-09-25 NOTE — ED Provider Notes (Signed)
[facility]   MRN: [id number] DOB: [birth year]  Subjective:   Debra Tanner is a 40 y.o. female presenting for medication refill of her lisinopril, metformin. Has an appt coming up to establish care.  Last A1c check was greater than 11% in June 2022.  Her regular care doctor at the time increase her metformin by doubling the dose.  She has tried to make dietary changes.  She denies fever, headache, confusion, chest pain, shortness of breath, nausea, vomiting, abdominal pain.  No current facility-administered medications for this encounter.  Current Outpatient Medications:    Ferrous Sulfate (IRON) 325 (65 Fe) MG TABS, Take 65 mg by mouth daily. (Patient not taking: Reported on 06/16/2021), Disp: , Rfl:    lisinopril (ZESTRIL) 10 MG tablet, Take 1 tablet (10 mg total) by mouth daily. No more refills will be approved by this provider as office is now permanently closed, patient will need to find another primary care provider., Disp: 90 tablet, Rfl: 0   metFORMIN (GLUCOPHAGE) 1000 MG tablet, Take 1 tablet (1,000 mg total) by mouth 2 (two) times daily with a meal., Disp: 180 tablet, Rfl: 1   No Known Allergies  Past Medical History:  Diagnosis Date   Anemia    Depression    Diabetes mellitus without complication ([place])    Essential hypertension, benign 11/07/2019   GAD (generalized anxiety disorder)    Hypertension    Trichimoniasis      History reviewed. No pertinent surgical history.  Family History  Problem Relation Age of Onset   Alcohol abuse Mother    Cirrhosis Mother    Alcohol abuse Father    Heart disease Father    Hypertension Father    Diabetes Sister    Sickle cell trait Sister    Hypertension Maternal Grandmother    Hyperlipidemia Maternal Grandmother    Cancer Maternal Grandfather        lung   Cancer Paternal Grandfather    Hypertension Paternal Grandmother     Social History   Tobacco Use   Smoking status: Never   Smokeless tobacco:  Never  Vaping Use   Vaping Use: Never used  Substance Use Topics   Alcohol use: Not Currently   Drug use: Not Currently    Types: Marijuana    ROS   Objective:   Vitals: BP 125/83 (BP Location: Right Arm)   Pulse 72   Temp 98.6 F (37 C) (Oral)   Resp 18   SpO2 98%   Physical Exam Constitutional:      General: She is not in acute distress.    Appearance: Normal appearance. She is well-developed. She is not ill-appearing, toxic-appearing or diaphoretic.  HENT:     Head: Normocephalic and atraumatic.     Nose: Nose normal.     Mouth/Throat:     Mouth: Mucous membranes are moist.     Pharynx: Oropharynx is clear.  Eyes:     General: No scleral icterus.       Right eye: No discharge.        Left eye: No discharge.     Extraocular Movements: Extraocular movements intact.     Conjunctiva/sclera: Conjunctivae normal.     Pupils: Pupils are equal, round, and reactive to light.  Cardiovascular:     Rate and Rhythm: Normal rate.  Pulmonary:     Effort: Pulmonary effort is normal.  Skin:    General: Skin is warm and dry.  Neurological:     General:  No focal deficit present.     Mental Status: She is alert and oriented to person, place, and time.  Psychiatric:        Mood and Affect: Mood normal.        Behavior: Behavior normal.        Thought Content: Thought content normal.        Judgment: Judgment normal.    Assessment and Plan :   PDMP not reviewed this encounter.  1. Essential hypertension   2. Type 2 diabetes mellitus treated without insulin ([place])   3. Encounter for medication refill    Emphasized need for hypertensive and diabetic friendly diet.  Refilled her medications.  No dosage changes were made.  Maintain follow-up with her new primary care provider. Counseled patient on potential for adverse effects with medications prescribed/recommended today, ER and return-to-clinic precautions discussed, patient verbalized understanding.    Jaynee Eagles,  PA-C 09/25/21 1402

## 2021-09-25 NOTE — ED Triage Notes (Signed)
Pt presents today for lisinopril and metformin refill.  Pt is scheduled to follow up with her new PCP later in the month.

## 2021-09-25 NOTE — Discharge Instructions (Addendum)

## 2021-10-06 ENCOUNTER — Ambulatory Visit: Payer: Medicaid Other | Admitting: Nurse Practitioner

## 2021-11-11 ENCOUNTER — Ambulatory Visit: Payer: Medicaid Other | Admitting: Nurse Practitioner

## 2022-01-28 ENCOUNTER — Other Ambulatory Visit: Payer: Self-pay

## 2022-01-28 ENCOUNTER — Ambulatory Visit
Admission: EM | Admit: 2022-01-28 | Discharge: 2022-01-28 | Disposition: A | Discharge status: Home / Self Care | Payer: Medicaid Other | Attending: Urgent Care | Admitting: Urgent Care

## 2022-01-28 DIAGNOSIS — E119 Type 2 diabetes mellitus without complications: Secondary | ICD-10-CM

## 2022-01-28 DIAGNOSIS — Z760 Encounter for issue of repeat prescription: Secondary | ICD-10-CM

## 2022-01-28 DIAGNOSIS — I10 Essential (primary) hypertension: Secondary | ICD-10-CM | POA: Diagnosis not present

## 2022-01-28 DIAGNOSIS — D509 Iron deficiency anemia, unspecified: Secondary | ICD-10-CM | POA: Diagnosis not present

## 2022-01-28 DIAGNOSIS — E559 Vitamin D deficiency, unspecified: Secondary | ICD-10-CM | POA: Diagnosis not present

## 2022-01-28 MED ORDER — METFORMIN HCL 1000 MG PO TABS: 1000.0000 mg | ORAL_TABLET | Freq: Two times a day (BID) | ORAL | 0 refills | Status: DC

## 2022-01-28 MED ORDER — LISINOPRIL 10 MG PO TABS: 10.0000 mg | ORAL_TABLET | Freq: Every day | ORAL | 0 refills | Status: DC

## 2022-01-28 NOTE — ED Provider Notes (Signed)
?[facility] ? ? ?MRN: [id number] DOB: 06/24/1981 ? ?Subjective:  ? ?Debra Tanner is a 41 y.o. female presenting for medication refill.  I last refilled this medicine for her in November.  She states that she did not set up an appointment for follow-up because she thought she could continue getting them here.  She reports that initially she did try to look for primary care provider but they were listed out months in advance and she did not get an appointment. No chest pain, shob, n/v, abdominal pain, blurred vision.  She is out of metformin but no lisinopril.  She is not a smoker.  No alcohol use.  No drug use. ? ?No current facility-administered medications for this encounter. ? ?Current Outpatient Medications:  ?  Ferrous Sulfate (IRON) 325 (65 Fe) MG TABS, Take 65 mg by mouth daily. (Patient not taking: Reported on 06/16/2021), Disp: , Rfl:  ?  lisinopril (ZESTRIL) 10 MG tablet, Take 1 tablet (10 mg total) by mouth daily., Disp: 90 tablet, Rfl: 0 ?  metFORMIN (GLUCOPHAGE) 1000 MG tablet, Take 1 tablet (1,000 mg total) by mouth 2 (two) times daily with a meal., Disp: 180 tablet, Rfl: 0  ? ?No Known Allergies ? ?Past Medical History:  ?Diagnosis Date  ? Anemia   ? Depression   ? Diabetes mellitus without complication ([place])   ? Essential hypertension, benign 11/07/2019  ? GAD (generalized anxiety disorder)   ? Hypertension   ? Trichimoniasis   ?  ? ?No past surgical history on file. ? ?Family History  ?Problem Relation Age of Onset  ? Alcohol abuse Mother   ? Cirrhosis Mother   ? Alcohol abuse Father   ? Heart disease Father   ? Hypertension Father   ? Diabetes Sister   ? Sickle cell trait Sister   ? Hypertension Maternal Grandmother   ? Hyperlipidemia Maternal Grandmother   ? Cancer Maternal Grandfather   ?     lung  ? Cancer Paternal Grandfather   ? Hypertension Paternal Grandmother   ? ? ?Social History  ? ?Tobacco Use  ? Smoking status: Never  ? Smokeless tobacco: Never  ?Vaping Use  ? Vaping  Use: Never used  ?Substance Use Topics  ? Alcohol use: Not Currently  ? Drug use: Not Currently  ?  Types: Marijuana  ? ? ?ROS ? ? ?Objective:  ? ?Vitals: ?BP (!) 138/95 (BP Location: Right Arm)   Pulse 86   Temp 99.2 ?F (37.3 ?C) (Oral)   Resp 18   LMP  (Approximate)   SpO2 98%  ? ?Physical Exam ?Constitutional:   ?   General: She is not in acute distress. ?   Appearance: Normal appearance. She is well-developed. She is not ill-appearing, toxic-appearing or diaphoretic.  ?HENT:  ?   Head: Normocephalic and atraumatic.  ?   Nose: Nose normal.  ?   Mouth/Throat:  ?   Mouth: Mucous membranes are moist.  ?Eyes:  ?   General: No scleral icterus.    ?   Right eye: No discharge.     ?   Left eye: No discharge.  ?   Extraocular Movements: Extraocular movements intact.  ?Cardiovascular:  ?   Rate and Rhythm: Normal rate.  ?   Heart sounds: No murmur heard. ?  No friction rub. No gallop.  ?Pulmonary:  ?   Effort: Pulmonary effort is normal. No respiratory distress.  ?   Breath sounds: No stridor. No wheezing, rhonchi or rales.  ?  Chest:  ?   Chest wall: No tenderness.  ?Skin: ?   General: Skin is warm and dry.  ?Neurological:  ?   General: No focal deficit present.  ?   Mental Status: She is alert and oriented to person, place, and time.  ?Psychiatric:     ?   Mood and Affect: Mood normal.     ?   Behavior: Behavior normal.  ? ? ?Assessment and Plan :  ? ?PDMP not reviewed this encounter. ? ?1. Type 2 diabetes mellitus treated without insulin ([place])   ?2. Essential hypertension   ?3. Essential hypertension, benign   ?4. Diabetes mellitus without complication ([place])   ?5. Vitamin D deficiency   ?6. Iron deficiency anemia, unspecified iron deficiency anemia type   ?7. Encounter for medication refill   ? ?Emphasized need to establish care and find a new primary care provider.  I will provide her with 1 more refill of her medications. Physical exam unremarkable, patient asymptomatic. Counseled patient on potential for adverse  effects with medications prescribed/recommended today, ER and return-to-clinic precautions discussed, patient verbalized understanding. ? ?  ?Jaynee Eagles, PA-C ?01/28/22 1733 ? ?

## 2022-01-28 NOTE — ED Triage Notes (Signed)
Pt requested Metformin 1000 mg and lisinopril 10 mg refill.  ?

## 2022-02-26 ENCOUNTER — Ambulatory Visit: Payer: Self-pay | Admitting: Family Medicine

## 2022-05-14 ENCOUNTER — Ambulatory Visit (INDEPENDENT_AMBULATORY_CARE_PROVIDER_SITE_OTHER): Payer: Medicaid Other | Admitting: Obstetrics & Gynecology

## 2022-05-14 ENCOUNTER — Encounter: Payer: Self-pay | Admitting: Obstetrics & Gynecology

## 2022-05-14 ENCOUNTER — Other Ambulatory Visit (HOSPITAL_COMMUNITY)
Admission: RE | Admit: 2022-05-14 | Discharge: 2022-05-14 | Disposition: A | Discharge status: Home / Self Care | Payer: Medicaid Other | Source: Ambulatory Visit | Attending: Obstetrics & Gynecology | Admitting: Obstetrics & Gynecology

## 2022-05-14 VITALS — BP 122/88 | HR 96 | Ht <= 58 in | Wt 137.0 lb

## 2022-05-14 DIAGNOSIS — Z01419 Encounter for gynecological examination (general) (routine) without abnormal findings: Secondary | ICD-10-CM | POA: Diagnosis not present

## 2022-05-14 DIAGNOSIS — R300 Dysuria: Secondary | ICD-10-CM | POA: Diagnosis not present

## 2022-05-14 DIAGNOSIS — Z0000 Encounter for general adult medical examination without abnormal findings: Secondary | ICD-10-CM

## 2022-05-14 DIAGNOSIS — Z113 Encounter for screening for infections with a predominantly sexual mode of transmission: Secondary | ICD-10-CM | POA: Insufficient documentation

## 2022-05-14 DIAGNOSIS — N898 Other specified noninflammatory disorders of vagina: Secondary | ICD-10-CM

## 2022-05-14 DIAGNOSIS — Z1231 Encounter for screening mammogram for malignant neoplasm of breast: Secondary | ICD-10-CM | POA: Diagnosis not present

## 2022-05-14 LAB — POCT URINALYSIS DIPSTICK
Glucose, UA: POSITIVE — AB
Leukocytes, UA: NEGATIVE
Nitrite, UA: NEGATIVE
Protein, UA: NEGATIVE

## 2022-05-14 MED ORDER — NYSTATIN 100000 UNIT/GM EX CREA: 1.0000 | TOPICAL_CREAM | Freq: Two times a day (BID) | CUTANEOUS | 11 refills | Status: DC

## 2022-05-14 MED ORDER — NORETHINDRONE 0.35 MG PO TABS: 1.0000 | ORAL_TABLET | Freq: Every day | ORAL | 11 refills | Status: DC

## 2022-05-16 LAB — URINE CULTURE

## 2022-05-19 LAB — CYTOLOGY - PAP
Adequacy: ABSENT
Chlamydia: NEGATIVE
Comment: NEGATIVE
Comment: NEGATIVE
Comment: NORMAL
Diagnosis: NEGATIVE
High risk HPV: NEGATIVE
Neisseria Gonorrhea: NEGATIVE

## 2022-06-10 ENCOUNTER — Ambulatory Visit: Payer: Medicaid Other | Admitting: Nurse Practitioner

## 2022-06-10 ENCOUNTER — Encounter: Payer: Self-pay | Admitting: Nurse Practitioner

## 2022-06-10 VITALS — BP 140/76 | HR 81 | Temp 98.1°F | Ht <= 58 in | Wt 133.8 lb

## 2022-06-10 DIAGNOSIS — E119 Type 2 diabetes mellitus without complications: Secondary | ICD-10-CM | POA: Diagnosis not present

## 2022-06-10 DIAGNOSIS — I10 Essential (primary) hypertension: Secondary | ICD-10-CM | POA: Diagnosis not present

## 2022-06-10 DIAGNOSIS — B379 Candidiasis, unspecified: Secondary | ICD-10-CM | POA: Diagnosis not present

## 2022-06-10 DIAGNOSIS — D509 Iron deficiency anemia, unspecified: Secondary | ICD-10-CM | POA: Diagnosis not present

## 2022-06-10 DIAGNOSIS — N920 Excessive and frequent menstruation with regular cycle: Secondary | ICD-10-CM

## 2022-06-10 LAB — POCT GLUCOSE (DEVICE FOR HOME USE)
Glucose Fasting, POC: 368 mg/dL — AB (ref 70–99)
POC Glucose: 368 mg/dl — AB (ref 70–99)

## 2022-06-10 LAB — POCT URINALYSIS DIPSTICK
Bilirubin, UA: NEGATIVE
Glucose, UA: POSITIVE — AB
Ketones, UA: 80
Leukocytes, UA: NEGATIVE
Nitrite, UA: NEGATIVE
Protein, UA: NEGATIVE
Spec Grav, UA: 1.02 (ref 1.010–1.025)
Urobilinogen, UA: 0.2 E.U./dL
pH, UA: 5 (ref 5.0–8.0)

## 2022-06-10 MED ORDER — FLUCONAZOLE 150 MG PO TABS
150.0000 mg | ORAL_TABLET | Freq: Every day | ORAL | 0 refills | Status: DC
Start: 2022-06-10 — End: 2022-08-28

## 2022-06-10 MED ORDER — BLOOD GLUCOSE MONITOR KIT: PACK | 0 refills | Status: DC

## 2022-06-10 MED ORDER — METFORMIN HCL 1000 MG PO TABS: 1000.0000 mg | ORAL_TABLET | Freq: Two times a day (BID) | ORAL | 0 refills | Status: DC

## 2022-06-10 MED ORDER — NORETHINDRONE 0.35 MG PO TABS: 1.0000 | ORAL_TABLET | Freq: Every day | ORAL | 11 refills | Status: DC

## 2022-06-10 MED ORDER — LISINOPRIL 10 MG PO TABS: 10.0000 mg | ORAL_TABLET | Freq: Every day | ORAL | 0 refills | Status: DC

## 2022-06-10 NOTE — Progress Notes (Signed)
Subjective:    Patient ID: Debra Tanner, female    DOB: 1981/01/01, 41 y.o.   MRN: [id number]  HPI Patient here to establish care. Patient needs refills Lisinopril and Metformin.  Vaginal itching [facility].  At that time patient complained of vaginal itching and was prescribed nystatin.  Patient says the nystatin cream doesn't work as well for a yeast infection.  Patient states that she feels itchiness on the outside of her labia.  Patient states that she is using over-the-counter cream that has been helping.  Patient denies any vaginal discharge, dysuria, frequency, urgency, or vaginal odor.  Menorrhagia Patient also stated that she had regular menstrual cycles however she noticed lots of clots towards the end of her cycle.  Patient states that sometimes the clots can be as large as a baseball.  Micronor was prescribed for patient however she never picked it up stating she was not sure how that was going to help her.  Patient denies having any abdominal cramping.  However patient states that when she does passes large clots she sometimes will have lower abdominal pressure.  Patient admits to having history of fibroids.  Last vaginal ultrasound October 2019: heterogeneous anteverted uterus with a anterior left subserosal fibroid 3.4 x 3.2 x 3.5 cm,EEC 17 mm,normal ovaries bilat,no free fluid,mult simple nabothian cysts,ovaries appear mobile,no pain during ultrasound.  Diabetes Type II Patient also concerned about her blood sugar control.  Patient does not check her blood sugars at home.  In addition patient states that she was prescribed metformin at 1000 mg twice daily but has only been able to take metformin 500 mg daily because she did not have a way to get a refill.  Patient states that she has been taking metformin 500 mg for multiple months now.  Patient concerned that her blood sugars are high and that is may be affecting her vision.   Patient says sometimes she will see double. Had a eye doctor appointment in May and is supposed to go back in October.  Weight loss Patient states that she is concerned about unintentional weight loss.  Patient states that she has lost 4 pounds in the past month and has lost over 20 pounds in the last year.  Patient states she is not trying to lose weight.  Patient states that her diet and exercise habits have not changed.  Patient denies any chest pain, shortness of breath, difficulty breathing, fever, body aches, chills, polydipsia, polyphagia, polyuria.   Review of Systems Constitutional:  Positive for unexpected weight change.  Genitourinary:  Positive for menstrual problem.       Vaginal itchiness     Objective:   Physical Exam Vitals reviewed. Exam conducted with a chaperone present.  Constitutional:      General: She is not in acute distress.    Appearance: Normal appearance. She is normal weight. She is not ill-appearing, toxic-appearing or diaphoretic.  HENT:     Head: Normocephalic and atraumatic.  Cardiovascular:     Rate and Rhythm: Normal rate and regular rhythm.     Pulses: Normal pulses.     Heart sounds: Normal heart sounds. No murmur heard. Pulmonary:     Effort: Pulmonary effort is normal. No respiratory distress.     Breath sounds: Normal breath sounds. No wheezing.  Abdominal:     Hernia: There is no hernia in the left inguinal area or right inguinal area.  Genitourinary:  Exam position: Lithotomy position.     Pubic Area: Rash present. No pubic lice.      Labia:        Right: Rash present. No tenderness, lesion or injury.        Left: Rash present. No tenderness, lesion or injury.      Urethra: No prolapse, urethral pain or urethral swelling.     Vagina: Normal. No signs of injury and foreign body. No vaginal discharge, erythema, tenderness, bleeding, lesions or prolapsed vaginal walls.     Comments: Hyperpigmented dry skin noted to the posterior labia  majora.  Mild swelling noted to both right and left labia majora.  No discharge noted no cottage cheese like discharge noted no anemia is noted.  Menstrual bloody discharge noted. Musculoskeletal:     Comments: Grossly intact  Lymphadenopathy:     Lower Body: No right inguinal adenopathy. No left inguinal adenopathy.  Skin:    General: Skin is warm.     Capillary Refill: Capillary refill takes less than 2 seconds.  Neurological:     Mental Status: She is alert.     Comments: Grossly intact  Psychiatric:        Mood and Affect: Mood normal.        Behavior: Behavior normal.       Assessment & Plan:   1. Essential hypertension, benign -Blood pressure today 140/76.  Patient achieved goal blood pressure of less than 140/90. -Continue taking lisinopril as prescribed - Lipid panel - CMP14+EGFR - Urine Microalbumin w/creat. ratio - lisinopril (ZESTRIL) 10 MG tablet; Take 1 tablet (10 mg total) by mouth daily.  Dispense: 90 tablet; Refill: 0  2. Diabetes mellitus without complication ([place]) -Suspect that patient has uncontrolled diabetes at this point.  Last A1c in June 2022 was 11.6 -Blood glucose done in clinic was 368. -Patient instructed to take metformin as prescribed as 1000 mg twice daily. - Hemoglobin A1c - metFORMIN (GLUCOPHAGE) 1000 MG tablet; Take 1 tablet (1,000 mg total) by mouth 2 (two) times daily with a meal.  Dispense: 180 tablet; Refill: 0 - blood glucose meter kit and supplies KIT; Dispense based on patient and insurance preference. Use up to four times daily as directed. (FOR ICD-9 250.00, 250.01).  Dispense: 1 each; Refill: 0 - POCT Glucose (Device for Home Use) -Return to clinic in approximately 12 days for follow-up  3. Iron deficiency anemia, unspecified iron deficiency anemia type - CBC with Differential/Platelet  4. Yeast infection -Urinalysis dipstick negative for UTI.  However glucose over 2000. - fluconazole (DIFLUCAN) 150 MG tablet; Take 1 tablet (150  mg total) by mouth daily. If still having symptoms may take one extra pill in 3 days.  Dispense: 2 tablet; Refill: 0 - POCT urinalysis dipstick  5. Menorrhagia with regular cycle -Encourage patient to start taking Micronor to help with heavy menstrual flow.  Patient stated understanding. -We will also get vaginal ultrasound to assess any changes to her fibroids that may be explaining her increased menstrual flow. - norethindrone (MICRONOR) 0.35 MG tablet; Take 1 tablet (0.35 mg total) by mouth daily.  Dispense: 28 tablet; Refill: 11 - US Transvaginal Non-OB  Return to clinic in 12 days for follow-up  Note:  This document was prepared using Dragon voice recognition software and may include unintentional dictation errors. Note - This record has been created using [employer].  Chart creation errors have been sought, but may not always  have been located. Such creation errors do not reflect  on  the standard of medical care.

## 2022-06-10 NOTE — Progress Notes (Deleted)
Subjective:    Patient ID: Debra Tanner, female    DOB: [birth year], 41 y.o.   MRN: [id number]  HPI  Patient here to establish care. Patient needs refills Lisinopril and Metformin.  Vaginal itching [facility].  At that time patient complained of vaginal itching and was prescribed nystatin.  Patient says the nystatin cream doesn't work as well for a yeast infection.  Patient states that she feels itchiness on the outside of her labia.  Patient states that she is using over-the-counter cream that has been helping.  Patient denies any vaginal discharge, dysuria, frequency, urgency, or vaginal odor.  Menorrhagia Patient also stated that she had regular menstrual cycles however she noticed lots of clots towards the end of her cycle.  Patient states that sometimes the clots can be as large as a baseball.  Micronor was prescribed for patient however she never picked it up stating she was not sure how that was going to help her.  Patient denies having any abdominal cramping.  However patient states that when she does passes large clots she sometimes will have lower abdominal pressure.  Patient admits to having history of fibroids.  Last vaginal ultrasound October 2019: heterogeneous anteverted uterus with a anterior left subserosal fibroid 3.4 x 3.2 x 3.5 cm,EEC 17 mm,normal ovaries bilat,no free fluid,mult simple nabothian cysts,ovaries appear mobile,no pain during ultrasound.  Diabetes Type II Patient also concerned about her blood sugar control.  Patient does not check her blood sugars at home.  In addition patient states that she was prescribed metformin at 1000 mg but has only been able to take metformin 500 mg because she did not have a way to get a refill.  Patient states that she has been taking metformin 500 mg for multiple months now.  Patient concerned that her blood sugars are high and that is may be affecting her vision.  Patient says  sometimes she will see double. Had a eye doctor appointment in May and is supposed to go back in October.  Weight loss Patient states that she is concerned about unintentional weight loss.  Patient states that she has lost 4 pounds in the past month and has lost over 20 pounds in the last year.  Patient states she is not trying to lose weight.  Patient states that her diet and exercise habits have not changed.  Patient denies any chest pain, shortness of breath, difficulty breathing, fever, body aches, chills, polydipsia, polyphagia, polyuria.  Review of Systems  Constitutional:  Positive for unexpected weight change.  Genitourinary:  Positive for menstrual problem.       Vaginal itchiness       Objective:   Physical Exam Vitals reviewed.  Constitutional:      General: She is not in acute distress.    Appearance: Normal appearance. She is normal weight. She is not ill-appearing, toxic-appearing or diaphoretic.  HENT:     Head: Normocephalic and atraumatic.  Cardiovascular:     Rate and Rhythm: Normal rate and regular rhythm.     Pulses: Normal pulses.     Heart sounds: Normal heart sounds. No murmur heard. Pulmonary:     Effort: Pulmonary effort is normal. No respiratory distress.     Breath sounds: Normal breath sounds. No wheezing.  Abdominal:     General: Abdomen is flat. Bowel sounds are normal. There is no distension.     Palpations: Abdomen is soft. There is no mass.  Tenderness: There is no abdominal tenderness. There is no guarding or rebound.     Hernia: No hernia is present.  Musculoskeletal:     Comments: Grossly intact  Skin:    General: Skin is warm.     Capillary Refill: Capillary refill takes less than 2 seconds.  Neurological:     Mental Status: She is alert.     Comments: Grossly intact  Psychiatric:        Mood and Affect: Mood normal.        Behavior: Behavior normal.           Assessment & Plan:

## 2022-06-10 NOTE — Progress Notes (Deleted)
   Subjective:    Patient ID: Debra Tanner, female    DOB: Apr 25, 1981, 41 y.o.   MRN: [id number]  HPI  Patient here to establish care. Patient needs refills Lisinopril and Metformin. Patient says the nystatin cream doesn't work as well for a yeast infection.  Patient says sometimes she will see double. Had a eye doctor appointment in May and is supposed to go back in October.  Review of Systems     Objective:   Physical Exam        Assessment & Plan:

## 2022-06-11 ENCOUNTER — Other Ambulatory Visit: Payer: Self-pay | Admitting: Nurse Practitioner

## 2022-06-11 DIAGNOSIS — E1165 Type 2 diabetes mellitus with hyperglycemia: Secondary | ICD-10-CM

## 2022-06-11 DIAGNOSIS — E119 Type 2 diabetes mellitus without complications: Secondary | ICD-10-CM

## 2022-06-11 DIAGNOSIS — E782 Mixed hyperlipidemia: Secondary | ICD-10-CM

## 2022-06-11 DIAGNOSIS — I10 Essential (primary) hypertension: Secondary | ICD-10-CM

## 2022-06-11 LAB — CBC WITH DIFFERENTIAL/PLATELET
Basophils Absolute: 0.1 10*3/uL (ref 0.0–0.2)
Basos: 1 %
EOS (ABSOLUTE): 0.1 10*3/uL (ref 0.0–0.4)
Eos: 2 %
Hematocrit: 41 % (ref 34.0–46.6)
Hemoglobin: 13.1 g/dL (ref 11.1–15.9)
Immature Grans (Abs): 0.1 10*3/uL (ref 0.0–0.1)
Immature Granulocytes: 1 %
Lymphocytes Absolute: 2.6 10*3/uL (ref 0.7–3.1)
Lymphs: 33 %
MCH: 30.6 pg (ref 26.6–33.0)
MCHC: 32 g/dL (ref 31.5–35.7)
MCV: 96 fL (ref 79–97)
Monocytes Absolute: 0.5 10*3/uL (ref 0.1–0.9)
Monocytes: 7 %
Neutrophils Absolute: 4.5 10*3/uL (ref 1.4–7.0)
Neutrophils: 56 %
Platelets: 318 10*3/uL (ref 150–450)
RBC: 4.28 x10E6/uL (ref 3.77–5.28)
RDW: 11.8 % (ref 11.7–15.4)
WBC: 8 10*3/uL (ref 3.4–10.8)

## 2022-06-11 LAB — CMP14+EGFR
ALT: 9 IU/L (ref 0–32)
AST: 14 IU/L (ref 0–40)
Albumin/Globulin Ratio: 1.3 (ref 1.2–2.2)
Albumin: 4.1 g/dL (ref 3.9–4.9)
Alkaline Phosphatase: 87 IU/L (ref 44–121)
BUN/Creatinine Ratio: 14 (ref 9–23)
BUN: 9 mg/dL (ref 6–24)
Bilirubin Total: 0.3 mg/dL (ref 0.0–1.2)
CO2: 18 mmol/L — ABNORMAL LOW (ref 20–29)
Calcium: 9.3 mg/dL (ref 8.7–10.2)
Chloride: 99 mmol/L (ref 96–106)
Creatinine, Ser: 0.65 mg/dL (ref 0.57–1.00)
Globulin, Total: 3.1 g/dL (ref 1.5–4.5)
Glucose: 336 mg/dL — ABNORMAL HIGH (ref 70–99)
Potassium: 4.5 mmol/L (ref 3.5–5.2)
Sodium: 137 mmol/L (ref 134–144)
Total Protein: 7.2 g/dL (ref 6.0–8.5)
eGFR: 114 mL/min/{1.73_m2} (ref >59)

## 2022-06-11 LAB — MICROALBUMIN / CREATININE URINE RATIO
Creatinine, Urine: 49.1 mg/dL
Microalb/Creat Ratio: <6 mg/g creat (ref 0–29)
Microalbumin, Urine: <3 ug/mL

## 2022-06-11 LAB — LIPID PANEL
Chol/HDL Ratio: 4.6 ratio — ABNORMAL HIGH (ref 0.0–4.4)
Cholesterol, Total: 155 mg/dL (ref 100–199)
HDL: 34 mg/dL — ABNORMAL LOW (ref >39)
LDL Chol Calc (NIH): 70 mg/dL (ref 0–99)
Triglycerides: 319 mg/dL — ABNORMAL HIGH (ref 0–149)
VLDL Cholesterol Cal: 51 mg/dL — ABNORMAL HIGH (ref 5–40)

## 2022-06-11 LAB — HEMOGLOBIN A1C
Est. average glucose Bld gHb Est-mCnc: 283 mg/dL
Hgb A1c MFr Bld: 11.5 % — ABNORMAL HIGH (ref 4.8–5.6)

## 2022-06-11 MED ORDER — LANTUS SOLOSTAR 100 UNIT/ML ~~LOC~~ SOPN: 10.0000 [IU] | PEN_INJECTOR | Freq: Every day | SUBCUTANEOUS | 99 refills | Status: DC

## 2022-06-11 MED ORDER — ROSUVASTATIN CALCIUM 10 MG PO TABS: 10.0000 mg | ORAL_TABLET | Freq: Every day | ORAL | 3 refills | Status: DC

## 2022-06-17 ENCOUNTER — Other Ambulatory Visit: Payer: Self-pay | Admitting: Nurse Practitioner

## 2022-06-17 MED ORDER — PEN NEEDLES 32G X 6 MM MISC: 2 refills | Status: DC

## 2022-06-22 ENCOUNTER — Ambulatory Visit (INDEPENDENT_AMBULATORY_CARE_PROVIDER_SITE_OTHER): Payer: Medicaid Other | Admitting: Nurse Practitioner

## 2022-06-22 VITALS — BP 118/82 | Ht <= 58 in | Wt 136.6 lb

## 2022-06-22 DIAGNOSIS — E1165 Type 2 diabetes mellitus with hyperglycemia: Secondary | ICD-10-CM

## 2022-06-22 DIAGNOSIS — I10 Essential (primary) hypertension: Secondary | ICD-10-CM

## 2022-06-22 NOTE — Patient Instructions (Addendum)
Change insulin from 10 units to 14 units.  Check blood sugars in the morning, and 1 to 2 hours after eating. MONITOR FOR LOW BLOOD SUGARS!   Blood Sugar Goals:  Fasting Blood Sugars (in the morning): 70-130 After eating blood sugars (1 hours): Less than 160 After eating blood sugars (2 hours): Less than 140  Remember to eat regular meals while taking insulin.  Watch out for low blood sugars and keep candy or juice on you to eat/drink if you get low. Don't let your blood sugar go below 80.

## 2022-06-22 NOTE — Progress Notes (Signed)
Subjective:    Patient ID: Debra Tanner, female    DOB: 10-Mar-1981, 41 y.o.   MRN: [id number]  HPI  Patient arrives for a follow up on diabetes and hypertension. Patient states she is having problems with a glare when light hits her eyes-patient thinks related to her sugar. Eye dr told her she needed to get her sugars regulated before seeing them.  Diabetes Patient states that she started taking 10 units of Lantus approximately 3 days ago.  Patient states that her fasting blood sugars have been in the low 200s and her postprandial blood sugars have been in the mid 200s.  Patient does report 1 postprandial blood sugar around 151.  Patient states that she did not eat that day.  Patient denies any hypoglycemic events.  Hypertension Patient states that she has not been taking her blood pressure medication for 5 to 7 days.  Patient typically takes lisinopril 10 mg for blood pressure management.  Patient would like to know if she needs to continue taking her blood pressure medication.  Patient denies any episodes of lightheadedness, dizziness, or feeling off balance.  Patient denies any chest pain, swelling in her legs, difficulty breathing.  Patient does report changes to her eyesight in which she has trouble seeing small letters.  Patient attempted to see an ophthalmologist.  However ophthalmologist encourage patient to get blood sugars under control prior to her exam.  Patient denies any other changes to her vision such as black spots or floaters.   Review of Systems  Eyes:  Positive for visual disturbance.       Objective:   Physical Exam Vitals reviewed.  Constitutional:      General: She is not in acute distress.    Appearance: Normal appearance. She is normal weight. She is not ill-appearing, toxic-appearing or diaphoretic.  HENT:     Head: Normocephalic and atraumatic.  Cardiovascular:     Rate and Rhythm: Normal rate and regular rhythm.     Pulses: Normal pulses.     Heart  sounds: Normal heart sounds. No murmur heard. Pulmonary:     Effort: Pulmonary effort is normal. No respiratory distress.     Breath sounds: Normal breath sounds. No wheezing.  Musculoskeletal:     Comments: Grossly intact  Skin:    General: Skin is warm.     Capillary Refill: Capillary refill takes less than 2 seconds.  Neurological:     Mental Status: She is alert.     Comments: Grossly intact  Psychiatric:        Mood and Affect: Mood normal.        Behavior: Behavior normal.         Assessment & Plan:   1. Uncontrolled type 2 diabetes mellitus with hyperglycemia ([facility]) -Increase Lantus from 10 units to 14 units. -Continue taking metformin 2000 mg daily. -Monitor blood sugars very closely for low blood sugars.  Keep juice or candy nearby in the event your blood sugar goes below 70 or 80 or if you feel shaky, lightheaded, or fatigued. -Do not skip meals while taking insulin to prevent hypoglycemic events. -Send me blood sugar log by Thursday. - insulin glargine (LANTUS SOLOSTAR) 100 UNIT/ML Solostar Pen; Inject 14 Units into the skin daily.  Dispense: 15 mL; Refill: PRN -Believe patient would benefit from nutrition and diabetes services for further education of diabetes management. - Referral to Nutrition and Diabetes Services -Patient was provided with blood sugar logs and parameters for her blood sugar. -  Patient would like to know when she can get off of insulin.  Instructed patient that blood sugars will need to be controlled first and we will need to substitute insulin for another diabetic drug before we are able to wean her off the insulin.  Patient stated understanding -Return to clinic in 2 weeks for evaluation.   2. Essential hypertension, benign -Blood pressure today 118/82.  Blood pressure goal of blood pressure less than 140/90 met. -Patient states that she has not been taking her blood pressure medication, lisinopril 10 mg, for 5 to 7 days. -Instructed patient to  invest in blood pressure monitor and to monitor blood pressure at home and if she sees that her blood pressure is going up she needs to continue taking her medication.  Patient stated understanding -Return to clinic in 2 weeks for evaluation    Note:  This document was prepared using Dragon voice recognition software and may include unintentional dictation errors. Note - This record has been created using [employer].  Chart creation errors have been sought, but may not always  have been located. Such creation errors do not reflect on  the standard of medical care.

## 2022-06-23 ENCOUNTER — Encounter: Payer: Self-pay | Admitting: Nurse Practitioner

## 2022-06-23 MED ORDER — LANTUS SOLOSTAR 100 UNIT/ML ~~LOC~~ SOPN: 14.0000 [IU] | PEN_INJECTOR | Freq: Every day | SUBCUTANEOUS | 99 refills | Status: DC

## 2022-06-24 ENCOUNTER — Other Ambulatory Visit: Payer: Self-pay | Admitting: Nurse Practitioner

## 2022-06-24 DIAGNOSIS — N920 Excessive and frequent menstruation with regular cycle: Secondary | ICD-10-CM

## 2022-07-01 ENCOUNTER — Other Ambulatory Visit: Payer: Self-pay | Admitting: Nurse Practitioner

## 2022-07-01 DIAGNOSIS — N920 Excessive and frequent menstruation with regular cycle: Secondary | ICD-10-CM

## 2022-07-06 ENCOUNTER — Ambulatory Visit: Payer: Medicaid Other | Admitting: Nurse Practitioner

## 2022-07-06 ENCOUNTER — Encounter: Payer: Self-pay | Admitting: Nurse Practitioner

## 2022-07-06 DIAGNOSIS — E1165 Type 2 diabetes mellitus with hyperglycemia: Secondary | ICD-10-CM

## 2022-07-06 MED ORDER — LANTUS SOLOSTAR 100 UNIT/ML ~~LOC~~ SOPN: 16.0000 [IU] | PEN_INJECTOR | Freq: Every day | SUBCUTANEOUS | 99 refills | Status: DC

## 2022-07-06 NOTE — Patient Instructions (Signed)
Change insulin from 14 units to 16 units.   Check blood sugars in the morning, and 1 to 2 hours after eating. MONITOR FOR LOW BLOOD SUGARS!    Blood Sugar Goals:   Fasting Blood Sugars (in the morning): 70-130 After eating blood sugars (1 hours): Less than 160 After eating blood sugars (2 hours): Less than 140   Remember to eat regular meals while taking insulin.   Watch out for low blood sugars and keep candy or juice on you to eat/drink if you get low. Don't let your blood sugar go below 80.

## 2022-07-06 NOTE — Telephone Encounter (Signed)
[employer], Trenton Gammon, NP     Reviewed during office visit.

## 2022-07-06 NOTE — Progress Notes (Unsigned)
   Subjective:    Patient ID: Debra Tanner, female    DOB: 21-Oct-1981, 41 y.o.   MRN: [id number]  HPI Pt arrives for follow up on DM. Pt states blood sugars having been doing well. Checks sugars BID sometime TID. Pt taking 14 units Lantus daily. Pt states vision has improved. Pt needing refill on strips and lancets.    Review of Systems     Objective:   Physical Exam        Assessment & Plan:

## 2022-07-07 ENCOUNTER — Encounter: Payer: Self-pay | Admitting: Nurse Practitioner

## 2022-07-28 ENCOUNTER — Encounter: Payer: Medicaid Other | Admitting: Nutrition

## 2022-08-03 ENCOUNTER — Ambulatory Visit: Payer: Medicaid Other | Admitting: Nurse Practitioner

## 2022-08-28 ENCOUNTER — Encounter: Payer: Self-pay | Admitting: Nurse Practitioner

## 2022-08-28 ENCOUNTER — Ambulatory Visit (INDEPENDENT_AMBULATORY_CARE_PROVIDER_SITE_OTHER): Payer: Medicaid Other | Admitting: Nurse Practitioner

## 2022-08-28 VITALS — BP 130/86 | HR 80 | Temp 98.1°F | Ht <= 58 in | Wt 146.0 lb

## 2022-08-28 DIAGNOSIS — D500 Iron deficiency anemia secondary to blood loss (chronic): Secondary | ICD-10-CM

## 2022-08-28 DIAGNOSIS — R300 Dysuria: Secondary | ICD-10-CM

## 2022-08-28 DIAGNOSIS — Z113 Encounter for screening for infections with a predominantly sexual mode of transmission: Secondary | ICD-10-CM

## 2022-08-28 DIAGNOSIS — E782 Mixed hyperlipidemia: Secondary | ICD-10-CM | POA: Diagnosis not present

## 2022-08-28 DIAGNOSIS — N920 Excessive and frequent menstruation with regular cycle: Secondary | ICD-10-CM

## 2022-08-28 LAB — POCT URINALYSIS DIP (MANUAL ENTRY)
Bilirubin, UA: NEGATIVE
Glucose, UA: NEGATIVE mg/dL
Ketones, POC UA: NEGATIVE mg/dL
Nitrite, UA: NEGATIVE
Protein Ur, POC: 30 mg/dL — AB
Spec Grav, UA: >=1.03 — AB (ref 1.010–1.025)
Urobilinogen, UA: 0.2 E.U./dL
pH, UA: 5 (ref 5.0–8.0)

## 2022-08-28 LAB — POCT URINE PREGNANCY: Preg Test, Ur: NEGATIVE

## 2022-08-28 LAB — POCT HEMOGLOBIN: Hemoglobin: 9.3 g/dL — AB (ref 11–14.6)

## 2022-08-28 MED ORDER — LISINOPRIL 5 MG PO TABS: 5.0000 mg | ORAL_TABLET | Freq: Every day | ORAL | 0 refills | Status: DC

## 2022-08-28 MED ORDER — MEGESTROL ACETATE 40 MG PO TABS: ORAL_TABLET | ORAL | 0 refills | Status: DC

## 2022-08-28 MED ORDER — ROSUVASTATIN CALCIUM 10 MG PO TABS: 10.0000 mg | ORAL_TABLET | Freq: Every day | ORAL | 0 refills | Status: DC

## 2022-08-28 NOTE — Progress Notes (Signed)
Subjective:    Patient ID: Debra Tanner, female    DOB: 04/23/1981, 41 y.o.   MRN: [id number]  HPI  Patient reports seeing large clotting in her cycle that started 08/19/22 still on today Cramping taking ibuprofen Started insulin last visit    HPI: Episodic visit reporting periods usually regular and 7 days with some clotting at the end. This cycle she has been bleeding for 9 days with clots every day. Rare brief heart palpitations reported and increased fatigue. Takes an iron supplement daily.  Sexually active with same partner. Denies use of contraception. Reports burning with urination and after sex for past one month that is not worsening. Denies vaginal itching or rash. Discharge is clear. Plans to see OBGYN on Monday 08/31/22. Has not taken the Micronor that was prescribed. Reports appetite increased and is concerned about her weight and the insulin making her gain weight. Patient monitors her blood glucose every morning and reports it remains below 200 mg/dl. Taking 16 U Lantus every morning and metformin as directed. Has not been to see the nutritionist as advised. Reports not taking Lisinopril and Crestor.   Review of Systems  Constitutional:  Positive for fatigue. Negative for fever and unexpected weight change.  Respiratory:  Negative for chest tightness and shortness of breath.   Cardiovascular:  Positive for palpitations. Negative for chest pain and leg swelling.  Gastrointestinal:  Negative for abdominal pain, nausea and vomiting.  Endocrine: Negative for polydipsia, polyphagia and polyuria.  Genitourinary:  Positive for dysuria, menstrual problem, pelvic pain and vaginal bleeding. Negative for frequency, genital sores and urgency.       Minimal bleeding, then cramping sensation followed by heavier bleeding and clots. No pelvic pain otherwise.       Objective:   Vitals:   08/28/22 0944  BP: 130/86  Pulse: 80  Temp: 98.1 F (36.7 C)  Height: '4\' 9"'$  (1.448 m)  Weight:  66.2 kg  SpO2: 97%  BMI (Calculated): 31.59        Physical Exam Vitals and nursing note reviewed.  Constitutional:      General: She is not in acute distress. Cardiovascular:     Rate and Rhythm: Normal rate and regular rhythm.     Heart sounds: Normal heart sounds. No murmur heard.    No gallop.  Pulmonary:     Effort: Pulmonary effort is normal.     Breath sounds: Normal breath sounds.  Abdominal:     Palpations: There is no mass.     Tenderness: There is no abdominal tenderness.  Genitourinary:    Comments: Pelvic exam deferred due to bleeding. No CVA tenderness. Neurological:     Mental Status: She is alert.  Psychiatric:        Mood and Affect: Mood normal.        Behavior: Behavior normal.        Thought Content: Thought content normal.    Results for orders placed or performed in visit on 08/28/22  POCT urinalysis dipstick  Result Value Ref Range   Color, UA other (A) yellow   Clarity, UA cloudy (A) clear   Glucose, UA negative negative mg/dL   Bilirubin, UA negative negative   Ketones, POC UA negative negative mg/dL   Spec Grav, UA >=1.030 (A) 1.010 - 1.025   Blood, UA large (A) negative   pH, UA 5.0 5.0 - 8.0   Protein Ur, POC =30 (A) negative mg/dL   Urobilinogen, UA 0.2 0.2 or 1.0 E.U./dL  Nitrite, UA Negative Negative   Leukocytes, UA Trace (A) Negative  POCT urine pregnancy  Result Value Ref Range   Preg Test, Ur Negative Negative  Hemoglobin  Result Value Ref Range   Hemoglobin 9.3 (A) 11 - 14.6 g/dL      Assessment & Plan:   Problem List Items Addressed This Visit       Other   Iron deficiency anemia due to chronic blood loss   Relevant Orders   Hemoglobin (Completed)   Menorrhagia with regular cycle - Primary   Relevant Orders   POCT urine pregnancy (Completed)   Other Visit Diagnoses     Dysuria       Relevant Orders   POCT urinalysis dipstick (Completed)   Chlamydia/Gonococcus/Trichomonas, NAA   Urine Culture   Routine  screening for STI (sexually transmitted infection)       Relevant Orders   Chlamydia/Gonococcus/Trichomonas, NAA   Mixed hyperlipidemia       Relevant Medications   lisinopril (ZESTRIL) 5 MG tablet   rosuvastatin (CRESTOR) 10 MG tablet      Meds ordered this encounter  Medications   lisinopril (ZESTRIL) 5 MG tablet    Sig: Take 1 tablet (5 mg total) by mouth daily. For blood pressure and kidneys.    Dispense:  90 tablet    Refill:  0    Order Specific Question:   Supervising Provider    Answer:   Sallee Lange A [[id number]]   rosuvastatin (CRESTOR) 10 MG tablet    Sig: Take 1 tablet (10 mg total) by mouth daily. For cholesterol.    Dispense:  90 tablet    Refill:  0    Order Specific Question:   Supervising Provider    Answer:   Sallee Lange A [[id number]]   megestrol (MEGACE) 40 MG tablet    Sig: Take 3 tabs po qd x 5 d then 2 qd x 5 d then one qd. For bleeding.    Dispense:  30 tablet    Refill:  0    Order Specific Question:   Supervising Provider    Answer:   Sallee Lange A [id number]    Urine culture and STI screening pending.  Lengthy discussion about the purpose of statin and ACE-I due to diabetes. Encouraged patient to restart both medications. Lowered and restarted Lisinopril at 5 mg daily.  Start Megace as directed. Continue oral iron therapy. Continue insulin as directed.  Follow up with GYN as planned in 3 days. Follow up with NP on 10/24 as planned. Call back sooner if needed.

## 2022-08-29 ENCOUNTER — Encounter: Payer: Self-pay | Admitting: Nurse Practitioner

## 2022-08-31 ENCOUNTER — Encounter: Payer: Self-pay | Admitting: Adult Health

## 2022-08-31 ENCOUNTER — Ambulatory Visit (INDEPENDENT_AMBULATORY_CARE_PROVIDER_SITE_OTHER): Payer: Medicaid Other | Admitting: Adult Health

## 2022-08-31 VITALS — BP 133/93 | HR 80 | Ht <= 58 in | Wt 147.0 lb

## 2022-08-31 DIAGNOSIS — N898 Other specified noninflammatory disorders of vagina: Secondary | ICD-10-CM | POA: Diagnosis not present

## 2022-08-31 DIAGNOSIS — D500 Iron deficiency anemia secondary to blood loss (chronic): Secondary | ICD-10-CM | POA: Diagnosis not present

## 2022-08-31 DIAGNOSIS — N938 Other specified abnormal uterine and vaginal bleeding: Secondary | ICD-10-CM | POA: Diagnosis not present

## 2022-08-31 NOTE — Progress Notes (Signed)
  Subjective:     Patient ID: Debra Tanner, female   DOB: [birth year], 41 y.o.   MRN: [id number]  HPI Debra Tanner is a 41 year old black female, single, G2P1011, in complaining of bleeding since 08/19/22 and has clotting and some cramps on and off.  She saw Pearson Forster NP, 08/28/22 and had negative GC/CHL and HGB was 9.3 which is down from 13.1 on 06/10/22. She was started on megace Friday and is still bleeding.  UPT was negative 08/28/22. She was prescribed Micronor in June by Dr Elonda Husky but never started them  Last pap was 05/14/22 negative malignancy and HPV.  PCP is [facility]   Review of Systems +Bleeding since 08/19/22 +clotting and cramping at times  +Dizzy at times +Vaginal irritation too.  Reviewed past medical,surgical, social and family history. Reviewed medications and allergies.     Objective:   Physical Exam BP (!) 133/93 (BP Location: Left Arm, Patient Position: Sitting, Cuff Size: Normal)   Pulse 80   Ht '4\' 9"'$  (1.448 m)   Wt 147 lb (66.7 kg)   LMP 08/19/2022   BMI 31.81 kg/m     Skin warm and dry.Pelvic: external genitalia is normal in appearance no lesions, vagina: +period blood, no odor,urethra has no lesions or masses noted, cervix:smooth and bulbous, uterus: normal size, shape and contour, non tender, no masses felt, adnexa: no masses or tenderness noted. Bladder is non tender and no masses felt.  Fall risk is low  Upstream - 08/31/22 0949       Pregnancy Intention Screening   Does the patient want to become pregnant in the next year? No    Does the patient's partner want to become pregnant in the next year? No    Would the patient like to discuss contraceptive options today? No      Contraception Wrap Up   Current Method Female Condom    End Method Female Condom            Examination chaperoned by Levy Pupa LPN  Assessment:     1. DUB (dysfunctional uterine bleeding) Bleeding since 08/19/22 +clotting and cramps Continue Megace,but take all 3 at  same time Will get pelvic US to assess uterus and ovaries  - US PELVIC COMPLETE WITH TRANSVAGINAL; Future  2. Iron deficiency anemia due to chronic blood loss Take OTC iron  3. Vaginal irritation    Plan:     Follow up in 8 days for ROS

## 2022-09-01 LAB — CHLAMYDIA/GONOCOCCUS/TRICHOMONAS, NAA
Chlamydia by NAA: NEGATIVE
Gonococcus by NAA: NEGATIVE
Trich vag by NAA: POSITIVE — AB

## 2022-09-01 LAB — URINE CULTURE

## 2022-09-02 ENCOUNTER — Telehealth: Payer: Self-pay | Admitting: Obstetrics & Gynecology

## 2022-09-02 ENCOUNTER — Telehealth: Payer: Self-pay | Admitting: Nurse Practitioner

## 2022-09-02 ENCOUNTER — Other Ambulatory Visit: Payer: Self-pay | Admitting: Nurse Practitioner

## 2022-09-02 DIAGNOSIS — A599 Trichomoniasis, unspecified: Secondary | ICD-10-CM

## 2022-09-02 MED ORDER — METRONIDAZOLE 500 MG PO TABS: 500.0000 mg | ORAL_TABLET | Freq: Two times a day (BID) | ORAL | 0 refills | Status: DC

## 2022-09-02 NOTE — Telephone Encounter (Signed)
Returned patient's call. Patient states she is wanting to be treated for most recent results.  Informed patient those tests were performed by her PCP and she would need to give them a call.  Pt verbalized understanding with no further questions.

## 2022-09-02 NOTE — Telephone Encounter (Signed)
Pt requesting antibiotic to be sent in for positive trich test. Pt seen Hoyle Sauer on 08/28/22. Please advise. Thank you  [facility].

## 2022-09-02 NOTE — Telephone Encounter (Signed)
Patient called concerning her lab results and was wanting something called in. Please advise.

## 2022-09-03 ENCOUNTER — Ambulatory Visit (HOSPITAL_COMMUNITY)
Admission: RE | Admit: 2022-09-03 | Discharge: 2022-09-03 | Disposition: A | Discharge status: Home / Self Care | Payer: Medicaid Other | Source: Ambulatory Visit | Attending: Adult Health | Admitting: Adult Health

## 2022-09-03 DIAGNOSIS — D250 Submucous leiomyoma of uterus: Secondary | ICD-10-CM | POA: Diagnosis not present

## 2022-09-03 DIAGNOSIS — N888 Other specified noninflammatory disorders of cervix uteri: Secondary | ICD-10-CM | POA: Diagnosis not present

## 2022-09-03 DIAGNOSIS — N938 Other specified abnormal uterine and vaginal bleeding: Secondary | ICD-10-CM | POA: Diagnosis not present

## 2022-09-03 NOTE — Telephone Encounter (Signed)
Pt has been made aware. 

## 2022-09-04 ENCOUNTER — Other Ambulatory Visit: Payer: Self-pay | Admitting: Nurse Practitioner

## 2022-09-04 MED ORDER — CEPHALEXIN 500 MG PO CAPS: 500.0000 mg | ORAL_CAPSULE | Freq: Three times a day (TID) | ORAL | 0 refills | Status: DC

## 2022-09-08 ENCOUNTER — Ambulatory Visit: Payer: Medicaid Other | Admitting: Adult Health

## 2022-09-09 ENCOUNTER — Other Ambulatory Visit (HOSPITAL_COMMUNITY)
Admission: RE | Admit: 2022-09-09 | Discharge: 2022-09-09 | Disposition: A | Discharge status: Home / Self Care | Payer: Medicaid Other | Source: Ambulatory Visit | Attending: Adult Health | Admitting: Adult Health

## 2022-09-09 ENCOUNTER — Encounter: Payer: Self-pay | Admitting: Adult Health

## 2022-09-09 ENCOUNTER — Ambulatory Visit: Payer: Medicaid Other | Admitting: Adult Health

## 2022-09-09 VITALS — BP 134/95 | HR 97 | Ht <= 58 in | Wt 147.0 lb

## 2022-09-09 DIAGNOSIS — Z09 Encounter for follow-up examination after completed treatment for conditions other than malignant neoplasm: Secondary | ICD-10-CM | POA: Insufficient documentation

## 2022-09-09 DIAGNOSIS — D219 Benign neoplasm of connective and other soft tissue, unspecified: Secondary | ICD-10-CM | POA: Diagnosis not present

## 2022-09-09 DIAGNOSIS — N938 Other specified abnormal uterine and vaginal bleeding: Secondary | ICD-10-CM

## 2022-09-09 DIAGNOSIS — Z8619 Personal history of other infectious and parasitic diseases: Secondary | ICD-10-CM

## 2022-09-09 MED ORDER — NORETHINDRONE 0.35 MG PO TABS: 1.0000 | ORAL_TABLET | Freq: Every day | ORAL | 11 refills | Status: DC

## 2022-09-09 NOTE — Progress Notes (Signed)
  Subjective:     Patient ID: Debra Tanner, female   DOB: 08/21/1981, 41 y.o.   MRN: [id number]  HPI Debra Tanner is a 41 year old black female, single, G2P1011 in to assess bleeding and it stopped 09/01/22 and wants POC for recent trich and review [place].  Last pap was 05/14/22, negative for malignancy and HPV  PCP is Debra Baumgarten NP  Review of Systems Bleeding stopped  Reviewed past medical,surgical, social and family history. Reviewed medications and allergies.     Objective:   Physical Exam BP (!) 134/95 (BP Location: Left Arm, Patient Position: Sitting, Cuff Size: Normal)   Pulse 97   Ht '4\' 9"'$  (1.448 m)   Wt 147 lb (66.7 kg)   LMP 08/19/2022   BMI 31.81 kg/m     Skin warm and dry.Pelvic: external genitalia is normal in appearance no lesions, vagina: scant discharge without odor,urethra has no lesions or masses noted, cervix:smooth and bulbous, uterus: normal size, shape and contour, non tender, no masses felt, adnexa: no masses or tenderness noted. Bladder is non tender and no masses felt. CV swab obtained. 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   Otherwise negative exam.  Upstream - 09/09/22 1606       Pregnancy Intention Screening   Does the patient want to become pregnant in the next year? No    Does the patient's partner want to become pregnant in the next year? No    Would the patient like to discuss contraceptive options today? No      Contraception Wrap Up   Current Method Female Condom    End Method Female Condom            Examination chaperoned by Levy Pupa LPN  Assessment:     1. DUB (dysfunctional uterine bleeding) Bleeding stopped 10/10, stop megace will rx Micronor, to start in am to see if controls periods Meds ordered this encounter  Medications   norethindrone (MICRONOR) 0.35 MG tablet    Sig: Take 1 tablet (0.35 mg total) by mouth daily.    Dispense:  28 tablet    Refill:  11    Order  Specific Question:   Supervising Provider    Answer:   Tania Ade H [[id number]]     2. History of trichomoniasis CV swab sent for POT trich  3. Fibroid Has 4 cm fibroid Will follow  4. Follow-up exam after treatment She wanted POT for recent trich, she said she took her meds CV swab sent for trich     Plan:     Follow up in 3 months for ROS, if still having bleeding issues, Consider ablation

## 2022-09-09 NOTE — Progress Notes (Deleted)
  Subjective:     Patient ID: Debra Tanner, female   DOB: Nov 02, 1981, 41 y.o.   MRN: [id number]  HPI   Review of Systems     Objective:   Physical Exam     Assessment:     ***    Plan:     ***

## 2022-09-11 LAB — CERVICOVAGINAL ANCILLARY ONLY
Comment: NEGATIVE
Trichomonas: NEGATIVE

## 2022-09-15 ENCOUNTER — Ambulatory Visit: Payer: Medicaid Other | Admitting: Nurse Practitioner

## 2022-09-15 VITALS — BP 142/86 | Wt 146.8 lb

## 2022-09-15 DIAGNOSIS — D219 Benign neoplasm of connective and other soft tissue, unspecified: Secondary | ICD-10-CM | POA: Diagnosis not present

## 2022-09-15 DIAGNOSIS — N898 Other specified noninflammatory disorders of vagina: Secondary | ICD-10-CM | POA: Diagnosis not present

## 2022-09-15 DIAGNOSIS — E1165 Type 2 diabetes mellitus with hyperglycemia: Secondary | ICD-10-CM

## 2022-09-15 DIAGNOSIS — E782 Mixed hyperlipidemia: Secondary | ICD-10-CM | POA: Diagnosis not present

## 2022-09-15 DIAGNOSIS — I10 Essential (primary) hypertension: Secondary | ICD-10-CM | POA: Diagnosis not present

## 2022-09-15 DIAGNOSIS — R300 Dysuria: Secondary | ICD-10-CM | POA: Diagnosis not present

## 2022-09-15 DIAGNOSIS — D500 Iron deficiency anemia secondary to blood loss (chronic): Secondary | ICD-10-CM | POA: Diagnosis not present

## 2022-09-15 LAB — POCT URINALYSIS DIPSTICK
Spec Grav, UA: 1.015 (ref 1.010–1.025)
pH, UA: 6 (ref 5.0–8.0)

## 2022-09-15 MED ORDER — FERROUS SULFATE 325 (65 FE) MG PO TABS: 325.0000 mg | ORAL_TABLET | Freq: Every day | ORAL | 2 refills | Status: DC

## 2022-09-15 NOTE — Progress Notes (Signed)
   Subjective:    Patient ID: Debra Tanner, female    DOB: [birth year], 41 y.o.   MRN: [id number]  Diabetes She presents for her follow-up diabetic visit. She has type 2 diabetes mellitus.   Just started BCP via GYN on Thursday and has started spotted Patient was treated for UTI via GYN and wanted urine rechecked due to continued irritation after finishing antibiotics. Patient would like testing for menopause Review of Systems     Objective:   Physical Exam        Assessment & Plan:

## 2022-09-16 ENCOUNTER — Encounter: Payer: Self-pay | Admitting: Nurse Practitioner

## 2022-09-16 LAB — CBC WITH DIFFERENTIAL/PLATELET
Basophils Absolute: 0.1 10*3/uL (ref 0.0–0.2)
Basos: 1 %
EOS (ABSOLUTE): 0.1 10*3/uL (ref 0.0–0.4)
Eos: 1 %
Hematocrit: 30.6 % — ABNORMAL LOW (ref 34.0–46.6)
Hemoglobin: 9.4 g/dL — ABNORMAL LOW (ref 11.1–15.9)
Immature Grans (Abs): 0 10*3/uL (ref 0.0–0.1)
Immature Granulocytes: 0 %
Lymphocytes Absolute: 3.4 10*3/uL — ABNORMAL HIGH (ref 0.7–3.1)
Lymphs: 38 %
MCH: 25.3 pg — ABNORMAL LOW (ref 26.6–33.0)
MCHC: 30.7 g/dL — ABNORMAL LOW (ref 31.5–35.7)
MCV: 82 fL (ref 79–97)
Monocytes Absolute: 0.7 10*3/uL (ref 0.1–0.9)
Monocytes: 7 %
Neutrophils Absolute: 4.8 10*3/uL (ref 1.4–7.0)
Neutrophils: 53 %
Platelets: 558 10*3/uL — ABNORMAL HIGH (ref 150–450)
RBC: 3.72 x10E6/uL — ABNORMAL LOW (ref 3.77–5.28)
RDW: 13.6 % (ref 11.7–15.4)
WBC: 9.1 10*3/uL (ref 3.4–10.8)

## 2022-09-16 LAB — CMP14+EGFR
ALT: 9 IU/L (ref 0–32)
AST: 13 IU/L (ref 0–40)
Albumin/Globulin Ratio: 1.6 (ref 1.2–2.2)
Albumin: 4.4 g/dL (ref 3.9–4.9)
Alkaline Phosphatase: 61 IU/L (ref 44–121)
BUN/Creatinine Ratio: 14 (ref 9–23)
BUN: 9 mg/dL (ref 6–24)
Bilirubin Total: 0.3 mg/dL (ref 0.0–1.2)
CO2: 20 mmol/L (ref 20–29)
Calcium: 9.1 mg/dL (ref 8.7–10.2)
Chloride: 105 mmol/L (ref 96–106)
Creatinine, Ser: 0.63 mg/dL (ref 0.57–1.00)
Globulin, Total: 2.8 g/dL (ref 1.5–4.5)
Glucose: 143 mg/dL — ABNORMAL HIGH (ref 70–99)
Potassium: 4.2 mmol/L (ref 3.5–5.2)
Sodium: 137 mmol/L (ref 134–144)
Total Protein: 7.2 g/dL (ref 6.0–8.5)
eGFR: 115 mL/min/{1.73_m2} (ref >59)

## 2022-09-16 LAB — LIPID PANEL
Chol/HDL Ratio: 1.8 ratio (ref 0.0–4.4)
Cholesterol, Total: 68 mg/dL — ABNORMAL LOW (ref 100–199)
HDL: 38 mg/dL — ABNORMAL LOW (ref >39)
LDL Chol Calc (NIH): 19 mg/dL (ref 0–99)
Triglycerides: 36 mg/dL (ref 0–149)
VLDL Cholesterol Cal: 11 mg/dL (ref 5–40)

## 2022-09-16 LAB — HEMOGLOBIN A1C
Est. average glucose Bld gHb Est-mCnc: 128 mg/dL
Hgb A1c MFr Bld: 6.1 % — ABNORMAL HIGH (ref 4.8–5.6)

## 2022-09-16 MED ORDER — FLUCONAZOLE 150 MG PO TABS: 150.0000 mg | ORAL_TABLET | Freq: Every day | ORAL | 0 refills | Status: DC

## 2022-09-17 LAB — SPECIMEN STATUS REPORT

## 2022-09-17 LAB — URINE CULTURE

## 2022-09-18 ENCOUNTER — Other Ambulatory Visit: Payer: Self-pay

## 2022-09-18 ENCOUNTER — Telehealth: Payer: Self-pay

## 2022-09-18 ENCOUNTER — Other Ambulatory Visit: Payer: Self-pay | Admitting: Nurse Practitioner

## 2022-09-18 DIAGNOSIS — D500 Iron deficiency anemia secondary to blood loss (chronic): Secondary | ICD-10-CM

## 2022-09-18 DIAGNOSIS — R195 Other fecal abnormalities: Secondary | ICD-10-CM

## 2022-09-18 LAB — IFOBT (OCCULT BLOOD): IFOBT: POSITIVE

## 2022-09-18 NOTE — Telephone Encounter (Signed)
Results sent through mychart 

## 2022-09-18 NOTE — Telephone Encounter (Signed)
Patient dropped off stool collection sample, test was resulted in the system.

## 2022-09-18 NOTE — Telephone Encounter (Signed)
Pt calling and worried about results of stool test. Please advise. Thank you

## 2022-09-21 ENCOUNTER — Encounter: Payer: Self-pay | Admitting: Internal Medicine

## 2022-09-21 ENCOUNTER — Telehealth: Payer: Self-pay | Admitting: Adult Health

## 2022-09-21 NOTE — Telephone Encounter (Signed)
Pt is requesting a call about her irregular bleeding.

## 2022-09-21 NOTE — Telephone Encounter (Signed)
Pt started Micronor 10/19 and started bleeding on 10/22. Pt states the Megace made her bleeding stop. I advised she can't be on Micronor and Megace at the same time. Pt was advised to try and stick with same pill for at least 3 packs to see how it will affect period. Starting birth control can cause irregular bleeding at first. Pt voiced understanding. [place]

## 2022-10-08 ENCOUNTER — Other Ambulatory Visit: Payer: Self-pay | Admitting: Nurse Practitioner

## 2022-10-08 DIAGNOSIS — E119 Type 2 diabetes mellitus without complications: Secondary | ICD-10-CM

## 2022-10-21 ENCOUNTER — Ambulatory Visit (INDEPENDENT_AMBULATORY_CARE_PROVIDER_SITE_OTHER): Payer: Medicaid Other | Admitting: Internal Medicine

## 2022-10-21 ENCOUNTER — Encounter: Payer: Self-pay | Admitting: Internal Medicine

## 2022-10-21 VITALS — BP 136/89 | HR 80 | Temp 98.6°F | Ht <= 58 in | Wt 151.7 lb

## 2022-10-21 DIAGNOSIS — D508 Other iron deficiency anemias: Secondary | ICD-10-CM | POA: Diagnosis not present

## 2022-10-21 DIAGNOSIS — R195 Other fecal abnormalities: Secondary | ICD-10-CM

## 2022-10-21 NOTE — Progress Notes (Signed)
Primary Care Physician:  Coral Spikes, DO Primary Gastroenterologist:  Dr. Abbey Chatters  Chief Complaint  Patient presents with   Anemia    New patient. Referred for IDA/ positive occult.     HPI:   Debra Tanner is a 41 y.o. female who presents to the clinic today by referral from her PCP [facility] [place] [facility] for evaluation.  Patient with iron deficiency anemia, most recent hemoglobin 9.4.  Currently taking iron supplements.  Had a positive iFOBT 09/18/2022.  Denies any melena hematochezia.  Does note chronic vaginal bleeding/menorrhagia.  Recently changed to a different OCP and states this is improved.  Symptoms moderate in severity.  Intermittent in nature.  No family history of colorectal malignancy.  No previous colonoscopy.  Denies any abdominal pain or unintentional weight loss.  Denies any upper GI symptoms including heartburn, reflux, dysphagia/odynophagia, epigastric or chest pain.  Had a transvaginal ultrasound which I personally reviewed10/10/2022 which showed uterine leiomyoma measuring approximately 4.4 cm.  Past Medical History:  Diagnosis Date   Anemia    Depression    Diabetes mellitus without complication ([place])    Essential hypertension, benign 11/07/2019   GAD (generalized anxiety disorder)    Hypertension    Trichimoniasis     No past surgical history on file.  Current Outpatient Medications  Medication Sig Dispense Refill   blood glucose meter kit and supplies KIT Dispense based on patient and insurance preference. Use up to four times daily as directed. (FOR ICD-9 250.00, 250.01). 1 each 0   ferrous sulfate 325 (65 FE) MG tablet Take 1 tablet (325 mg total) by mouth daily with breakfast. 30 tablet 2   ibuprofen (ADVIL) 200 MG tablet Take 200 mg by mouth as needed.     insulin glargine (LANTUS SOLOSTAR) 100 UNIT/ML Solostar Pen Inject 16 Units into the skin daily. 15 mL PRN   Insulin Pen Needle (PEN NEEDLES) 32G X 6 MM MISC Use with Lantus Solostar 100  each 2   metFORMIN (GLUCOPHAGE) 1000 MG tablet TAKE 1 TABLET(1000 MG) BY MOUTH TWICE DAILY WITH A MEAL 180 tablet 0   norethindrone (MICRONOR) 0.35 MG tablet Take 1 tablet (0.35 mg total) by mouth daily. 28 tablet 11   rosuvastatin (CRESTOR) 10 MG tablet Take 1 tablet (10 mg total) by mouth daily. For cholesterol. 90 tablet 0   lisinopril (ZESTRIL) 5 MG tablet Take 1 tablet (5 mg total) by mouth daily. For blood pressure and kidneys. (Patient not taking: Reported on 08/31/2022) 90 tablet 0   No current facility-administered medications for this visit.    Allergies as of 10/21/2022 - Review Complete 10/21/2022  Allergen Reaction Noted   Latex Rash 05/14/2022    Family History  Problem Relation Age of Onset   Alcohol abuse Mother    Cirrhosis Mother    Alcohol abuse Father    Heart disease Father    Hypertension Father    Diabetes Sister    Sickle cell trait Sister    Hypertension Maternal Grandmother    Hyperlipidemia Maternal Grandmother    Cancer Maternal Grandfather        lung   Cancer Paternal Grandfather    Hypertension Paternal Grandmother     Social History   Socioeconomic History   Marital status: Single    Spouse name: Not on file   Number of children: 1   Years of education: 12   Highest education level: Not on file  Occupational History   Occupation: disabled  Comment: Mental  Tobacco Use   Smoking status: Never    Passive exposure: Current   Smokeless tobacco: Never  Vaping Use   Vaping Use: Never used  Substance and Sexual Activity   Alcohol use: Not Currently   Drug use: Not Currently    Types: Marijuana   Sexual activity: Yes    Birth control/protection: None, Condom  Other Topics Concern   Not on file  Social History Narrative   Raised by grandmother   Lives with grandmother and son   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   Social Determinants of Health   Financial Resource Strain: Low Risk  (05/14/2022)   Overall  Financial Resource Strain (CARDIA)    Difficulty of Paying Living Expenses: Not hard at all  Food Insecurity: Food Insecurity Present (05/14/2022)   Hunger Vital Sign    Worried About [place] in the Last Year: Sometimes true    Ran Out of Food in the Last Year: Sometimes true  Transportation Needs: No Transportation Needs (05/14/2022)   PRAPARE - Hydrologist (Medical): No    Lack of Transportation (Non-Medical): No  Physical Activity: Unknown (05/14/2022)   Exercise Vital Sign    Days of Exercise per Week: Patient refused    Minutes of Exercise per Session: Patient refused  Stress: Stress Concern Present (05/14/2022)   [place]    Feeling of Stress : To some extent  Social Connections: Unknown (05/14/2022)   Social Connection and Isolation Panel [NHANES]    Frequency of Communication with Friends and Family: Three times a week    Frequency of Social Gatherings with Friends and Family: Three times a week    Attends Religious Services: 1 to 4 times per year    Active Member of Clubs or Organizations: No    Attends Archivist Meetings: Never    Marital Status: Patient refused  Intimate Partner Violence: Not At Risk (05/14/2022)   Humiliation, Afraid, Rape, and Kick questionnaire    Fear of Current or Ex-Partner: No    Emotionally Abused: No    Physically Abused: No    Sexually Abused: No    Subjective: Review of Systems  Constitutional:  Negative for chills and fever.  HENT:  Negative for congestion and hearing loss.   Eyes:  Negative for blurred vision and double vision.  Respiratory:  Negative for cough and shortness of breath.   Cardiovascular:  Negative for chest pain and palpitations.  Gastrointestinal:  Negative for abdominal pain, blood in stool, constipation, diarrhea, heartburn, melena and vomiting.  Genitourinary:  Negative for dysuria and urgency.   Musculoskeletal:  Negative for joint pain and myalgias.  Skin:  Negative for itching and rash.  Neurological:  Negative for dizziness and headaches.  Psychiatric/Behavioral:  Negative for depression. The patient is not nervous/anxious.        Objective: BP 136/89 (BP Location: Left Arm, Patient Position: Sitting, Cuff Size: Normal)   Pulse 80   Temp 98.6 F (37 C) (Oral)   Ht _0  (1.448 m)   Wt 151 lb 11.2 oz (68.8 kg)   BMI 32.83 kg/m  Physical Exam Constitutional:      Appearance: Normal appearance.  HENT:     Head: Normocephalic and atraumatic.  Eyes:     Extraocular Movements: Extraocular movements intact.     Conjunctiva/sclera: Conjunctivae normal.  Cardiovascular:     Rate and Rhythm:  Normal rate and regular rhythm.  Pulmonary:     Effort: Pulmonary effort is normal.     Breath sounds: Normal breath sounds.  Abdominal:     General: Bowel sounds are normal.     Palpations: Abdomen is soft.  Musculoskeletal:        General: No swelling. Normal range of motion.     Cervical back: Normal range of motion and neck supple.  Skin:    General: Skin is warm and dry.     Coloration: Skin is not jaundiced.  Neurological:     General: No focal deficit present.     Mental Status: She is alert and oriented to person, place, and time.  Psychiatric:        Mood and Affect: Mood normal.        Behavior: Behavior normal.      Assessment: *Iron deficiency anemia  *Positive heme occult    Plan: Given chronic iron deficiency anemia and positive Hemoccult, offered colonoscopy to further evaluate today.  Patient would like to see how she does clinically as her vaginal bleeding is improved on recent change in her OCP.  I discussed that we could be missing something GI related and she understands.  Continue to monitor CBC.  Continue iron supplementation.  Follow-up with GI in 4 months to reevaluate.  Patient instructed to call office if she has evidence of rectal bleeding  we can schedule colonoscopy at that time.  She is agreeable.  10/21/2022 3:21 PM   Disclaimer: This note was dictated with voice recognition software. Similar sounding words can inadvertently be transcribed and may not be corrected upon review.

## 2022-10-21 NOTE — Patient Instructions (Signed)
I think it is reasonable to watch and see how you do for now.  I am happy to hear that your vaginal bleeding is improved.  We will continue to monitor your blood counts and iron levels.  Follow-up with GI in 4 months.  If you have evidence of blood in your stool then call us beforehand.  We will reconsider colonoscopy on follow-up visit pending clinical course.  It was very nice meeting you today.  Dr. Abbey Chatters

## 2022-11-04 ENCOUNTER — Ambulatory Visit (INDEPENDENT_AMBULATORY_CARE_PROVIDER_SITE_OTHER): Payer: Medicaid Other | Admitting: Adult Health

## 2022-11-04 ENCOUNTER — Encounter: Payer: Self-pay | Admitting: Adult Health

## 2022-11-04 VITALS — BP 148/97 | HR 72 | Ht <= 58 in | Wt 152.0 lb

## 2022-11-04 DIAGNOSIS — I10 Essential (primary) hypertension: Secondary | ICD-10-CM

## 2022-11-04 DIAGNOSIS — D219 Benign neoplasm of connective and other soft tissue, unspecified: Secondary | ICD-10-CM | POA: Diagnosis not present

## 2022-11-04 DIAGNOSIS — N938 Other specified abnormal uterine and vaginal bleeding: Secondary | ICD-10-CM | POA: Diagnosis not present

## 2022-11-04 DIAGNOSIS — R8290 Unspecified abnormal findings in urine: Secondary | ICD-10-CM | POA: Diagnosis not present

## 2022-11-04 LAB — POCT URINALYSIS DIPSTICK OB
Ketones, UA: NEGATIVE
Leukocytes, UA: NEGATIVE
Nitrite, UA: NEGATIVE
POC,PROTEIN,UA: NEGATIVE

## 2022-11-04 MED ORDER — LISINOPRIL 5 MG PO TABS: 5.0000 mg | ORAL_TABLET | Freq: Every day | ORAL | 0 refills | Status: DC

## 2022-11-04 NOTE — Addendum Note (Signed)
Addended by: Jesusita Oka on: 11/04/2022 10:52 AM   Modules accepted: Orders

## 2022-11-04 NOTE — Progress Notes (Signed)
  Subjective:     Patient ID: Debra Tanner, female   DOB: 02-03-1981, 41 y.o.   MRN: [id number]  HPI Debra Tanner is a 41 year old black female,single, G2P1011, in complaining of still having bleeding til 2 days ago, has clot, is on Micronor since mid October. Had [place] and showed 4.4 cm anterior submucosal fibroid.     Component Value Date/Time   DIAGPAP  05/14/2022 1035    - Negative for intraepithelial lesion or malignancy (NILM)   DIAGPAP  01/25/2020 1428    - Negative for intraepithelial lesion or malignancy (NILM)   DIAGPAP  09/08/2018 0000    NEGATIVE FOR INTRAEPITHELIAL LESIONS OR MALIGNANCY.   DIAGPAP TRICHOMONAS VAGINALIS PRESENT. 09/08/2018 0000   HPVHIGH Negative 05/14/2022 1035   [place] Negative 01/25/2020 1428   ADEQPAP  05/14/2022 1035    Satisfactory for evaluation; transformation zone component ABSENT.   ADEQPAP  01/25/2020 1428    Satisfactory for evaluation; transformation zone component ABSENT.   ADEQPAP  09/08/2018 0000    Satisfactory for evaluation  endocervical/transformation zone component PRESENT.   PCP is Dr Lacinda Axon  Review of Systems Bleeding stopped 2 days ago Urine has odor  Reviewed past medical,surgical, social and family history. Reviewed medications and allergies.     Objective:   Physical Exam BP (!) 148/97 (BP Location: Left Arm, Patient Position: Sitting, Cuff Size: Normal)   Pulse 72   Ht '4\' 9"'$  (1.448 m)   Wt 152 lb (68.9 kg)   BMI 32.89 kg/m  urine trace glucose and trace blood   Skin warm and dry. Lungs: clear to ausculation bilaterally. Cardiovascular: regular rate and rhythm.   Upstream - 11/04/22 1010       Pregnancy Intention Screening   Does the patient want to become pregnant in the next year? No    Does the patient's partner want to become pregnant in the next year? No    Would the patient like to discuss contraceptive options today? No      Contraception Wrap Up   Current Method Oral Contraceptive    End Method Oral  Contraceptive    Contraception Counseling Provided No             Assessment:     1. Abnormal urine odor Push fluids  UA C&S sent   2. Fibroid Gave handout on Fibroids by ACOG  3. DUB (dysfunctional uterine bleeding) Bleeding stopped 2 days ago Continue Micronor has refills Gave handout on ablation, and myfembree  4. Benign essential HTN Get back on BP meds has been off several months Meds ordered this encounter  Medications   lisinopril (ZESTRIL) 5 MG tablet    Sig: Take 1 tablet (5 mg total) by mouth daily. For blood pressure and kidneys.    Dispense:  90 tablet    Refill:  0    Order Specific Question:   Supervising Provider    Answer:   Florian Buff [[id number]]       Plan:     Return as scheduled 12/10/22

## 2022-11-05 LAB — URINALYSIS, ROUTINE W REFLEX MICROSCOPIC
Bilirubin, UA: NEGATIVE
Ketones, UA: NEGATIVE
Leukocytes,UA: NEGATIVE
Nitrite, UA: NEGATIVE
Protein,UA: NEGATIVE
RBC, UA: NEGATIVE
Specific Gravity, UA: 1.023 (ref 1.005–1.030)
Urobilinogen, Ur: 1 mg/dL (ref 0.2–1.0)
pH, UA: 6.5 (ref 5.0–7.5)

## 2022-11-06 LAB — URINE CULTURE

## 2022-11-17 DIAGNOSIS — H5213 Myopia, bilateral: Secondary | ICD-10-CM | POA: Diagnosis not present

## 2022-12-10 ENCOUNTER — Ambulatory Visit: Payer: Medicaid Other | Admitting: Adult Health

## 2022-12-15 ENCOUNTER — Encounter: Payer: Self-pay | Admitting: Adult Health

## 2022-12-15 ENCOUNTER — Ambulatory Visit (INDEPENDENT_AMBULATORY_CARE_PROVIDER_SITE_OTHER): Payer: Medicaid Other | Admitting: Adult Health

## 2022-12-15 VITALS — BP 125/88 | HR 79 | Ht <= 58 in | Wt 153.5 lb

## 2022-12-15 DIAGNOSIS — I10 Essential (primary) hypertension: Secondary | ICD-10-CM | POA: Diagnosis not present

## 2022-12-15 DIAGNOSIS — N920 Excessive and frequent menstruation with regular cycle: Secondary | ICD-10-CM

## 2022-12-15 DIAGNOSIS — R3989 Other symptoms and signs involving the genitourinary system: Secondary | ICD-10-CM | POA: Diagnosis not present

## 2022-12-15 LAB — POCT URINALYSIS DIPSTICK
Blood, UA: NEGATIVE
Glucose, UA: POSITIVE — AB
Ketones, UA: NEGATIVE
Leukocytes, UA: NEGATIVE
Nitrite, UA: NEGATIVE
Protein, UA: POSITIVE — AB

## 2022-12-15 MED ORDER — LISINOPRIL 5 MG PO TABS: 5.0000 mg | ORAL_TABLET | Freq: Every day | ORAL | 3 refills | Status: DC

## 2022-12-15 NOTE — Progress Notes (Signed)
  Subjective:     Patient ID: Debra Tanner, female   DOB: 06/05/1981, 42 y.o.   MRN: [id number]  HPI Debra Tanner is a 42 year old black female,single, G2P1011, back in follow  up on bleeding and is better with Micronor, but as short as she would like. Has odor in urine.     Component Value Date/Time   DIAGPAP  05/14/2022 1035    - Negative for intraepithelial lesion or malignancy (NILM)   DIAGPAP  01/25/2020 1428    - Negative for intraepithelial lesion or malignancy (NILM)   DIAGPAP  09/08/2018 0000    NEGATIVE FOR INTRAEPITHELIAL LESIONS OR MALIGNANCY.   DIAGPAP TRICHOMONAS VAGINALIS PRESENT. 09/08/2018 0000   HPVHIGH Negative 05/14/2022 1035   Debra Tanner Negative 01/25/2020 1428   ADEQPAP  05/14/2022 1035    Satisfactory for evaluation; transformation zone component ABSENT.   ADEQPAP  01/25/2020 1428    Satisfactory for evaluation; transformation zone component ABSENT.   ADEQPAP  09/08/2018 0000    Satisfactory for evaluation  endocervical/transformation zone component PRESENT.   PCP is Dr Lacinda Axon  Review of Systems Bleeding better Odor in urine Cramps at times Reviewed past medical,surgical, social and family history. Reviewed medications and allergies.     Objective:   Physical Exam BP 125/88 (BP Location: Right Arm, Patient Position: Sitting, Cuff Size: Normal)   Pulse 79   Ht '4\' 9"'$  (1.448 m)   Wt 153 lb 8 oz (69.6 kg)   LMP 11/22/2022   BMI 33.22 kg/m     Urine dipstick + glucose and protein. Skin warm and dry.  Lungs: clear to ausculation bilaterally. Cardiovascular: regular rate and rhythm.  Fall risk low  Upstream - 12/15/22 1624       Pregnancy Intention Screening   Does the patient want to become pregnant in the next year? No    Does the patient's partner want to become pregnant in the next year? No    Would the patient like to discuss contraceptive options today? No      Contraception Wrap Up   Current Method Oral Contraceptive    End Method Oral  Contraceptive             Assessment:     1. Abnormal urine color Urine sent for UA C&S to rule out UTI - POCT Urinalysis Dipstick - Urine Culture - Urinalysis, Routine w reflex microscopic  2. Hypertension, unspecified type Will continue lisinopril Refilled  lisinopril 5 mg 1 daily Meds ordered this encounter  Medications   lisinopril (ZESTRIL) 5 MG tablet    Sig: Take 1 tablet (5 mg total) by mouth daily. For blood pressure and kidneys.    Dispense:  90 tablet    Refill:  3    Order Specific Question:   Supervising Provider    Answer:   Elonda Husky, LUTHER H [[id number]]     3. Menorrhagia with regular cycle Bleeding is better Will continue Micronor,has refills Gave handout on liletta IUD     Plan:    Follow up with PCP on blood sugars  Follow up in 3 months

## 2022-12-16 LAB — URINALYSIS, ROUTINE W REFLEX MICROSCOPIC
Bilirubin, UA: NEGATIVE
Ketones, UA: NEGATIVE
Leukocytes,UA: NEGATIVE
Nitrite, UA: NEGATIVE
RBC, UA: NEGATIVE
Specific Gravity, UA: >=1.03 — AB (ref 1.005–1.030)
Urobilinogen, Ur: 1 mg/dL (ref 0.2–1.0)
pH, UA: 5 (ref 5.0–7.5)

## 2022-12-17 LAB — URINE CULTURE: Organism ID, Bacteria: NO GROWTH

## 2023-01-05 ENCOUNTER — Other Ambulatory Visit: Payer: Self-pay | Admitting: Family Medicine

## 2023-01-05 DIAGNOSIS — E119 Type 2 diabetes mellitus without complications: Secondary | ICD-10-CM

## 2023-02-04 ENCOUNTER — Other Ambulatory Visit (INDEPENDENT_AMBULATORY_CARE_PROVIDER_SITE_OTHER): Payer: Medicaid Other | Admitting: *Deleted

## 2023-02-04 ENCOUNTER — Other Ambulatory Visit (HOSPITAL_COMMUNITY)
Admission: RE | Admit: 2023-02-04 | Discharge: 2023-02-04 | Disposition: A | Discharge status: Home / Self Care | Payer: Medicaid Other | Source: Ambulatory Visit | Attending: Obstetrics & Gynecology | Admitting: Obstetrics & Gynecology

## 2023-02-04 DIAGNOSIS — N898 Other specified noninflammatory disorders of vagina: Secondary | ICD-10-CM

## 2023-02-04 LAB — POCT URINALYSIS DIPSTICK OB
Ketones, UA: NEGATIVE
Leukocytes, UA: NEGATIVE
Nitrite, UA: NEGATIVE
POC,PROTEIN,UA: NEGATIVE

## 2023-02-04 NOTE — Progress Notes (Signed)
   NURSE VISIT- VAGINITIS/STD/POC  SUBJECTIVE:  Debra Tanner is a 42 y.o. G2P1011 GYN patientfemale here for a vaginal swab for vaginitis screening, STD screen.  She reports the following symptoms: discharge described as white and local irritation for several days. Denies abnormal vaginal bleeding, significant pelvic pain, fever, or UTI symptoms.  OBJECTIVE:  There were no vitals taken for this visit.  Appears well, in no apparent distress  ASSESSMENT: Vaginal swab for vaginitis screening  PLAN: Self-collected vaginal probe for Gonorrhea, Chlamydia, Trichomonas, Bacterial Vaginosis, Yeast sent to lab Treatment: to be determined once results are received Follow-up as needed if symptoms persist/worsen, or new symptoms develop  Urine also sent for culture. Pt experiencing burning when she urinates.   Janece Canterbury  02/04/2023 2:13 PM

## 2023-02-05 ENCOUNTER — Telehealth: Payer: Self-pay | Admitting: Adult Health

## 2023-02-05 LAB — URINALYSIS
Bilirubin, UA: NEGATIVE
Leukocytes,UA: NEGATIVE
Nitrite, UA: NEGATIVE
RBC, UA: NEGATIVE
Specific Gravity, UA: >=1.03 — AB (ref 1.005–1.030)
Urobilinogen, Ur: 0.2 mg/dL (ref 0.2–1.0)
pH, UA: 5.5 (ref 5.0–7.5)

## 2023-02-05 LAB — CERVICOVAGINAL ANCILLARY ONLY
Bacterial Vaginitis (gardnerella): NEGATIVE
Candida Glabrata: NEGATIVE
Candida Vaginitis: POSITIVE — AB
Chlamydia: NEGATIVE
Comment: NEGATIVE
Comment: NEGATIVE
Comment: NEGATIVE
Comment: NEGATIVE
Comment: NEGATIVE
Comment: NORMAL
Neisseria Gonorrhea: NEGATIVE
Trichomonas: NEGATIVE

## 2023-02-05 NOTE — Telephone Encounter (Signed)
Patient is calling concerning her lab results. Please advise.

## 2023-02-05 NOTE — Telephone Encounter (Signed)
Left message @ 12:13 pm, letting pt know urine culture and swab are still pending. Pt may see results before we do. Once provider sees results, pt will get a mychart message and if any med needs to be sent it, it will be done then. [employer]

## 2023-02-06 LAB — URINE CULTURE

## 2023-02-08 ENCOUNTER — Telehealth: Payer: Self-pay

## 2023-02-08 MED ORDER — FLUCONAZOLE 150 MG PO TABS: 150.0000 mg | ORAL_TABLET | Freq: Once | ORAL | 0 refills | Status: AC

## 2023-02-08 NOTE — Telephone Encounter (Signed)
Patient called and wanted a rx called in for a yeast infection.

## 2023-02-11 ENCOUNTER — Other Ambulatory Visit: Payer: Self-pay | Admitting: Adult Health

## 2023-02-11 MED ORDER — FLUCONAZOLE 150 MG PO TABS: ORAL_TABLET | ORAL | 1 refills | Status: DC

## 2023-02-16 ENCOUNTER — Encounter: Payer: Self-pay | Admitting: Family Medicine

## 2023-02-16 ENCOUNTER — Ambulatory Visit (INDEPENDENT_AMBULATORY_CARE_PROVIDER_SITE_OTHER): Payer: Medicaid Other | Admitting: Family Medicine

## 2023-02-16 VITALS — BP 139/92 | HR 84 | Temp 97.9°F | Ht <= 58 in | Wt 149.0 lb

## 2023-02-16 DIAGNOSIS — D500 Iron deficiency anemia secondary to blood loss (chronic): Secondary | ICD-10-CM | POA: Diagnosis not present

## 2023-02-16 DIAGNOSIS — E119 Type 2 diabetes mellitus without complications: Secondary | ICD-10-CM

## 2023-02-16 DIAGNOSIS — R21 Rash and other nonspecific skin eruption: Secondary | ICD-10-CM | POA: Diagnosis not present

## 2023-02-16 MED ORDER — FLUCONAZOLE 150 MG PO TABS: 150.0000 mg | ORAL_TABLET | ORAL | 0 refills | Status: DC

## 2023-02-16 MED ORDER — TRIAMCINOLONE ACETONIDE 0.5 % EX OINT: 1.0000 | TOPICAL_OINTMENT | Freq: Two times a day (BID) | CUTANEOUS | 0 refills | Status: DC

## 2023-02-16 NOTE — Assessment & Plan Note (Signed)
Uncontrolled/worsening.  A1c today for further evaluation.

## 2023-02-16 NOTE — Patient Instructions (Signed)
Labs today.  Medications as prescribed.  We will call with results.   Take care  Dr. Lacinda Axon

## 2023-02-16 NOTE — Assessment & Plan Note (Signed)
Uncertain etiology.  Concern for tinea corporis.  Oral Diflucan and topical triamcinolone as directed.

## 2023-02-16 NOTE — Progress Notes (Signed)
Subjective:  Patient ID: Debra Tanner, female    DOB: 1981/07/12  Age: 42 y.o. MRN: [id number]  CC: Chief Complaint  Patient presents with   rash all over    Sugars in the 200's     HPI:  42 year old female with type 2 diabetes, hypertension, iron deficiency anemia presents for evaluation of the above.  Patient reports that she has had an ongoing rash for the past 2 weeks.  Located on the arms, back, chest and abdomen.  Mild itching.  She has applied a topical antifungal cream without resolution.  No known inciting factor.  Patient also reports that her blood sugars have been elevated in the 200s.  She has recently restarted her insulin.  Last A1c was 6.1.  She is currently on metformin 1000 mg twice a day and 16 units of insulin daily.  Patient Active Problem List   Diagnosis Date Noted   Rash 02/16/2023   DUB (dysfunctional uterine bleeding) 08/31/2022   Iron deficiency anemia due to chronic blood loss 08/28/2022   Hypertension 11/07/2019   Fibroids, subserous 09/19/2018   Diabetes mellitus without complication ([place]) [id number]   GAD (generalized anxiety disorder) 03/05/2017   History of learning disability 03/05/2017   Obesity (BMI 30-39.9) 03/05/2017    Social Hx   Social History   Socioeconomic History   Marital status: Single    Spouse name: Not on file   Number of children: 1   Years of education: 12   Highest education level: Not on file  Occupational History   Occupation: disabled    Comment: Mental  Tobacco Use   Smoking status: Never    Passive exposure: Current   Smokeless tobacco: Never  Vaping Use   Vaping Use: Never used  Substance and Sexual Activity   Alcohol use: Not Currently   Drug use: Not Currently    Types: Marijuana   Sexual activity: Not Currently    Birth control/protection: Condom, Pill  Other Topics Concern   Not on file  Social History Narrative   Raised by grandmother   Lives with grandmother and son   Disabled from  mental impairment slow learner and anxiety   Social Determinants of Health   Financial Resource Strain: Low Risk  (05/14/2022)   Overall Financial Resource Strain (CARDIA)    Difficulty of Paying Living Expenses: Not hard at all  Food Insecurity: Food Insecurity Present (05/14/2022)   Hunger Vital Sign    Worried About Running Out of Food in the Last Year: Sometimes true    Ran Out of Food in the Last Year: Sometimes true  Transportation Needs: No Transportation Needs (05/14/2022)   PRAPARE - Hydrologist (Medical): No    Lack of Transportation (Non-Medical): No  Physical Activity: Patient Declined (05/14/2022)   Exercise Vital Sign    Days of Exercise per Week: Patient declined    Minutes of Exercise per Session: Patient declined  Stress: Stress Concern Present (05/14/2022)   [place]    Feeling of Stress : To some extent  Social Connections: Unknown (05/14/2022)   Social Connection and Isolation Panel [NHANES]    Frequency of Communication with Friends and Family: Three times a week    Frequency of Social Gatherings with Friends and Family: Three times a week    Attends Religious Services: 1 to 4 times per year    Active Member of Clubs or Organizations: No  Attends Archivist Meetings: Never    Marital Status: Patient declined    Review of Systems Per HPI  Objective:  BP (!) 139/92   Pulse 84   Temp 97.9 F (36.6 C)   Ht 4\' 9"  (1.448 m)   Wt 149 lb (67.6 kg)   SpO2 98%   BMI 32.24 kg/m      02/16/2023    3:40 PM 02/16/2023    3:30 PM 12/15/2022    4:32 PM  BP/Weight  Systolic BP [id number]  Diastolic BP 92 98 88  Wt. (Lbs)  149   BMI  32.24 kg/m2     Physical Exam Vitals and nursing note reviewed.  Constitutional:      General: She is not in acute distress.    Appearance: Normal appearance.  HENT:     Head: Normocephalic and atraumatic.   Cardiovascular:     Rate and Rhythm: Normal rate and regular rhythm.  Pulmonary:     Effort: Pulmonary effort is normal.     Breath sounds: Normal breath sounds. No wheezing, rhonchi or rales.  Skin:    Comments: Diffuse hyperpigmented slightly dry and raised rash.  Neurological:     Mental Status: She is alert.  Psychiatric:        Mood and Affect: Mood normal.        Behavior: Behavior normal.     Lab Results  Component Value Date   WBC 9.1 09/15/2022   HGB 9.4 (L) 09/15/2022   HCT 30.6 (L) 09/15/2022   PLT 558 (H) 09/15/2022   GLUCOSE 143 (H) 09/15/2022   CHOL 68 (L) 09/15/2022   TRIG 36 09/15/2022   HDL 38 (L) 09/15/2022   LDLCALC 19 09/15/2022   ALT 9 09/15/2022   AST 13 09/15/2022   NA 137 09/15/2022   K 4.2 09/15/2022   CL 105 09/15/2022   CREATININE 0.63 09/15/2022   BUN 9 09/15/2022   CO2 20 09/15/2022   TSH 1.46 05/08/2021   HGBA1C 6.1 (H) 09/15/2022   MICROALBUR 0.7 06/24/2020     Assessment & Plan:   Problem List Items Addressed This Visit       Endocrine   Diabetes mellitus without complication ([place])    Uncontrolled/worsening.  A1c today for further evaluation.      Relevant Orders   CMP14+EGFR   Lipid panel   Hemoglobin A1c     Musculoskeletal and Integument   Rash - Primary    Uncertain etiology.  Concern for tinea corporis.  Oral Diflucan and topical triamcinolone as directed.        Other   Iron deficiency anemia due to chronic blood loss   Relevant Orders   CBC   Iron, TIBC and Ferritin Panel    Meds ordered this encounter  Medications   fluconazole (DIFLUCAN) 150 MG tablet    Sig: Take 1 tablet (150 mg total) by mouth once a week.    Dispense:  4 tablet    Refill:  0   triamcinolone ointment (KENALOG) 0.5 %    Sig: Apply 1 Application topically 2 (two) times daily.    Dispense:  30 g    Refill:  0    Follow-up:  Pending labs  [place]

## 2023-02-17 ENCOUNTER — Encounter: Payer: Self-pay | Admitting: Internal Medicine

## 2023-02-17 ENCOUNTER — Ambulatory Visit (INDEPENDENT_AMBULATORY_CARE_PROVIDER_SITE_OTHER): Payer: Medicaid Other | Admitting: Internal Medicine

## 2023-02-17 VITALS — BP 138/90 | HR 92 | Temp 98.0°F | Ht <= 58 in | Wt 151.3 lb

## 2023-02-17 DIAGNOSIS — R195 Other fecal abnormalities: Secondary | ICD-10-CM

## 2023-02-17 DIAGNOSIS — D508 Other iron deficiency anemias: Secondary | ICD-10-CM

## 2023-02-17 LAB — CBC
Hematocrit: 40.1 % (ref 34.0–46.6)
Hemoglobin: 12.5 g/dL (ref 11.1–15.9)
MCH: 28.7 pg (ref 26.6–33.0)
MCHC: 31.2 g/dL — ABNORMAL LOW (ref 31.5–35.7)
MCV: 92 fL (ref 79–97)
Platelets: 426 10*3/uL (ref 150–450)
RBC: 4.35 x10E6/uL (ref 3.77–5.28)
RDW: 11.3 % — ABNORMAL LOW (ref 11.7–15.4)
WBC: 8.7 10*3/uL (ref 3.4–10.8)

## 2023-02-17 LAB — CMP14+EGFR
ALT: 9 IU/L (ref 0–32)
AST: 12 IU/L (ref 0–40)
Albumin/Globulin Ratio: 1.5 (ref 1.2–2.2)
Albumin: 4.1 g/dL (ref 3.9–4.9)
Alkaline Phosphatase: 80 IU/L (ref 44–121)
BUN/Creatinine Ratio: 11 (ref 9–23)
BUN: 8 mg/dL (ref 6–24)
Bilirubin Total: 0.4 mg/dL (ref 0.0–1.2)
CO2: 20 mmol/L (ref 20–29)
Calcium: 9.8 mg/dL (ref 8.7–10.2)
Chloride: 99 mmol/L (ref 96–106)
Creatinine, Ser: 0.76 mg/dL (ref 0.57–1.00)
Globulin, Total: 2.8 g/dL (ref 1.5–4.5)
Glucose: 283 mg/dL — ABNORMAL HIGH (ref 70–99)
Potassium: 4.4 mmol/L (ref 3.5–5.2)
Sodium: 137 mmol/L (ref 134–144)
Total Protein: 6.9 g/dL (ref 6.0–8.5)
eGFR: 101 mL/min/{1.73_m2} (ref >59)

## 2023-02-17 LAB — LIPID PANEL
Chol/HDL Ratio: 3.2 ratio (ref 0.0–4.4)
Cholesterol, Total: 119 mg/dL (ref 100–199)
HDL: 37 mg/dL — ABNORMAL LOW (ref >39)
LDL Chol Calc (NIH): 60 mg/dL (ref 0–99)
Triglycerides: 121 mg/dL (ref 0–149)
VLDL Cholesterol Cal: 22 mg/dL (ref 5–40)

## 2023-02-17 LAB — IRON,TIBC AND FERRITIN PANEL
Ferritin: 17 ng/mL (ref 15–150)
Iron Saturation: 11 % — ABNORMAL LOW (ref 15–55)
Iron: 43 ug/dL (ref 27–159)
Total Iron Binding Capacity: 380 ug/dL (ref 250–450)
UIBC: 337 ug/dL (ref 131–425)

## 2023-02-17 LAB — HEMOGLOBIN A1C
Est. average glucose Bld gHb Est-mCnc: 186 mg/dL
Hgb A1c MFr Bld: 8.1 % — ABNORMAL HIGH (ref 4.8–5.6)

## 2023-02-17 NOTE — Patient Instructions (Signed)
Your blood counts and iron levels are much improved today.  I am happy that everything worked out well.  Would recommend you discuss further with Dr. Lacinda Axon in regards to getting your diabetes under better control.  I think you can follow-up with GI as needed at this point.  Let us know if you have any evidence of blood in your stools.  Recommend you get a colonoscopy for colon cancer screening at age 42.  It was very nice seeing you again today.  Dr. Abbey Chatters

## 2023-02-17 NOTE — Progress Notes (Signed)
Primary Care Physician:  Coral Spikes, DO Primary Gastroenterologist:  Dr. Abbey Chatters  Chief Complaint  Patient presents with   Anemia    Follow up on anemia. Patient had positive occult stool test at last visit. Reports she thinks it was from her menstrual cycle. She has not noticed any blood in stool and not having any fatigue, sob or dizziness.     HPI:   Debra Tanner is a 42 y.o. female who presents to the clinic today for follow-up visit.  Currently taking iron supplements.  Blood work: 09/15/2022: Hemoglobin 9.4, positive iFOBT 02/16/2023: Hemoglobin 12.5, iron 43, ferritin 17  Denies any melena hematochezia.  Does note chronic vaginal bleeding/menorrhagia.  She was changed to a different OCP and states this is improved.  Symptoms moderate in severity.  Intermittent in nature.  No family history of colorectal malignancy.  No previous colonoscopy.  Denies any abdominal pain or unintentional weight loss.  Denies any upper GI symptoms including heartburn, reflux, dysphagia/odynophagia, epigastric or chest pain.  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  I offered colonoscopy on previous visit the patient wanted to hold off and monitor.    Past Medical History:  Diagnosis Date   Anemia    Depression    Diabetes mellitus without complication ([place])    Encounter for gynecological examination with Papanicolaou smear of cervix 09/08/2018   Essential hypertension, benign 11/07/2019   GAD (generalized anxiety disorder)    Hypertension    Trichimoniasis     No past surgical history on file.  Current Outpatient Medications  Medication Sig Dispense Refill   ferrous sulfate 325 (65 FE) MG tablet Take 1 tablet (325 mg total) by mouth daily with breakfast. 30 tablet 2   fluconazole (DIFLUCAN) 150 MG tablet Take 1 tablet (150 mg total) by mouth once a week. 4 tablet 0   ibuprofen (ADVIL) 200 MG tablet Take 200 mg by mouth  as needed.     insulin glargine (LANTUS SOLOSTAR) 100 UNIT/ML Solostar Pen Inject 16 Units into the skin daily. 15 mL PRN   Insulin Pen Needle (PEN NEEDLES) 32G X 6 MM MISC Use with Lantus Solostar 100 each 2   lisinopril (ZESTRIL) 5 MG tablet Take 1 tablet (5 mg total) by mouth daily. For blood pressure and kidneys. 90 tablet 3   metFORMIN (GLUCOPHAGE) 1000 MG tablet TAKE 1 TABLET BY MOUTH TWICE DAILY WITH A MEAL 180 tablet 0   norethindrone (MICRONOR) 0.35 MG tablet Take 1 tablet (0.35 mg total) by mouth daily. 28 tablet 11   rosuvastatin (CRESTOR) 10 MG tablet Take 1 tablet (10 mg total) by mouth daily. For cholesterol. (Patient not taking: Reported on 12/15/2022) 90 tablet 0   triamcinolone ointment (KENALOG) 0.5 % Apply 1 Application topically 2 (two) times daily. 30 g 0   No current facility-administered medications for this visit.    Allergies as of 02/17/2023 - Review Complete 02/17/2023  Allergen Reaction Noted   Latex Rash 05/14/2022    Family History  Problem Relation Age of Onset   Alcohol abuse Mother    Cirrhosis Mother    Alcohol abuse Father    Heart disease Father    Hypertension Father    Diabetes Sister    Sickle cell trait Sister    Hypertension Maternal Grandmother    Hyperlipidemia Maternal Grandmother    Cancer Maternal Grandfather        lung   Cancer Paternal Grandfather  Hypertension Paternal Grandmother     Social History   Socioeconomic History   Marital status: Single    Spouse name: Not on file   Number of children: 1   Years of education: 12   Highest education level: Not on file  Occupational History   Occupation: disabled    Comment: Mental  Tobacco Use   Smoking status: Never    Passive exposure: Current   Smokeless tobacco: Never  Vaping Use   Vaping Use: Never used  Substance and Sexual Activity   Alcohol use: Not Currently   Drug use: Not Currently    Types: Marijuana   Sexual activity: Not Currently    Birth  control/protection: Condom, Pill  Other Topics Concern   Not on file  Social History Narrative   Raised by grandmother   Lives with grandmother and son   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   Social Determinants of Health   Financial Resource Strain: Low Risk  (05/14/2022)   Overall Financial Resource Strain (CARDIA)    Difficulty of Paying Living Expenses: Not hard at all  Food Insecurity: Food Insecurity Present (05/14/2022)   Hunger Vital Sign    Worried About [place] in the Last Year: Sometimes true    Ran Out of Food in the Last Year: Sometimes true  Transportation Needs: No Transportation Needs (05/14/2022)   PRAPARE - Hydrologist (Medical): No    Lack of Transportation (Non-Medical): No  Physical Activity: Patient Declined (05/14/2022)   Exercise Vital Sign    Days of Exercise per Week: Patient declined    Minutes of Exercise per Session: Patient declined  Stress: Stress Concern Present (05/14/2022)   [place]    Feeling of Stress : To some extent  Social Connections: Unknown (05/14/2022)   Social Connection and Isolation Panel [NHANES]    Frequency of Communication with Friends and Family: Three times a week    Frequency of Social Gatherings with Friends and Family: Three times a week    Attends Religious Services: 1 to 4 times per year    Active Member of Clubs or Organizations: No    Attends Archivist Meetings: Never    Marital Status: Patient declined  Intimate Partner Violence: Not At Risk (05/14/2022)   Humiliation, Afraid, Rape, and Kick questionnaire    Fear of Current or Ex-Partner: No    Emotionally Abused: No    Physically Abused: No    Sexually Abused: No    Subjective: Review of Systems  Constitutional:  Negative for chills and fever.  HENT:  Negative for congestion and hearing loss.   Eyes:  Negative for blurred  vision and double vision.  Respiratory:  Negative for cough and shortness of breath.   Cardiovascular:  Negative for chest pain and palpitations.  Gastrointestinal:  Negative for abdominal pain, blood in stool, constipation, diarrhea, heartburn, melena and vomiting.  Genitourinary:  Negative for dysuria and urgency.  Musculoskeletal:  Negative for joint pain and myalgias.  Skin:  Negative for itching and rash.  Neurological:  Negative for dizziness and headaches.  Psychiatric/Behavioral:  Negative for depression. The patient is not nervous/anxious.        Objective: There were no vitals taken for this visit. Physical Exam Constitutional:      Appearance: Normal appearance.  HENT:     Head: Normocephalic and atraumatic.  Eyes:     Extraocular  Movements: Extraocular movements intact.     Conjunctiva/sclera: Conjunctivae normal.  Cardiovascular:     Rate and Rhythm: Normal rate and regular rhythm.  Pulmonary:     Effort: Pulmonary effort is normal.     Breath sounds: Normal breath sounds.  Abdominal:     General: Bowel sounds are normal.     Palpations: Abdomen is soft.  Musculoskeletal:        General: No swelling. Normal range of motion.     Cervical back: Normal range of motion and neck supple.  Skin:    General: Skin is warm and dry.     Coloration: Skin is not jaundiced.  Neurological:     General: No focal deficit present.     Mental Status: She is alert and oriented to person, place, and time.  Psychiatric:        Mood and Affect: Mood normal.        Behavior: Behavior normal.      Assessment: *Iron deficiency anemia-improved *Positive heme occult    Plan: Given chronic iron deficiency anemia and positive Hemoccult, offered colonoscopy to further evaluate.  Patient would like to continue to monitor for now given her improvement in hemoglobin, iron levels, as well as improvement in vaginal bleeding since changing OCPs..  I discussed that we could be missing  something GI related and she understands.  Continue to monitor CBC.    Given her improvement, I think she can follow-up with GI as needed.  Would recommend that she have colonoscopy for colon cancer screening purposes at age 41.  She understands.  02/17/2023 1:29 PM   Disclaimer: This note was dictated with voice recognition software. Similar sounding words can inadvertently be transcribed and may not be corrected upon review.

## 2023-02-23 ENCOUNTER — Telehealth: Payer: Self-pay

## 2023-02-23 NOTE — Telephone Encounter (Signed)
Medication Refill pt is out of triamcinolone ointment (KENALOG) 0.5 % Pt just ordered last week and she said she is out of medication already.  [place] [phone]

## 2023-02-24 ENCOUNTER — Other Ambulatory Visit: Payer: Self-pay | Admitting: Family Medicine

## 2023-02-24 MED ORDER — TRIAMCINOLONE ACETONIDE 0.5 % EX OINT: TOPICAL_OINTMENT | CUTANEOUS | 0 refills | Status: DC

## 2023-02-24 NOTE — Telephone Encounter (Signed)
I did send in a new prescription for triamcinolone a larger amount hopefully this will be filled by her pharmacy.  If she is having ongoing troubles with rash for it is not getting better I recommend dermatology consult for follow-up with Dr. Lacinda Axon what ever she prefers thank you

## 2023-02-25 ENCOUNTER — Telehealth: Payer: Self-pay

## 2023-02-25 NOTE — Telephone Encounter (Signed)
Prescription Request  02/25/2023  LOV: Visit date not found  What is the name of the medication or equipment? Accu-Chek strips and needles   Have you contacted your pharmacy to request a refill? Yes   Which pharmacy would you like this sent to?  [id number] - [place], [place] [address] [place] [address] Phone: [phone] Fax: [phone]    Patient notified that their request is being sent to the clinical staff for review and that they should receive a response within 2 business days.   Please advise at Mobile [phone] (mobile)

## 2023-02-26 ENCOUNTER — Other Ambulatory Visit: Payer: Self-pay | Admitting: Nurse Practitioner

## 2023-02-26 MED ORDER — BLOOD GLUCOSE TEST VI STRP: ORAL_STRIP | 5 refills | Status: DC

## 2023-02-26 MED ORDER — LANCETS THIN MISC: 5 refills | Status: DC

## 2023-02-26 NOTE — Telephone Encounter (Signed)
Left message to return call 

## 2023-02-26 NOTE — Telephone Encounter (Signed)
Done

## 2023-03-02 NOTE — Telephone Encounter (Signed)
Left message to return call 

## 2023-03-16 ENCOUNTER — Encounter: Payer: Self-pay | Admitting: Adult Health

## 2023-03-16 ENCOUNTER — Ambulatory Visit (INDEPENDENT_AMBULATORY_CARE_PROVIDER_SITE_OTHER): Payer: Medicaid Other | Admitting: Adult Health

## 2023-03-16 VITALS — BP 147/101 | HR 108 | Ht <= 58 in | Wt 152.0 lb

## 2023-03-16 DIAGNOSIS — N920 Excessive and frequent menstruation with regular cycle: Secondary | ICD-10-CM | POA: Diagnosis not present

## 2023-03-16 DIAGNOSIS — I10 Essential (primary) hypertension: Secondary | ICD-10-CM

## 2023-03-16 DIAGNOSIS — D219 Benign neoplasm of connective and other soft tissue, unspecified: Secondary | ICD-10-CM | POA: Diagnosis not present

## 2023-03-16 HISTORY — DX: Benign neoplasm of connective and other soft tissue, unspecified: D21.9

## 2023-03-16 MED ORDER — LISINOPRIL 5 MG PO TABS: 5.0000 mg | ORAL_TABLET | Freq: Every day | ORAL | 3 refills | Status: DC

## 2023-03-16 NOTE — Progress Notes (Addendum)
Subjective:     Patient ID: Debra Tanner, female   DOB: 09-Feb-1981, 42 y.o.   MRN: [id number]  HPI Debra Tanner is a 42 year old black female,single, G2P1011 in for BP check and ROS on bleeding. She has not taken BP meds in over a month, maybe 2, and has some clots with periods.      Component Value Date/Time   DIAGPAP  05/14/2022 1035    - Negative for intraepithelial lesion or malignancy (NILM)   DIAGPAP  01/25/2020 1428    - Negative for intraepithelial lesion or malignancy (NILM)   DIAGPAP  09/08/2018 0000    NEGATIVE FOR INTRAEPITHELIAL LESIONS OR MALIGNANCY.   DIAGPAP TRICHOMONAS VAGINALIS PRESENT. 09/08/2018 0000   HPVHIGH Negative 05/14/2022 1035   HPVHIGH Negative 01/25/2020 1428   ADEQPAP  05/14/2022 1035    Satisfactory for evaluation; transformation zone component ABSENT.   ADEQPAP  01/25/2020 1428    Satisfactory for evaluation; transformation zone component ABSENT.   ADEQPAP  09/08/2018 0000    Satisfactory for evaluation  endocervical/transformation zone component PRESENT.   PCP is Dr Adriana Simas  Review of Systems +clots with periods    Reviewed past medical,surgical, social and family history. Reviewed medications and allergies.  Objective:   Physical Exam BP (!) 147/101 (BP Location: Left Arm, Patient Position: Sitting, Cuff Size: Normal)   Pulse (!) 108   Ht  (1.448 m)   Wt 152 lb (68.9 kg)   LMP 03/05/2023   BMI 32.89 kg/m  Skin warm and dry.  Lungs: clear to ausculation bilaterally. Cardiovascular: regular rate and rhythm.        03/16/2023    4:33 PM 02/16/2023    3:42 PM 05/14/2022    9:36 AM  Depression screen PHQ 2/9  Decreased Interest 0 1 1  Down, Depressed, Hopeless 0 1 1  PHQ - 2 Score 0 2 2  Altered sleeping  2 1  Tired, decreased energy  2 1  Change in appetite  0 0  Feeling bad or failure about yourself   0 0  Trouble concentrating  2 0  Moving slowly or fidgety/restless  2 0  Suicidal thoughts  0 0  PHQ-9 Score  10 4  Difficult doing  work/chores  Somewhat difficult      Upstream - 03/16/23 1625       Pregnancy Intention Screening   Does the patient want to become pregnant in the next year? No    Does the patient's partner want to become pregnant in the next year? No    Would the patient like to discuss contraceptive options today? No      Contraception Wrap Up   Current Method Oral Contraceptive;Abstinence    End Method Oral Contraceptive;Abstinence    Contraception Counseling Provided No             Assessment:     1. Menorrhagia with regular cycle Has clots Will continue Micronor, has refills She declines IUD for now  2. Fibroid  3. Hypertension, unspecified type Get back on lisinopril Meds ordered this encounter  Medications   lisinopril (ZESTRIL) 5 MG tablet    Sig: Take 1 tablet (5 mg total) by mouth daily. For blood pressure and kidneys.    Dispense:  90 tablet    Refill:  3    Order Specific Question:   Supervising Provider    Answer:   Lazaro Arms [[id number]]    Will recheck in 6 weeks  Follow up  with PCP as scheduled     Plan:     Follow up in 6 weeks for ROS and BP check

## 2023-03-17 ENCOUNTER — Encounter: Payer: Self-pay | Admitting: *Deleted

## 2023-03-17 NOTE — Telephone Encounter (Signed)
Unable to reach patient by phone.  Message with provider recommendations sent to patient via my chart.

## 2023-03-25 ENCOUNTER — Telehealth: Payer: Self-pay

## 2023-03-25 MED ORDER — PEN NEEDLES 32G X 6 MM MISC: 2 refills | Status: DC

## 2023-03-25 NOTE — Telephone Encounter (Signed)
Prescription Request  03/25/2023  LOV: Visit date not found  What is the name of the medication or equipment? nsulin Pen Needle (PEN NEEDLES) 32G X 6 MM MISC   Have you contacted your pharmacy to request a refill? Yes   Which pharmacy would you like this sent to?  [id number] - [place], [place] - [address] [place] [address] Phone: [phone] Fax: [phone]    Patient notified that their request is being sent to the clinical staff for review and that they should receive a response within 2 business days.   Please advise at Mobile [phone] (mobile)

## 2023-03-25 NOTE — Telephone Encounter (Signed)
Received via fax Rx request: Prescription sent electronically to pharmacy  

## 2023-04-27 ENCOUNTER — Ambulatory Visit (INDEPENDENT_AMBULATORY_CARE_PROVIDER_SITE_OTHER): Payer: Medicaid Other | Admitting: Adult Health

## 2023-04-27 ENCOUNTER — Encounter: Payer: Self-pay | Admitting: Adult Health

## 2023-04-27 ENCOUNTER — Other Ambulatory Visit (HOSPITAL_COMMUNITY)
Admission: RE | Admit: 2023-04-27 | Discharge: 2023-04-27 | Disposition: A | Discharge status: Home / Self Care | Payer: Medicaid Other | Source: Ambulatory Visit | Attending: Adult Health | Admitting: Adult Health

## 2023-04-27 VITALS — BP 144/94 | HR 73 | Ht <= 58 in | Wt 152.0 lb

## 2023-04-27 DIAGNOSIS — Z113 Encounter for screening for infections with a predominantly sexual mode of transmission: Secondary | ICD-10-CM | POA: Diagnosis not present

## 2023-04-27 DIAGNOSIS — I10 Essential (primary) hypertension: Secondary | ICD-10-CM

## 2023-04-27 DIAGNOSIS — N898 Other specified noninflammatory disorders of vagina: Secondary | ICD-10-CM | POA: Insufficient documentation

## 2023-04-27 NOTE — Progress Notes (Signed)
  Subjective:     Patient ID: Debra Tanner, female   DOB: December 27, 1980, 42 y.o.   MRN: [id number]  HPI Debra Tanner is a 42 year old black female,single, G2P1011, in for BP check, she did not take BP meds today. She is complaining of vaginal discharge with odor, too.      Component Value Date/Time   DIAGPAP  05/14/2022 1035    - Negative for intraepithelial lesion or malignancy (NILM)   DIAGPAP  01/25/2020 1428    - Negative for intraepithelial lesion or malignancy (NILM)   DIAGPAP  09/08/2018 0000    NEGATIVE FOR INTRAEPITHELIAL LESIONS OR MALIGNANCY.   DIAGPAP TRICHOMONAS VAGINALIS PRESENT. 09/08/2018 0000   HPVHIGH Negative 05/14/2022 1035   HPVHIGH Negative 01/25/2020 1428   ADEQPAP  05/14/2022 1035    Satisfactory for evaluation; transformation zone component ABSENT.   ADEQPAP  01/25/2020 1428    Satisfactory for evaluation; transformation zone component ABSENT.   ADEQPAP  09/08/2018 0000    Satisfactory for evaluation  endocervical/transformation zone component PRESENT.   PCP is Dr Adriana Simas  Review of Systems Vaginal discharge with odor Reviewed past medical,surgical, social and family history. Reviewed medications and allergies.     Objective:   Physical Exam BP (!) 144/94 (BP Location: Right Arm, Patient Position: Sitting, Cuff Size: Normal)   Pulse 73   Ht 4\' 9"  (1.448 m)   Wt 152 lb (68.9 kg)   LMP 04/22/2023   BMI 32.89 kg/m     Skin warm and dry.  Lungs: clear to ausculation bilaterally. Cardiovascular: regular rate and rhythm.  She declines exam, did self swab.  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 No    Does the patient's partner want to become pregnant in the next year? No    Would the patient like to discuss contraceptive options  today? No      Contraception Wrap Up   Current Method Oral Contraceptive    End Method Oral Contraceptive             Assessment:     1. Vaginal discharge CV swab sent for GC/CHL,trich,BV and yeast  - Cervicovaginal ancillary only( [place])  2. Vaginal odor CV swab sent  - Cervicovaginal ancillary only( [place])  3. Screening for STD (sexually transmitted disease) CV swab sent for GC/CHL and trich  4. Hypertension, unspecified type Take BP meds daily, lisinopril 5 mg 1 daily ,has refills Follow up in 3 months     Plan:    Follow up with PCP  Follow up in 3 months

## 2023-04-29 LAB — CERVICOVAGINAL ANCILLARY ONLY
Bacterial Vaginitis (gardnerella): NEGATIVE
Candida Glabrata: NEGATIVE
Candida Vaginitis: NEGATIVE
Chlamydia: NEGATIVE
Comment: NEGATIVE
Comment: NEGATIVE
Comment: NEGATIVE
Comment: NEGATIVE
Comment: NEGATIVE
Comment: NORMAL
Neisseria Gonorrhea: NEGATIVE
Trichomonas: NEGATIVE

## 2023-05-07 ENCOUNTER — Other Ambulatory Visit: Payer: Self-pay | Admitting: Family Medicine

## 2023-05-07 DIAGNOSIS — E119 Type 2 diabetes mellitus without complications: Secondary | ICD-10-CM

## 2023-05-11 ENCOUNTER — Other Ambulatory Visit: Payer: Self-pay | Admitting: Family Medicine

## 2023-05-11 ENCOUNTER — Telehealth: Payer: Self-pay | Admitting: Family Medicine

## 2023-05-11 DIAGNOSIS — E1165 Type 2 diabetes mellitus with hyperglycemia: Secondary | ICD-10-CM

## 2023-05-11 MED ORDER — LANTUS SOLOSTAR 100 UNIT/ML ~~LOC~~ SOPN: 18.0000 [IU] | PEN_INJECTOR | Freq: Every day | SUBCUTANEOUS | 3 refills | Status: DC

## 2023-05-11 NOTE — Telephone Encounter (Signed)
Patient requesting new prescription for her Lantus 18 units, she states you changed prescription at her last visit but didn't send in new prescription now she is out. [facility] scale

## 2023-05-12 NOTE — Telephone Encounter (Signed)
Cook, Jayce G, DO     Rx sent.   

## 2023-07-27 ENCOUNTER — Telehealth: Payer: Self-pay | Admitting: Family Medicine

## 2023-07-27 DIAGNOSIS — E1165 Type 2 diabetes mellitus with hyperglycemia: Secondary | ICD-10-CM

## 2023-07-27 MED ORDER — LANTUS SOLOSTAR 100 UNIT/ML ~~LOC~~ SOPN: 18.0000 [IU] | PEN_INJECTOR | Freq: Every day | SUBCUTANEOUS | 1 refills | Status: DC

## 2023-07-27 NOTE — Telephone Encounter (Signed)
Refill on  insulin glargine (LANTUS SOLOSTAR) 100 UNIT/ML Solostar Pen ,insulin Pen Needle (PEN NEEDLES) 32G X 6 MM MISC Send to [facility] [address]

## 2023-07-28 ENCOUNTER — Encounter: Payer: Self-pay | Admitting: Adult Health

## 2023-07-28 ENCOUNTER — Ambulatory Visit: Payer: Medicaid Other | Admitting: Adult Health

## 2023-07-28 VITALS — BP 134/87 | HR 70 | Ht <= 58 in | Wt 156.0 lb

## 2023-07-28 DIAGNOSIS — I10 Essential (primary) hypertension: Secondary | ICD-10-CM

## 2023-07-28 DIAGNOSIS — N920 Excessive and frequent menstruation with regular cycle: Secondary | ICD-10-CM | POA: Diagnosis not present

## 2023-07-28 MED ORDER — NORETHINDRONE 0.35 MG PO TABS: 1.0000 | ORAL_TABLET | Freq: Every day | ORAL | 11 refills | Status: DC

## 2023-07-28 NOTE — Progress Notes (Signed)
  Subjective:     Patient ID: Debra Tanner, female   DOB: 10-Jun-1981, 42 y.o.   MRN: [id number]  HPI Debra Tanner is a 42 year old black female,single, G2P1011, back in follow up on BP, she is taking BP meds every day. She needs refill on Micronor.     Component Value Date/Time   DIAGPAP  05/14/2022 1035    - Negative for intraepithelial lesion or malignancy (NILM)   DIAGPAP  01/25/2020 1428    - Negative for intraepithelial lesion or malignancy (NILM)   DIAGPAP  09/08/2018 0000    NEGATIVE FOR INTRAEPITHELIAL LESIONS OR MALIGNANCY.   DIAGPAP TRICHOMONAS VAGINALIS PRESENT. 09/08/2018 0000   HPVHIGH Negative 05/14/2022 1035   HPVHIGH Negative 01/25/2020 1428   ADEQPAP  05/14/2022 1035    Satisfactory for evaluation; transformation zone component ABSENT.   ADEQPAP  01/25/2020 1428    Satisfactory for evaluation; transformation zone component ABSENT.   ADEQPAP  09/08/2018 0000    Satisfactory for evaluation  endocervical/transformation zone component PRESENT.    PCP is Dr Adriana Simas. Review of Systems Periods not has heavy on micronor Reviewed past medical,surgical, social and family history. Reviewed medications and allergies.     Objective:   Physical Exam BP 134/87 (BP Location: Left Arm, Patient Position: Sitting, Cuff Size: Normal)   Pulse 70   Ht 4\' 9"  (1.448 m)   Wt 156 lb (70.8 kg)   LMP 07/07/2023   BMI 33.76 kg/m     Skin warm and dry.  Lungs: clear to ausculation bilaterally. Cardiovascular: regular rate and rhythm.  Fall risk is moderate  Upstream - 07/28/23 1619       Pregnancy Intention Screening   Does the patient want to become pregnant in the next year? No    Does the patient's partner want to become pregnant in the next year? No    Would the patient like to discuss contraceptive options today? No      Contraception Wrap Up   Current Method Abstinence;Oral Contraceptive    End Method Abstinence;Oral Contraceptive    Contraception Counseling Provided Yes              Assessment:     1. Hypertension, unspecified type BP is better Continue taking lisinopril 5 mg every day, has refills  2. Menorrhagia with regular cycle Periods not as heavy, will refill micronor Meds ordered this encounter  Medications   norethindrone (MICRONOR) 0.35 MG tablet    Sig: Take 1 tablet (0.35 mg total) by mouth daily.    Dispense:  28 tablet    Refill:  11    Order Specific Question:   Supervising Provider    Answer:   Lazaro Arms [[id number]]       Plan:     Follow up in 6 months for ROS or sooner if needed

## 2023-08-06 ENCOUNTER — Other Ambulatory Visit: Payer: Self-pay | Admitting: Family Medicine

## 2023-08-06 DIAGNOSIS — E119 Type 2 diabetes mellitus without complications: Secondary | ICD-10-CM

## 2023-08-09 ENCOUNTER — Ambulatory Visit: Payer: Medicaid Other | Admitting: Family Medicine

## 2023-08-09 ENCOUNTER — Encounter: Payer: Self-pay | Admitting: Family Medicine

## 2023-08-09 VITALS — BP 134/88 | HR 69 | Temp 98.4°F | Ht <= 58 in | Wt 155.0 lb

## 2023-08-09 DIAGNOSIS — I10 Essential (primary) hypertension: Secondary | ICD-10-CM

## 2023-08-09 DIAGNOSIS — E538 Deficiency of other specified B group vitamins: Secondary | ICD-10-CM

## 2023-08-09 DIAGNOSIS — D500 Iron deficiency anemia secondary to blood loss (chronic): Secondary | ICD-10-CM

## 2023-08-09 DIAGNOSIS — E119 Type 2 diabetes mellitus without complications: Secondary | ICD-10-CM | POA: Diagnosis not present

## 2023-08-09 DIAGNOSIS — E786 Lipoprotein deficiency: Secondary | ICD-10-CM

## 2023-08-09 MED ORDER — LISINOPRIL 5 MG PO TABS: 5.0000 mg | ORAL_TABLET | Freq: Every day | ORAL | 3 refills | Status: DC

## 2023-08-09 NOTE — Assessment & Plan Note (Signed)
At goal. Continue lisinopril.

## 2023-08-09 NOTE — Patient Instructions (Signed)
Labs today.  Continue your medications.  Follow up in 6 months.

## 2023-08-09 NOTE — Assessment & Plan Note (Signed)
Awaiting A1c.  Continue insulin and metformin.

## 2023-08-09 NOTE — Progress Notes (Signed)
Subjective:  Patient ID: Debra Tanner, female    DOB: 30-Dec-1980  Age: 42 y.o. MRN: [id number]  CC: Chief Complaint  Patient presents with   Diabetes    And 6 month follow up - reports FBS of 130 to 140"s     HPI:  42 year old female with hypertension, type 2 diabetes, iron deficiency anemia secondary to chronic blood loss presents for follow-up.  Patient states that her fasting blood sugars are typically in the 130s to 140s.  She needs an updated A1c.  She is currently on 18 units of Lantus and metformin 1000 mg twice daily.  Hypertension stable on lisinopril.  Patient Active Problem List   Diagnosis Date Noted   Fibroid 03/16/2023   Menorrhagia with regular cycle 03/16/2023   DUB (dysfunctional uterine bleeding) 08/31/2022   Iron deficiency anemia due to chronic blood loss 08/28/2022   Hypertension 11/07/2019   Fibroids, subserous 09/19/2018   Diabetes mellitus without complication (HCC) 03/05/2017   GAD (generalized anxiety disorder) 03/05/2017   History of learning disability 03/05/2017   Obesity (BMI 30-39.9) 03/05/2017    Social Hx   Social History   Socioeconomic History   Marital status: Single    Spouse name: Not on file   Number of children: 1   Years of education: 12   Highest education level: Not on file  Occupational History   Occupation: disabled    Comment: Mental  Tobacco Use   Smoking status: Never    Passive exposure: Current   Smokeless tobacco: Never  Vaping Use   Vaping status: Never Used  Substance and Sexual Activity   Alcohol use: Not Currently   Drug use: Not Currently    Types: Marijuana   Sexual activity: Not Currently    Birth control/protection: Condom, Pill, Abstinence  Other Topics Concern   Not on file  Social History Narrative   Raised by grandmother   Lives with grandmother and son   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   Social Determinants of Health   Financial Resource Strain: Low Risk   (05/14/2022)   Overall Financial Resource Strain (CARDIA)    Difficulty of Paying Living Expenses: Not hard at all  Food Insecurity: Food Insecurity Present (05/14/2022)   Hunger Vital Sign    Worried About Running Out of Food in the Last Year: Sometimes true    Ran Out of Food in the Last Year: Sometimes true  Transportation Needs: No Transportation Needs (05/14/2022)   PRAPARE - Administrator, Civil Service (Medical): No    Lack of Transportation (Non-Medical): No  Physical Activity: Patient Declined (05/14/2022)   Exercise Vital Sign    Days of Exercise per Week: Patient declined    Minutes of Exercise per Session: Patient declined  Stress: Stress Concern Present (05/14/2022)   [employer] of Occupational Health - Occupational Stress Questionnaire    Feeling of Stress : To some extent  Social Connections: Unknown (05/14/2022)   Social Connection and Isolation Panel [NHANES]    Frequency of Communication with Friends and Family: Three times a week    Frequency of Social Gatherings with Friends and Family: Three times a week    Attends Religious Services: 1 to 4 times per year    Active Member of Clubs or Organizations: No    Attends Banker Meetings: Never    Marital Status: Patient declined    Review of Systems  Constitutional: Negative.     Objective:  BP 134/88   Pulse 69   Temp 98.4 F (36.9 C)   Ht 4\' 9"  (1.448 m)   Wt 155 lb (70.3 kg)   LMP 07/07/2023   SpO2 99%   BMI 33.54 kg/m      08/09/2023    1:35 PM 07/28/2023    4:14 PM 04/27/2023    4:17 PM  BP/Weight  Systolic BP 134 134 144  Diastolic BP 88 87 94  Wt. (Lbs) 155 156   BMI 33.54 kg/m2 33.76 kg/m2     Physical Exam Vitals and nursing note reviewed.  Constitutional:      General: She is not in acute distress. HENT:     Head: Normocephalic and atraumatic.  Cardiovascular:     Rate and Rhythm: Normal rate and regular rhythm.  Pulmonary:     Effort: Pulmonary effort is  normal.     Breath sounds: Normal breath sounds. No wheezing or rales.  Neurological:     Mental Status: She is alert.  Psychiatric:        Mood and Affect: Mood normal.        Behavior: Behavior normal.     Lab Results  Component Value Date   WBC 8.7 02/16/2023   HGB 12.5 02/16/2023   HCT 40.1 02/16/2023   PLT 426 02/16/2023   GLUCOSE 283 (H) 02/16/2023   CHOL 119 02/16/2023   TRIG 121 02/16/2023   HDL 37 (L) 02/16/2023   LDLCALC 60 02/16/2023   ALT 9 02/16/2023   AST 12 02/16/2023   NA 137 02/16/2023   K 4.4 02/16/2023   CL 99 02/16/2023   CREATININE 0.76 02/16/2023   BUN 8 02/16/2023   CO2 20 02/16/2023   TSH 1.46 05/08/2021   HGBA1C 8.1 (H) 02/16/2023   MICROALBUR 0.7 06/24/2020     Assessment & Plan:   Problem List Items Addressed This Visit       Cardiovascular and Mediastinum   Hypertension - Primary    At goal.  Continue lisinopril.      Relevant Medications   lisinopril (ZESTRIL) 5 MG tablet     Endocrine   Diabetes mellitus without complication (HCC)    Awaiting A1c.  Continue insulin and metformin.       Relevant Medications   lisinopril (ZESTRIL) 5 MG tablet   Other Relevant Orders   CMP14+EGFR   Hemoglobin A1c     Other   Iron deficiency anemia due to chronic blood loss   Relevant Orders   CBC   Iron, TIBC and Ferritin Panel   Other Visit Diagnoses     Low HDL (under 40)       Relevant Orders   Lipid panel   Vitamin B12 deficiency       Relevant Orders   Vitamin B12       Meds ordered this encounter  Medications   lisinopril (ZESTRIL) 5 MG tablet    Sig: Take 1 tablet (5 mg total) by mouth daily. For blood pressure and kidneys.    Dispense:  90 tablet    Refill:  3    Follow-up:  6 months  Casmir Auguste Adriana Simas DO [facility]

## 2023-08-10 ENCOUNTER — Other Ambulatory Visit: Payer: Self-pay | Admitting: Family Medicine

## 2023-08-10 LAB — CBC
Hematocrit: 34.8 % (ref 34.0–46.6)
Hemoglobin: 10.8 g/dL — ABNORMAL LOW (ref 11.1–15.9)
MCH: 27.8 pg (ref 26.6–33.0)
MCHC: 31 g/dL — ABNORMAL LOW (ref 31.5–35.7)
MCV: 90 fL (ref 79–97)
Platelets: 432 10*3/uL (ref 150–450)
RBC: 3.89 x10E6/uL (ref 3.77–5.28)
RDW: 12 % (ref 11.7–15.4)
WBC: 9.8 10*3/uL (ref 3.4–10.8)

## 2023-08-10 LAB — LIPID PANEL
Chol/HDL Ratio: 3.1 ratio (ref 0.0–4.4)
Cholesterol, Total: 110 mg/dL (ref 100–199)
HDL: 36 mg/dL — ABNORMAL LOW (ref >39)
LDL Chol Calc (NIH): 56 mg/dL (ref 0–99)
Triglycerides: 95 mg/dL (ref 0–149)
VLDL Cholesterol Cal: 18 mg/dL (ref 5–40)

## 2023-08-10 LAB — CMP14+EGFR
ALT: 9 IU/L (ref 0–32)
AST: 10 IU/L (ref 0–40)
Albumin: 4.1 g/dL (ref 3.9–4.9)
Alkaline Phosphatase: 82 IU/L (ref 44–121)
BUN/Creatinine Ratio: 8 — ABNORMAL LOW (ref 9–23)
BUN: 5 mg/dL — ABNORMAL LOW (ref 6–24)
Bilirubin Total: 0.4 mg/dL (ref 0.0–1.2)
CO2: 20 mmol/L (ref 20–29)
Calcium: 9.7 mg/dL (ref 8.7–10.2)
Chloride: 103 mmol/L (ref 96–106)
Creatinine, Ser: 0.64 mg/dL (ref 0.57–1.00)
Globulin, Total: 2.9 g/dL (ref 1.5–4.5)
Glucose: 204 mg/dL — ABNORMAL HIGH (ref 70–99)
Potassium: 4.8 mmol/L (ref 3.5–5.2)
Sodium: 139 mmol/L (ref 134–144)
Total Protein: 7 g/dL (ref 6.0–8.5)
eGFR: 114 mL/min/{1.73_m2} (ref >59)

## 2023-08-10 LAB — IRON,TIBC AND FERRITIN PANEL
Ferritin: 10 ng/mL — ABNORMAL LOW (ref 15–150)
Iron Saturation: 8 % — CL (ref 15–55)
Iron: 30 ug/dL (ref 27–159)
Total Iron Binding Capacity: 385 ug/dL (ref 250–450)
UIBC: 355 ug/dL (ref 131–425)

## 2023-08-10 LAB — HEMOGLOBIN A1C
Est. average glucose Bld gHb Est-mCnc: 148 mg/dL
Hgb A1c MFr Bld: 6.8 % — ABNORMAL HIGH (ref 4.8–5.6)

## 2023-08-10 LAB — VITAMIN B12: Vitamin B-12: 342 pg/mL (ref 232–1245)

## 2023-08-10 MED ORDER — IRON (FERROUS SULFATE) 325 (65 FE) MG PO TABS: 325.0000 mg | ORAL_TABLET | ORAL | 1 refills | Status: DC

## 2023-08-12 ENCOUNTER — Other Ambulatory Visit: Payer: Self-pay

## 2023-08-12 MED ORDER — IRON (FERROUS SULFATE) 325 (65 FE) MG PO TABS: 325.0000 mg | ORAL_TABLET | ORAL | 1 refills | Status: DC

## 2023-09-09 ENCOUNTER — Other Ambulatory Visit: Payer: Self-pay | Admitting: Family Medicine

## 2023-09-09 DIAGNOSIS — E119 Type 2 diabetes mellitus without complications: Secondary | ICD-10-CM

## 2023-11-07 ENCOUNTER — Emergency Department (HOSPITAL_COMMUNITY)
Admission: EM | Admit: 2023-11-07 | Discharge: 2023-11-07 | Disposition: A | Discharge status: Home / Self Care | Payer: Medicaid Other | Attending: Emergency Medicine | Admitting: Emergency Medicine

## 2023-11-07 ENCOUNTER — Encounter (HOSPITAL_COMMUNITY): Payer: Self-pay

## 2023-11-07 DIAGNOSIS — D649 Anemia, unspecified: Secondary | ICD-10-CM | POA: Diagnosis not present

## 2023-11-07 DIAGNOSIS — N898 Other specified noninflammatory disorders of vagina: Secondary | ICD-10-CM | POA: Insufficient documentation

## 2023-11-07 DIAGNOSIS — Z91040 Latex allergy status: Secondary | ICD-10-CM | POA: Insufficient documentation

## 2023-11-07 DIAGNOSIS — I10 Essential (primary) hypertension: Secondary | ICD-10-CM | POA: Insufficient documentation

## 2023-11-07 DIAGNOSIS — E119 Type 2 diabetes mellitus without complications: Secondary | ICD-10-CM | POA: Insufficient documentation

## 2023-11-07 DIAGNOSIS — Z79899 Other long term (current) drug therapy: Secondary | ICD-10-CM | POA: Diagnosis not present

## 2023-11-07 DIAGNOSIS — Z7984 Long term (current) use of oral hypoglycemic drugs: Secondary | ICD-10-CM | POA: Insufficient documentation

## 2023-11-07 DIAGNOSIS — Z794 Long term (current) use of insulin: Secondary | ICD-10-CM | POA: Diagnosis not present

## 2023-11-07 DIAGNOSIS — N939 Abnormal uterine and vaginal bleeding, unspecified: Secondary | ICD-10-CM | POA: Diagnosis not present

## 2023-11-07 LAB — CBC
HCT: 33.7 % — ABNORMAL LOW (ref 36.0–46.0)
Hemoglobin: 10.6 g/dL — ABNORMAL LOW (ref 12.0–15.0)
MCH: 28.8 pg (ref 26.0–34.0)
MCHC: 31.5 g/dL (ref 30.0–36.0)
MCV: 91.6 fL (ref 80.0–100.0)
Platelets: 305 10*3/uL (ref 150–400)
RBC: 3.68 MIL/uL — ABNORMAL LOW (ref 3.87–5.11)
RDW: 15.1 % (ref 11.5–15.5)
WBC: 8 10*3/uL (ref 4.0–10.5)
nRBC: 0 % (ref 0.0–0.2)

## 2023-11-07 LAB — WET PREP, GENITAL
Clue Cells Wet Prep HPF POC: NONE SEEN
Sperm: NONE SEEN
Trich, Wet Prep: NONE SEEN
WBC, Wet Prep HPF POC: <10 (ref <10)
Yeast Wet Prep HPF POC: NONE SEEN

## 2023-11-07 LAB — POC URINE PREG, ED: Preg Test, Ur: NEGATIVE

## 2023-11-07 NOTE — ED Provider Notes (Signed)
[place] EMERGENCY DEPARTMENT AT [facility] Provider Note   CSN: [id number] Arrival date & time: 11/07/23  2102     History {Add pertinent medical, surgical, social history, OB history to HPI:1} Chief Complaint  Patient presents with   Vaginal Bleeding    Debra Tanner is a 42 y.o. female with PMH as listed below who presents with VB.  Pt reports she is on day 5 of her menstrual cycle and she is having large clots which is not normal for her. Pt reports she is concerned she is bleeding too heavily. Per chart review she has been seen for menorrhagia in the past with OB/Gyn and prescribed micronor which she is still taking. She also has diagnosis of fibroids. She is G1P1. No lightheadedness, CP, SOB, DOE. Has had some mild to moderate lower abdominal cramping. Endorses some amount abnormal vaginal discharge but mostly is concerned with bleeding and the clots she has passed. No tissue seen.   Past Medical History:  Diagnosis Date   Anemia    Depression    Diabetes mellitus without complication (HCC)    Encounter for gynecological examination with Papanicolaou smear of cervix 09/08/2018   Essential hypertension, benign 11/07/2019   GAD (generalized anxiety disorder)    Hypertension    Trichimoniasis        Home Medications Prior to Admission medications   Medication Sig Start Date End Date Taking? Authorizing Provider  Acetaminophen (TYLENOL PO) Take by mouth.    [provider]  Glucose Blood (BLOOD GLUCOSE TEST STRIPS) STRP Use QID as directed to check blood sugar 02/26/23   Campbell Riches, NP  ibuprofen (ADVIL) 200 MG tablet Take 200 mg by mouth as needed.    [provider]  insulin glargine (LANTUS SOLOSTAR) 100 UNIT/ML Solostar Pen Inject 18 Units into the skin daily. 07/27/23   Tommie Sams, DO  Insulin Pen Needle (PEN NEEDLES) 32G X 6 MM MISC Use with Lantus Solostar 03/25/23   Tommie Sams, DO  Iron, Ferrous Sulfate, 325 (65 Fe) MG TABS  Take 325 mg by mouth every other day. 08/12/23   Tommie Sams, DO  Lancets Thin MISC Use QID to test glucose 02/26/23   Campbell Riches, NP  lisinopril (ZESTRIL) 5 MG tablet Take 1 tablet (5 mg total) by mouth daily. For blood pressure and kidneys. 08/09/23   Tommie Sams, DO  metFORMIN (GLUCOPHAGE) 1000 MG tablet TAKE 1 TABLET BY MOUTH TWICE DAILY WITH A MEAL 09/09/23   Cook, Jayce G, DO  norethindrone (MICRONOR) 0.35 MG tablet Take 1 tablet (0.35 mg total) by mouth daily. 07/28/23   Adline Potter, NP      Allergies    Latex    Review of Systems   Review of Systems A 10 point review of systems was performed and is negative unless otherwise reported in HPI.  Physical Exam Updated Vital Signs BP (!) 158/90 (BP Location: Right Arm)   Pulse 85   Temp 98.1 F (36.7 C) (Oral)   Resp 16   Ht 4\' 9"  (1.448 m)   Wt 70.3 kg   LMP 11/02/2023   SpO2 98%   BMI 33.54 kg/m  Physical Exam General: Normal appearing female, lying in bed.  HEENT: Sclera anicteric, MMM, trachea midline.  Cardiology: RRR, no murmurs/rubs/gallops.  Resp: Normal respiratory rate and effort. CTAB, no wheezes, rhonchi, crackles.  Abd: Soft, non-tender, non-distended. No rebound tenderness or guarding.  Pelvic: Performed with RN chaperone. ***  MSK: No peripheral edema or signs of trauma.  Back: No CVA tenderness Neuro: A&Ox4, CNs II-XII grossly intact. MAEs. Sensation grossly intact.  Psych: Normal mood and affect.   ED Results / Procedures / Treatments   Labs (all labs ordered are listed, but only abnormal results are displayed) Labs Reviewed  CBC - Abnormal; Notable for the following components:      Result Value   RBC 3.68 (*)    Hemoglobin 10.6 (*)    HCT 33.7 (*)    All other components within normal limits  POC URINE PREG, ED    EKG None  Radiology No results found.  Procedures Procedures  {Document cardiac monitor, telemetry assessment procedure when appropriate:1}  Medications Ordered  in ED Medications - No data to display  ED Course/ Medical Decision Making/ A&P                          Medical Decision Making Amount and/or Complexity of Data Reviewed Labs: ordered. Decision-making details documented in ED Course.    This patient presents to the ED for concern of ***, this involves an extensive number of treatment options, and is a complaint that carries with it a high risk of complications and morbidity.  I considered the following differential and admission for this acute, potentially life threatening condition.   MDM:    In nonpregnant females, vaginal bleeding consider:  Structural causes such as polyps, adenomyosis, leiomyomas, malignancy/hyperplasia Nonstructural causes such as coagulopathies, ovulatory dysfunction, endometrial bleeding, iatrogenic causes  Menorrhagia: >7 day (prolonged) or >80 mL/day (excessive) uterine bleeding at regular intervals  Metrorrhagia: irregular vaginal bleeding outside the normal cycle  Menometrorrhagia: excessive irregular vaginal bleeding   Clinical Course as of 11/07/23 2250  Sun Nov 07, 2023  2201 Hemoglobin(!): 10.6 Stable from 3 months ago [HN]  2201 Preg Test, Ur: NEGATIVE [HN]    Clinical Course User Index [HN] Loetta Rough, MD    Labs: I Ordered, and personally interpreted labs.  The pertinent results include:  ***  Imaging Studies ordered: I ordered imaging studies including *** I independently visualized and interpreted imaging. I agree with the radiologist interpretation  Additional history obtained from ***.  External records from outside source obtained and reviewed including ***  Cardiac Monitoring: The patient was maintained on a cardiac monitor.  I personally viewed and interpreted the cardiac monitored which showed an underlying rhythm of: ***  Reevaluation: After the interventions noted above, I reevaluated the patient and found that they have  :{resolved/improved/worsened:23923::"improved"}  Social Determinants of Health: ***  Disposition:  ***  Co morbidities that complicate the patient evaluation  Past Medical History:  Diagnosis Date   Anemia    Depression    Diabetes mellitus without complication (HCC)    Encounter for gynecological examination with Papanicolaou smear of cervix 09/08/2018   Essential hypertension, benign 11/07/2019   GAD (generalized anxiety disorder)    Hypertension    Trichimoniasis      Medicines No orders of the defined types were placed in this encounter.   I have reviewed the patients home medicines and have made adjustments as needed  Problem List / ED Course: Problem List Items Addressed This Visit   None        {Document critical care time when appropriate:1} {Document review of labs and clinical decision tools ie heart score, Chads2Vasc2 etc:1}  {Document your independent review of radiology images, and any outside records:1} {Document your discussion with  family members, caretakers, and with consultants:1} {Document social determinants of health affecting pt's care:1} {Document your decision making why or why not admission, treatments were needed:1}  This note was created using dictation software, which may contain spelling or grammatical errors.

## 2023-11-07 NOTE — Discharge Instructions (Addendum)
Thank you for coming to [facility] Emergency Department. You were seen for abnormal vaginal bleeding. We did an exam, labs, and these showed moderate but not emergent bleeding. Your hemoglobin was mildly low but stable from priors.  Please follow up with your gynecologist. You can call them tomorrow morning to schedule an appointment for this week.  Do not hesitate to return to the ED or call 911 if you experience: -Worsening symptoms -Saturating >3 pads or tampons per hour for 2 hours -Severe abdominal pain -Shortness of breath -Lightheadedness, passing out -Fevers/chills -Anything else that concerns you

## 2023-11-07 NOTE — ED Triage Notes (Signed)
Pt reports she is on day 5 of her menstrual cycle and she is having large clots which is not normal for her.  Pt reports she is concerned she is bleeding to heavily.

## 2023-11-07 NOTE — ED Notes (Signed)
Pelvic cart at bedside. Lab at bedside currently.

## 2023-11-09 ENCOUNTER — Other Ambulatory Visit: Payer: Self-pay | Admitting: Family Medicine

## 2023-11-09 ENCOUNTER — Other Ambulatory Visit: Payer: Self-pay | Admitting: Adult Health

## 2023-11-09 ENCOUNTER — Telehealth: Payer: Self-pay | Admitting: *Deleted

## 2023-11-09 LAB — GC/CHLAMYDIA PROBE AMP (~~LOC~~) NOT AT ARMC
Chlamydia: NEGATIVE
Comment: NEGATIVE
Comment: NORMAL
Neisseria Gonorrhea: NEGATIVE

## 2023-11-09 MED ORDER — IBUPROFEN 800 MG PO TABS: 800.0000 mg | ORAL_TABLET | Freq: Three times a day (TID) | ORAL | 0 refills | Status: DC | PRN

## 2023-11-09 NOTE — Telephone Encounter (Signed)
Everlene Other G, DO     Please ask her to reach out to GYN.

## 2023-11-09 NOTE — Telephone Encounter (Signed)
Patient would like something for pain/cramping- has follow up with GYN

## 2023-11-09 NOTE — Telephone Encounter (Unsigned)
Copied from CRM [id number]. Topic: Clinical - Medical Advice >> Nov 09, 2023  8:32 AM Herbert Seta B wrote: Reason for CRM: Patient calling because was seen in ER 12/15 for abnormal vaginal bleeding, but is now experiencing cramping pains. Would like pain medicine or to know if needs another appointment, has 12/31 with [facility].

## 2023-11-09 NOTE — Progress Notes (Signed)
Rx motrin 800 mg 1 every 8 hours prn cramps

## 2023-11-10 NOTE — Telephone Encounter (Signed)
Tommie Sams, DO     Prescription Ibuprofen was sent by GYN.

## 2023-11-23 ENCOUNTER — Encounter: Payer: Self-pay | Admitting: Adult Health

## 2023-11-23 ENCOUNTER — Ambulatory Visit: Payer: Medicaid Other | Admitting: Adult Health

## 2023-11-23 VITALS — BP 161/102 | HR 81 | Ht <= 58 in | Wt 154.0 lb

## 2023-11-23 DIAGNOSIS — D219 Benign neoplasm of connective and other soft tissue, unspecified: Secondary | ICD-10-CM | POA: Diagnosis not present

## 2023-11-23 DIAGNOSIS — I10 Essential (primary) hypertension: Secondary | ICD-10-CM | POA: Diagnosis not present

## 2023-11-23 DIAGNOSIS — N920 Excessive and frequent menstruation with regular cycle: Secondary | ICD-10-CM

## 2023-11-23 DIAGNOSIS — N938 Other specified abnormal uterine and vaginal bleeding: Secondary | ICD-10-CM

## 2023-11-23 MED ORDER — LISINOPRIL 10 MG PO TABS: 10.0000 mg | ORAL_TABLET | Freq: Every day | ORAL | 6 refills | Status: DC

## 2023-11-23 NOTE — Progress Notes (Signed)
  Subjective:     Patient ID: Debra Tanner, female   DOB: Jan 13, 1981, 42 y.o.   MRN: [id number]  HPI Debra Tanner is a 42 year old black female, single, G2P1011, in for follow up after being seen in ER at [facility] 11/07/23 for bleeding with clots. She is on micronor  and still spotting. GC/CHL was negative, and HGB was 10.6.     Component Value Date/Time   DIAGPAP  05/14/2022 1035    - Negative for intraepithelial lesion or malignancy (NILM)   DIAGPAP  01/25/2020 1428    - Negative for intraepithelial lesion or malignancy (NILM)   DIAGPAP  09/08/2018 0000    NEGATIVE FOR INTRAEPITHELIAL LESIONS OR MALIGNANCY.   DIAGPAP TRICHOMONAS VAGINALIS PRESENT. 09/08/2018 0000   HPVHIGH Negative 05/14/2022 1035   HPVHIGH Negative 01/25/2020 1428   ADEQPAP  05/14/2022 1035    Satisfactory for evaluation; transformation zone component ABSENT.   ADEQPAP  01/25/2020 1428    Satisfactory for evaluation; transformation zone component ABSENT.   ADEQPAP  09/08/2018 0000    Satisfactory for evaluation  endocervical/transformation zone component PRESENT.   PCP is Dr Bluford   Review of Systems Spotting Denies any pain Not currently having sex Reviewed past medical,surgical, social and family history. Reviewed medications and allergies.     Objective:   Physical Exam BP (!) 161/102 (BP Location: Right Arm, Patient Position: Sitting, Cuff Size: Normal)   Pulse 81   Ht 4' 9 (1.448 m)   Wt 154 lb (69.9 kg)   LMP 11/03/2023   BMI 33.33 kg/m     Skin warm and dry.  Lungs: clear to ausculation bilaterally. Cardiovascular: regular rate and rhythm. Decliens exam  Upstream - 11/23/23 1135       Pregnancy Intention Screening   Does the patient want to become pregnant in the next year? No    Does the patient's partner want to become pregnant in the next year? No    Would the patient like to discuss contraceptive options today? No      Contraception Wrap Up   Current Method Abstinence;Oral Contraceptive     End Method Abstinence;Oral Contraceptive    Contraception Counseling Provided Yes             Assessment:     1. Menorrhagia with regular cycle (Primary) Continue Micronor  - US  PELVIC COMPLETE WITH TRANSVAGINAL; Future  2. DUB (dysfunctional uterine bleeding) Spotting now Will get pelvic US  to assess uterus and ovaries, has know fibroid  - US  PELVIC COMPLETE WITH TRANSVAGINAL; Future  3. Fibroid - US  PELVIC COMPLETE WITH TRANSVAGINAL; Future  4. Hypertension, unspecified type Will increase lisinopril  to 10 mg 1 daily, can take to 5 mg that has at home and ne rx sent Meds ordered this encounter  Medications   lisinopril  (ZESTRIL ) 10 MG tablet    Sig: Take 1 tablet (10 mg total) by mouth daily.    Dispense:  30 tablet    Refill:  6    Supervising Provider:   JAYNE VONN DEL [[id number]]   Will recheck in 2 weeks     Plan:     Return in about 2 weeks for pelvic and US  and see me for BP check and review US

## 2023-12-08 ENCOUNTER — Other Ambulatory Visit: Payer: Medicaid Other | Admitting: Radiology

## 2023-12-08 ENCOUNTER — Encounter: Payer: Self-pay | Admitting: Adult Health

## 2023-12-08 ENCOUNTER — Other Ambulatory Visit: Payer: Self-pay | Admitting: Adult Health

## 2023-12-08 ENCOUNTER — Ambulatory Visit: Payer: Medicaid Other | Admitting: Radiology

## 2023-12-08 ENCOUNTER — Ambulatory Visit: Payer: Medicaid Other | Admitting: Adult Health

## 2023-12-08 VITALS — BP 138/89 | HR 71 | Ht <= 58 in | Wt 154.5 lb

## 2023-12-08 DIAGNOSIS — D219 Benign neoplasm of connective and other soft tissue, unspecified: Secondary | ICD-10-CM | POA: Diagnosis not present

## 2023-12-08 DIAGNOSIS — N938 Other specified abnormal uterine and vaginal bleeding: Secondary | ICD-10-CM

## 2023-12-08 DIAGNOSIS — I10 Essential (primary) hypertension: Secondary | ICD-10-CM

## 2023-12-08 DIAGNOSIS — N920 Excessive and frequent menstruation with regular cycle: Secondary | ICD-10-CM | POA: Diagnosis not present

## 2023-12-08 NOTE — Progress Notes (Signed)
  Subjective:     Patient ID: Debra Tanner, female   DOB: 03-Mar-1981, 43 y.o.   MRN: [id number]  HPI Debra Tanner is a 43 year old black female,single, G2P1011, in for BP check and had US  today, will review. Has had no bleeding since December, is taking micronor  and lisinopril  every day.     Component Value Date/Time   DIAGPAP  05/14/2022 1035    - Negative for intraepithelial lesion or malignancy (NILM)   DIAGPAP  01/25/2020 1428    - Negative for intraepithelial lesion or malignancy (NILM)   DIAGPAP  09/08/2018 0000    NEGATIVE FOR INTRAEPITHELIAL LESIONS OR MALIGNANCY.   DIAGPAP TRICHOMONAS VAGINALIS PRESENT. 09/08/2018 0000   HPVHIGH Negative 05/14/2022 1035   HPVHIGH Negative 01/25/2020 1428   ADEQPAP  05/14/2022 1035    Satisfactory for evaluation; transformation zone component ABSENT.   ADEQPAP  01/25/2020 1428    Satisfactory for evaluation; transformation zone component ABSENT.   ADEQPAP  09/08/2018 0000    Satisfactory for evaluation  endocervical/transformation zone component PRESENT.    PCP is Dr Debrah Fan  Review of Systems No bleeding since December  Reviewed past medical,surgical, social and family history. Reviewed medications and allergies.     Objective:   Physical Exam BP 138/89 (BP Location: Right Arm, Patient Position: Sitting, Cuff Size: Normal)   Pulse 71   Ht 4\' 9"  (1.448 m)   Wt 154 lb 8 oz (70.1 kg)   LMP 11/03/2023   BMI 33.43 kg/m     Skin warm and dry.  Lungs: clear to ausculation bilaterally. Cardiovascular: regular rate and rhythm.   Upstream - 12/08/23 1542       Pregnancy Intention Screening   Does the patient want to become pregnant in the next year? No    Does the patient's partner want to become pregnant in the next year? No    Would the patient like to discuss contraceptive options today? No      Contraception Wrap Up   Current Method Oral Contraceptive    End Method Oral Contraceptive    Contraception Counseling Provided Yes             US  showed multiple fibroids with enlarged uterus, all fibroids 4 cm or less, endometrial thickness 8.6 mm and ovaries are normal. Assessment:     1. Hypertension, unspecified type (Primary) Continue lisinopril  10 mg 1 daily has refills   2. Fibroid Has multiple fibroids   3. DUB (dysfunctional uterine bleeding) Bleeding has stopped, continue Micronor , has refills      Plan:     Return 1/223/25 to discuss options with fibroids

## 2023-12-08 NOTE — Progress Notes (Signed)
 US :   TA and TV images obtained, vinyl probe cover used, Chaperone: Quila Anteverted uterus slightly enlarged with multiple intramural fibroids <=4 cm Endometrium ~ 8.6 mm in thickness, no definite intracavitary mass seen but poor resolution due to multiple fibroids.  Normal ovaries - neg adnexal regions - neg CDS - no free fluid present

## 2023-12-16 ENCOUNTER — Ambulatory Visit: Payer: Medicaid Other | Admitting: Obstetrics & Gynecology

## 2023-12-16 ENCOUNTER — Encounter: Payer: Self-pay | Admitting: Obstetrics & Gynecology

## 2023-12-16 VITALS — BP 159/87 | HR 71 | Ht <= 58 in

## 2023-12-16 DIAGNOSIS — N938 Other specified abnormal uterine and vaginal bleeding: Secondary | ICD-10-CM | POA: Diagnosis not present

## 2023-12-16 DIAGNOSIS — I10 Essential (primary) hypertension: Secondary | ICD-10-CM

## 2023-12-16 DIAGNOSIS — D219 Benign neoplasm of connective and other soft tissue, unspecified: Secondary | ICD-10-CM | POA: Diagnosis not present

## 2023-12-16 DIAGNOSIS — D500 Iron deficiency anemia secondary to blood loss (chronic): Secondary | ICD-10-CM | POA: Diagnosis not present

## 2023-12-16 MED ORDER — MEDROXYPROGESTERONE ACETATE 5 MG PO TABS: 5.0000 mg | ORAL_TABLET | Freq: Every day | ORAL | 11 refills | Status: DC

## 2023-12-16 NOTE — Progress Notes (Signed)
   GYN VISIT Patient name: Debra Tanner MRN [id number]  Date of birth: Aug 05, 1981 Chief Complaint:   Fibroids  History of Present Illness:   Debra Tanner is a 43 y.o. G40P1011 female being seen today for the following concerns:  She is concerned about her fibroids and asking about removal.  Today she notes that pill has not been working well for her.  It seems like her menses are irregular- mostly monthly, but slightly longer and passing quarter-sized clots.   Menses used to be for 5 days now lasting 8 days and it seems like she will have Light spotting that continues for a few days maybe a week after her period.  Due to [facility]- options for management limited to progesterone-only options.  Bleeding has led to iron def. Anemia- currently on oral iron supplementation.  Reviewed recent US- Uterus with multiple fibroids- stable in size- largest 4x4cm.  Patient's last menstrual period was 11/03/2023.    Review of Systems:   Pertinent items are noted in HPI Denies fever/chills, dizziness, headaches, visual disturbances, fatigue, shortness of breath, chest pain, abdominal pain, vomiting. Pertinent History Reviewed:  No past surgical history on file.  Past Medical History:  Diagnosis Date   Anemia    Depression    Diabetes mellitus without complication (HCC)    Encounter for gynecological examination with Papanicolaou smear of cervix 09/08/2018   Essential hypertension, benign 11/07/2019   GAD (generalized anxiety disorder)    Hypertension    Trichimoniasis    Reviewed problem list, medications and allergies. Physical Assessment:   Vitals:   12/16/23 1356  BP: (!) 159/87  Pulse: 71  Height: 4\' 9"  (1.448 m)  Body mass index is 33.43 kg/m.       Physical Examination:   General appearance: alert, well appearing, and in no distress  Psych: mood appropriate, normal affect  Skin: warm & dry   Cardiovascular: normal heart rate noted  Respiratory: normal respiratory effort,  no distress  Abdomen: soft, non-tender   Pelvic: examination not indicated  Extremities: no edema   Chaperone: N/A    Assessment & Plan:  1) AUB, Uterine fibroids, Anemia -Long discussion with patient regarding recent ultrasound, findings of fibroids and management of AUB/heavy menses -Reassured patient that fibroids are apically not cancerous -Discussed management options ranging from medications to surgical  intervention.   -Reviewed risk benefit of each of each option and discussed when/why they are indicated  -Discussed with patient that many people find good control of their symptoms with medications alone -After reviewing questions and concerns, patient is agreeable to try higher dose of progesterone -She will plan to do some research on her own regarding her future options -Plan to follow-up in 3 months  2) Chronic HTN -pt taking Lisinopril -f/u with PCP  Meds ordered this encounter  Medications   medroxyPROGESTERone (PROVERA) 5 MG tablet    Sig: Take 1 tablet (5 mg total) by mouth daily.    Dispense:  30 tablet    Refill:  11     Return in about 3 months (around 03/15/2024) for Medication follow up.   Debra Hidalgo, DO Attending Obstetrician & Gynecologist, [facility] for [employer], [facility]

## 2023-12-25 ENCOUNTER — Other Ambulatory Visit: Payer: Self-pay | Admitting: Family Medicine

## 2023-12-25 DIAGNOSIS — E1165 Type 2 diabetes mellitus with hyperglycemia: Secondary | ICD-10-CM

## 2023-12-27 ENCOUNTER — Other Ambulatory Visit: Payer: Self-pay

## 2023-12-27 DIAGNOSIS — E1165 Type 2 diabetes mellitus with hyperglycemia: Secondary | ICD-10-CM

## 2023-12-27 MED ORDER — LANTUS SOLOSTAR 100 UNIT/ML ~~LOC~~ SOPN: 18.0000 [IU] | PEN_INJECTOR | Freq: Every day | SUBCUTANEOUS | 1 refills | Status: DC

## 2024-02-07 ENCOUNTER — Encounter: Payer: Self-pay | Admitting: Family Medicine

## 2024-02-07 ENCOUNTER — Ambulatory Visit (INDEPENDENT_AMBULATORY_CARE_PROVIDER_SITE_OTHER): Payer: Medicaid Other | Admitting: Family Medicine

## 2024-02-07 DIAGNOSIS — E786 Lipoprotein deficiency: Secondary | ICD-10-CM | POA: Diagnosis not present

## 2024-02-07 DIAGNOSIS — Z794 Long term (current) use of insulin: Secondary | ICD-10-CM | POA: Diagnosis not present

## 2024-02-07 DIAGNOSIS — I10 Essential (primary) hypertension: Secondary | ICD-10-CM | POA: Diagnosis not present

## 2024-02-07 DIAGNOSIS — D500 Iron deficiency anemia secondary to blood loss (chronic): Secondary | ICD-10-CM

## 2024-02-07 DIAGNOSIS — E119 Type 2 diabetes mellitus without complications: Secondary | ICD-10-CM | POA: Diagnosis not present

## 2024-02-07 DIAGNOSIS — E1169 Type 2 diabetes mellitus with other specified complication: Secondary | ICD-10-CM | POA: Diagnosis not present

## 2024-02-07 DIAGNOSIS — Z7984 Long term (current) use of oral hypoglycemic drugs: Secondary | ICD-10-CM | POA: Diagnosis not present

## 2024-02-07 NOTE — Patient Instructions (Addendum)
Labs today.  Continue your medications.  Follow up in 6 months.

## 2024-02-08 ENCOUNTER — Encounter: Payer: Self-pay | Admitting: Family Medicine

## 2024-02-08 LAB — CMP14+EGFR
ALT: 10 IU/L (ref 0–32)
AST: 11 IU/L (ref 0–40)
Albumin: 3.9 g/dL (ref 3.9–4.9)
Alkaline Phosphatase: 71 IU/L (ref 44–121)
BUN/Creatinine Ratio: 9 (ref 9–23)
BUN: 6 mg/dL (ref 6–24)
Bilirubin Total: <0.2 mg/dL (ref 0.0–1.2)
CO2: 22 mmol/L (ref 20–29)
Calcium: 8.9 mg/dL (ref 8.7–10.2)
Chloride: 105 mmol/L (ref 96–106)
Creatinine, Ser: 0.66 mg/dL (ref 0.57–1.00)
Globulin, Total: 2.6 g/dL (ref 1.5–4.5)
Glucose: 182 mg/dL — ABNORMAL HIGH (ref 70–99)
Potassium: 4.1 mmol/L (ref 3.5–5.2)
Sodium: 138 mmol/L (ref 134–144)
Total Protein: 6.5 g/dL (ref 6.0–8.5)
eGFR: 112 mL/min/{1.73_m2} (ref >59)

## 2024-02-08 LAB — LIPID PANEL
Chol/HDL Ratio: 3.3 ratio (ref 0.0–4.4)
Cholesterol, Total: 100 mg/dL (ref 100–199)
HDL: 30 mg/dL — ABNORMAL LOW (ref >39)
LDL Chol Calc (NIH): 49 mg/dL (ref 0–99)
Triglycerides: 112 mg/dL (ref 0–149)
VLDL Cholesterol Cal: 21 mg/dL (ref 5–40)

## 2024-02-08 LAB — IRON,TIBC AND FERRITIN PANEL
Ferritin: 24 ng/mL (ref 15–150)
Iron Saturation: 5 % — CL (ref 15–55)
Iron: 18 ug/dL — ABNORMAL LOW (ref 27–159)
Total Iron Binding Capacity: 358 ug/dL (ref 250–450)
UIBC: 340 ug/dL (ref 131–425)

## 2024-02-08 LAB — MICROALBUMIN / CREATININE URINE RATIO
Creatinine, Urine: 181.9 mg/dL
Microalb/Creat Ratio: 19 mg/g{creat} (ref 0–29)
Microalbumin, Urine: 35.3 ug/mL

## 2024-02-08 LAB — HEMOGLOBIN A1C
Est. average glucose Bld gHb Est-mCnc: 123 mg/dL
Hgb A1c MFr Bld: 5.9 % — ABNORMAL HIGH (ref 4.8–5.6)

## 2024-02-08 LAB — CBC
Hematocrit: 34.4 % (ref 34.0–46.6)
Hemoglobin: 10.6 g/dL — ABNORMAL LOW (ref 11.1–15.9)
MCH: 27.1 pg (ref 26.6–33.0)
MCHC: 30.8 g/dL — ABNORMAL LOW (ref 31.5–35.7)
MCV: 88 fL (ref 79–97)
Platelets: 388 10*3/uL (ref 150–450)
RBC: 3.91 x10E6/uL (ref 3.77–5.28)
RDW: 12.3 % (ref 11.7–15.4)
WBC: 5.2 10*3/uL (ref 3.4–10.8)

## 2024-02-08 MED ORDER — LISINOPRIL 10 MG PO TABS: 10.0000 mg | ORAL_TABLET | Freq: Every day | ORAL | 3 refills | Status: DC

## 2024-02-08 MED ORDER — METFORMIN HCL 1000 MG PO TABS: 1000.0000 mg | ORAL_TABLET | Freq: Two times a day (BID) | ORAL | 3 refills | Status: DC

## 2024-02-08 MED ORDER — IRON (FERROUS SULFATE) 325 (65 FE) MG PO TABS: 325.0000 mg | ORAL_TABLET | ORAL | 1 refills | Status: DC

## 2024-02-08 NOTE — Assessment & Plan Note (Signed)
 Patient remains anemic.  Hemoglobin is stable.  This is secondary to iron deficiency.  Restarting iron therapy.

## 2024-02-08 NOTE — Assessment & Plan Note (Signed)
 A1c returned at 5.9.  Continue current medications.  Diabetic foot exam performed today.  Needs eye exam.

## 2024-02-08 NOTE — Assessment & Plan Note (Signed)
 BP mildly elevated today.  Continue lisinopril.  Will continue to monitor.

## 2024-02-08 NOTE — Progress Notes (Signed)
 Subjective:  Patient ID: Debra Tanner, female    DOB: 28-Feb-1981  Age: 43 y.o. MRN: [id number]  CC:   Chief Complaint  Patient presents with   Diabetes   Hypertension    HPI:  43 year old female presents for follow-up.  BP mildly elevated here today.  She is on lisinopril 10 mg daily.  Type 2 diabetes had stable on Lantus and metformin.  Needs A1c today.  Needs foot exam today.  Patient in need of eye exam.  No chest pain or shortness of breath.  Patient states that she feels like she is getting a cold.  Patient Active Problem List   Diagnosis Date Noted   Iron deficiency anemia due to chronic blood loss 08/28/2022   Hypertension 11/07/2019   Fibroids, subserous 09/19/2018   Diabetes mellitus without complication (HCC) 03/05/2017   GAD (generalized anxiety disorder) 03/05/2017   History of learning disability 03/05/2017   Obesity (BMI 30-39.9) 03/05/2017    Social Hx   Social History   Socioeconomic History   Marital status: Single    Spouse name: Not on file   Number of children: 1   Years of education: 12   Highest education level: Not on file  Occupational History   Occupation: disabled    Comment: Mental  Tobacco Use   Smoking status: Never    Passive exposure: Current   Smokeless tobacco: Never  Vaping Use   Vaping status: Never Used  Substance and Sexual Activity   Alcohol use: Not Currently   Drug use: Not Currently    Types: Marijuana   Sexual activity: Not Currently    Birth control/protection: Condom, Pill  Other Topics Concern   Not on file  Social History Narrative   Raised by grandmother   Lives with grandmother and son   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   Social Drivers of Health   Financial Resource Strain: Low Risk  (05/14/2022)   Overall Financial Resource Strain (CARDIA)    Difficulty of Paying Living Expenses: Not hard at all  Food Insecurity: Food Insecurity Present (05/14/2022)   Hunger Vital Sign     Worried About Running Out of Food in the Last Year: Sometimes true    Ran Out of Food in the Last Year: Sometimes true  Transportation Needs: No Transportation Needs (05/14/2022)   PRAPARE - Administrator, Civil Service (Medical): No    Lack of Transportation (Non-Medical): No  Physical Activity: Patient Declined (05/14/2022)   Exercise Vital Sign    Days of Exercise per Week: Patient declined    Minutes of Exercise per Session: Patient declined  Stress: Stress Concern Present (05/14/2022)   [employer] of Occupational Health - Occupational Stress Questionnaire    Feeling of Stress : To some extent  Social Connections: Unknown (05/14/2022)   Social Connection and Isolation Panel [NHANES]    Frequency of Communication with Friends and Family: Three times a week    Frequency of Social Gatherings with Friends and Family: Three times a week    Attends Religious Services: 1 to 4 times per year    Active Member of Clubs or Organizations: No    Attends Banker Meetings: Never    Marital Status: Patient declined    Review of Systems Per HPI  Objective:  BP (!) 134/94   Pulse 88   Temp 98.1 F (36.7 C)   Ht 4\' 9"  (1.448 m)   Wt 151 lb (68.5 kg)  SpO2 99%   BMI 32.68 kg/m      02/07/2024    2:45 PM 02/07/2024    2:24 PM 12/16/2023    1:56 PM  BP/Weight  Systolic BP 134 149 159  Diastolic BP 94 95 87  Wt. (Lbs)  151   BMI  32.68 kg/m2     Physical Exam Vitals and nursing note reviewed.  Constitutional:      General: She is not in acute distress.    Appearance: Normal appearance.  HENT:     Head: Normocephalic and atraumatic.  Eyes:     General:        Right eye: No discharge.        Left eye: No discharge.     Conjunctiva/sclera: Conjunctivae normal.  Cardiovascular:     Rate and Rhythm: Normal rate and regular rhythm.  Pulmonary:     Effort: Pulmonary effort is normal.     Breath sounds: Normal breath sounds. No wheezing, rhonchi or  rales.  Feet:     Comments: Diabetic foot exam performed today.  See quality metrics section. Neurological:     Mental Status: She is alert.  Psychiatric:        Mood and Affect: Mood normal.        Behavior: Behavior normal.     Lab Results  Component Value Date   WBC 5.2 02/07/2024   HGB 10.6 (L) 02/07/2024   HCT 34.4 02/07/2024   PLT 388 02/07/2024   GLUCOSE 182 (H) 02/07/2024   CHOL 100 02/07/2024   TRIG 112 02/07/2024   HDL 30 (L) 02/07/2024   LDLCALC 49 02/07/2024   ALT 10 02/07/2024   AST 11 02/07/2024   NA 138 02/07/2024   K 4.1 02/07/2024   CL 105 02/07/2024   CREATININE 0.66 02/07/2024   BUN 6 02/07/2024   CO2 22 02/07/2024   TSH 1.46 05/08/2021   HGBA1C 5.9 (H) 02/07/2024   MICROALBUR 0.7 06/24/2020     Assessment & Plan:  Iron deficiency anemia due to chronic blood loss Assessment & Plan: Patient remains anemic.  Hemoglobin is stable.  This is secondary to iron deficiency.  Restarting iron therapy.  Orders: -     CBC -     Iron, TIBC and Ferritin Panel  Diabetes mellitus without complication (HCC) Assessment & Plan: A1c returned at 5.9.  Continue current medications.  Diabetic foot exam performed today.  Needs eye exam.  Orders: -     CMP14+EGFR -     Microalbumin / creatinine urine ratio -     Hemoglobin A1c -     metFORMIN HCl; Take 1 tablet (1,000 mg total) by mouth 2 (two) times daily with a meal.  Dispense: 180 tablet; Refill: 3  Low HDL (under 40) -     Lipid panel  Hypertension, unspecified type Assessment & Plan: BP mildly elevated today.  Continue lisinopril.  Will continue to monitor.   Other orders -     Lisinopril; Take 1 tablet (10 mg total) by mouth daily.  Dispense: 90 tablet; Refill: 3 -     Iron (Ferrous Sulfate); Take 325 mg by mouth every other day.  Dispense: 45 tablet; Refill: 1    Follow-up: 6 months  Erice Ahles Adriana Simas DO [facility]

## 2024-03-10 ENCOUNTER — Other Ambulatory Visit: Payer: Self-pay

## 2024-03-10 ENCOUNTER — Telehealth: Payer: Self-pay

## 2024-03-10 MED ORDER — BLOOD GLUCOSE TEST VI STRP: ORAL_STRIP | 5 refills | Status: AC

## 2024-03-10 NOTE — Telephone Encounter (Signed)
 Prescription Request  03/10/2024  LOV: Visit date not found  What is the name of the medication or equipment? Glucose Blood (BLOOD GLUCOSE TEST STRIPS) STRP Lancets Thin MISC   Have you contacted your pharmacy to request a refill? Yes   Which pharmacy would you like this sent to?  [id number] - [place], [place] - [address] [place] [address] Phone: [phone] Fax: [phone]    Patient notified that their request is being sent to the clinical staff for review and that they should receive a response within 2 business days.   Please advise at Mobile [phone] (mobile)

## 2024-03-16 ENCOUNTER — Other Ambulatory Visit: Payer: Self-pay | Admitting: Family Medicine

## 2024-03-21 ENCOUNTER — Other Ambulatory Visit: Payer: Self-pay

## 2024-03-21 MED ORDER — LANCETS THIN MISC: 5 refills | Status: AC

## 2024-05-04 ENCOUNTER — Ambulatory Visit: Admitting: Obstetrics & Gynecology

## 2024-05-04 ENCOUNTER — Encounter: Payer: Self-pay | Admitting: Obstetrics & Gynecology

## 2024-05-04 VITALS — BP 149/93 | HR 77 | Ht <= 58 in | Wt 156.0 lb

## 2024-05-04 DIAGNOSIS — D219 Benign neoplasm of connective and other soft tissue, unspecified: Secondary | ICD-10-CM | POA: Diagnosis not present

## 2024-05-04 DIAGNOSIS — I10 Essential (primary) hypertension: Secondary | ICD-10-CM

## 2024-05-04 DIAGNOSIS — D500 Iron deficiency anemia secondary to blood loss (chronic): Secondary | ICD-10-CM

## 2024-05-04 DIAGNOSIS — N939 Abnormal uterine and vaginal bleeding, unspecified: Secondary | ICD-10-CM

## 2024-05-04 NOTE — Progress Notes (Signed)
   GYN VISIT Patient name: Debra Tanner MRN [id number]  Date of birth: December 24, 1980 Chief Complaint:   Follow-up  History of Present Illness:   Debra Tanner is a 43 y.o. G17P1011 female being seen today for follow up regarding:  AUB:    At her last visit she was transition to Provera  5 mg daily.  It is difficult to assess whether the bleeding has improved or not.  She notes that she has having more clots than before, but states the bleeding is no longer flowing.  She seems to be using less pads than previously.  Denies pelvic or abdominal pain.  Notes no other acute GYN concerns Normal period in March Not sure about April Then in May menses seemed to be a bit longer.  Intermittent bleeding x 2 weeks.    Patient was asked overall that she feel like this bleeding was better or not and it seems as though she truly was not sure No LMP recorded. (Menstrual status: Oral contraceptives).    Review of Systems:   Pertinent items are noted in HPI Denies fever/chills, dizziness, headaches, visual disturbances, fatigue, shortness of breath, chest pain, abdominal pain, vomiting. Pertinent History Reviewed:  No past surgical history on file.  Past Medical History:  Diagnosis Date   Anemia    Depression    Diabetes mellitus without complication (HCC)    Encounter for gynecological examination with Papanicolaou smear of cervix 09/08/2018   Essential hypertension, benign 11/07/2019   Fibroid 03/16/2023   GAD (generalized anxiety disorder)    Hypertension    Trichimoniasis    Reviewed problem list, medications and allergies. Physical Assessment:   Vitals:   05/04/24 1540 05/04/24 1613  BP: (!) 154/93 (!) 149/93  Pulse: 77   Weight: 156 lb (70.8 kg)   Height: 4' 9 (1.448 m)   Body mass index is 33.76 kg/m.       Physical Examination:   General appearance: alert, well appearing, and in no distress  Psych: mood appropriate, normal affect  Skin: warm & dry   Cardiovascular: normal  heart rate noted  Respiratory: normal respiratory effort, no distress  Abdomen: soft, non-tender, no rebound, no guarding  Pelvic: examination not indicated  Extremities: no edema   Chaperone: N/A    Prior US  reviewed: Uterus with multiple fibroids- largest 4x4cm. 254cc volume    Assessment & Plan:  1) AUB - After much discussion regarding continuing current medication, changed to a different type of medication or consideration for surgical management.  Patient did not seem ready yet to make any sort of change - Encourage patient to track menses to have a better objective idea as to whether or not the bleeding has significantly improved - Reviewed lab work recent hemoglobin in March 2025 still anemic Hgb 10.6 - Will plan for now to continue with current management as she desired to give it a bit more time and will follow-up in September should she note heavy menses or desire to proceed with an alternative option patient to return to clinic sooner  2) chronic HTN -pt to f/u with PCP   No orders of the defined types were placed in this encounter.   Return for September medication follow up, with Dr. Saburo Luger.   Sherica Paternostro, DO Attending Obstetrician & Gynecologist, [facility] for [employer], [facility]

## 2024-07-02 ENCOUNTER — Other Ambulatory Visit: Payer: Self-pay | Admitting: Family Medicine

## 2024-07-02 DIAGNOSIS — E1165 Type 2 diabetes mellitus with hyperglycemia: Secondary | ICD-10-CM

## 2024-07-05 ENCOUNTER — Other Ambulatory Visit: Payer: Self-pay | Admitting: Family Medicine

## 2024-07-05 DIAGNOSIS — E119 Type 2 diabetes mellitus without complications: Secondary | ICD-10-CM

## 2024-07-05 MED ORDER — BLOOD GLUCOSE TEST VI STRP: 1.0000 | ORAL_STRIP | Freq: Three times a day (TID) | 3 refills | Status: AC

## 2024-07-05 MED ORDER — BLOOD GLUCOSE MONITORING SUPPL DEVI: 1.0000 | Freq: Three times a day (TID) | 1 refills | Status: AC

## 2024-07-05 MED ORDER — LANCET DEVICE MISC: 1.0000 | Freq: Three times a day (TID) | 1 refills | Status: AC

## 2024-07-05 MED ORDER — LANCETS MISC. MISC: 1.0000 | Freq: Three times a day (TID) | 3 refills | Status: AC

## 2024-07-18 ENCOUNTER — Ambulatory Visit: Admitting: Obstetrics & Gynecology

## 2024-07-18 ENCOUNTER — Encounter: Payer: Self-pay | Admitting: Obstetrics & Gynecology

## 2024-07-18 VITALS — BP 165/106 | HR 73 | Wt 158.0 lb

## 2024-07-18 DIAGNOSIS — N946 Dysmenorrhea, unspecified: Secondary | ICD-10-CM

## 2024-07-18 DIAGNOSIS — N921 Excessive and frequent menstruation with irregular cycle: Secondary | ICD-10-CM

## 2024-07-18 DIAGNOSIS — D250 Submucous leiomyoma of uterus: Secondary | ICD-10-CM | POA: Diagnosis not present

## 2024-07-18 DIAGNOSIS — D500 Iron deficiency anemia secondary to blood loss (chronic): Secondary | ICD-10-CM

## 2024-07-18 LAB — POCT HEMOGLOBIN: Hemoglobin: 11.1 g/dL (ref 11–14.6)

## 2024-07-18 MED ORDER — KETOROLAC TROMETHAMINE 10 MG PO TABS: 10.0000 mg | ORAL_TABLET | Freq: Three times a day (TID) | ORAL | 0 refills | Status: DC | PRN

## 2024-07-18 MED ORDER — MEGESTROL ACETATE 40 MG PO TABS
ORAL_TABLET | ORAL | 2 refills | Status: DC
Start: 2024-07-18 — End: 2024-07-19

## 2024-07-18 NOTE — Addendum Note (Signed)
 Addended by: ILEAN RUTHERFORD HERO on: 07/18/2024 04:08 PM   Modules accepted: Orders

## 2024-07-18 NOTE — Progress Notes (Signed)
 Chief Complaint  Patient presents with   Irregular bleeding w/ clots      43 y.o. G2P1011 Patient's last menstrual period was 06/30/2024. The current method of family planning is none.  Outpatient Encounter Medications as of 07/18/2024  Medication Sig   Acetaminophen  (TYLENOL  PO) Take by mouth.   BD PEN NEEDLE MICRO U/F 32G X 6 MM MISC USE WITH LANTUS  INSULIN  ONCE DAILY   Blood Glucose Monitoring Suppl DEVI 1 each by Other route in the morning, at noon, and at bedtime. May substitute to any manufacturer covered by patient's insurance.   Glucose Blood (BLOOD GLUCOSE TEST STRIPS) STRP Use QID as directed to check blood sugar   Glucose Blood (BLOOD GLUCOSE TEST STRIPS) STRP 1 each by Other route in the morning, at noon, and at bedtime. May substitute to any manufacturer covered by patient's insurance.   ibuprofen  (ADVIL ) 800 MG tablet Take 1 tablet (800 mg total) by mouth every 8 (eight) hours as needed.   Iron , Ferrous Sulfate , 325 (65 Fe) MG TABS Take 325 mg by mouth every other day.   ketorolac  (TORADOL ) 10 MG tablet Take 1 tablet (10 mg total) by mouth every 8 (eight) hours as needed.   Lancet Device MISC 1 each by Other route in the morning, at noon, and at bedtime. May substitute to any manufacturer covered by patient's insurance.   Lancets Misc. MISC 1 each by Other route in the morning, at noon, and at bedtime. May substitute to any manufacturer covered by patient's insurance.   Lancets Thin MISC Use QID to test glucose   LANTUS  SOLOSTAR 100 UNIT/ML Solostar Pen ADMINISTER 18 UNITS UNDER THE SKIN DAILY   lisinopril  (ZESTRIL ) 10 MG tablet Take 1 tablet (10 mg total) by mouth daily.   megestrol  (MEGACE ) 40 MG tablet 3 tablets a day   metFORMIN  (GLUCOPHAGE ) 1000 MG tablet Take 1 tablet (1,000 mg total) by mouth 2 (two) times daily with a meal.   medroxyPROGESTERone  (PROVERA ) 5 MG tablet Take 1 tablet (5 mg total) by mouth daily. (Patient not taking: Reported on 07/18/2024)   No  facility-administered encounter medications on file as of 07/18/2024.    Subjective Pt with multiple submucosal fibroids causing long term menometrorrhagia and dymenorrhea Sonogram reviewed images with patient Will try to temporize with megestrol  but recommend proceeding with RA TLH + BS  Past Medical History:  Diagnosis Date   Anemia    Depression    Diabetes mellitus without complication (HCC)    Encounter for gynecological examination with Papanicolaou smear of cervix 09/08/2018   Essential hypertension, benign 11/07/2019   Fibroid 03/16/2023   GAD (generalized anxiety disorder)    Hypertension    Trichimoniasis     No past surgical history on file.  OB History     Gravida  2   Para  1   Term  1   Preterm      AB  1   Living  1      SAB  1   IAB      Ectopic      Multiple      Live Births              Allergies  Allergen Reactions   Latex Rash    Social History   Socioeconomic History   Marital status: Single    Spouse name: Not on file   Number of children: 1   Years of education: 12   Highest  education level: Not on file  Occupational History   Occupation: disabled    Comment: Mental  Tobacco Use   Smoking status: Never    Passive exposure: Current   Smokeless tobacco: Never  Vaping Use   Vaping status: Never Used  Substance and Sexual Activity   Alcohol use: Not Currently   Drug use: Not Currently    Types: Marijuana   Sexual activity: Not Currently    Birth control/protection: Condom, Pill  Other Topics Concern   Not on file  Social History Narrative   Raised by grandmother   Lives with grandmother and son   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   Social Drivers of Health   Financial Resource Strain: Low Risk  (05/14/2022)   Overall Financial Resource Strain (CARDIA)    Difficulty of Paying Living Expenses: Not hard at all  Food Insecurity: Food Insecurity Present (05/14/2022)   Hunger Vital Sign     Worried About Running Out of Food in the Last Year: Sometimes true    Ran Out of Food in the Last Year: Sometimes true  Transportation Needs: No Transportation Needs (05/14/2022)   PRAPARE - Administrator, Civil Service (Medical): No    Lack of Transportation (Non-Medical): No  Physical Activity: Patient Declined (05/14/2022)   Exercise Vital Sign    Days of Exercise per Week: Patient declined    Minutes of Exercise per Session: Patient declined  Stress: Stress Concern Present (05/14/2022)   [employer] of Occupational Health - Occupational Stress Questionnaire    Feeling of Stress : To some extent  Social Connections: Unknown (05/14/2022)   Social Connection and Isolation Panel    Frequency of Communication with Friends and Family: Three times a week    Frequency of Social Gatherings with Friends and Family: Three times a week    Attends Religious Services: 1 to 4 times per year    Active Member of Clubs or Organizations: No    Attends Banker Meetings: Never    Marital Status: Patient declined    Family History  Problem Relation Age of Onset   Alcohol abuse Mother    Cirrhosis Mother    Alcohol abuse Father    Heart disease Father    Hypertension Father    Diabetes Sister    Sickle cell trait Sister    Hypertension Maternal Grandmother    Hyperlipidemia Maternal Grandmother    Cancer Maternal Grandfather        lung   Cancer Paternal Grandfather    Hypertension Paternal Grandmother     Medications:       Current Outpatient Medications:    Acetaminophen  (TYLENOL  PO), Take by mouth., Disp: , Rfl:    BD PEN NEEDLE MICRO U/F 32G X 6 MM MISC, USE WITH LANTUS  INSULIN  ONCE DAILY, Disp: 100 each, Rfl: 2   Blood Glucose Monitoring Suppl DEVI, 1 each by Other route in the morning, at noon, and at bedtime. May substitute to any manufacturer covered by patient's insurance., Disp: 1 each, Rfl: 1   Glucose Blood (BLOOD GLUCOSE TEST STRIPS) STRP, Use QID as  directed to check blood sugar, Disp: 200 strip, Rfl: 5   Glucose Blood (BLOOD GLUCOSE TEST STRIPS) STRP, 1 each by Other route in the morning, at noon, and at bedtime. May substitute to any manufacturer covered by patient's insurance., Disp: 100 each, Rfl: 3   ibuprofen  (ADVIL ) 800 MG tablet, Take 1 tablet (800 mg total) by mouth  every 8 (eight) hours as needed., Disp: 60 tablet, Rfl: 0   Iron , Ferrous Sulfate , 325 (65 Fe) MG TABS, Take 325 mg by mouth every other day., Disp: 45 tablet, Rfl: 1   ketorolac  (TORADOL ) 10 MG tablet, Take 1 tablet (10 mg total) by mouth every 8 (eight) hours as needed., Disp: 15 tablet, Rfl: 0   Lancet Device MISC, 1 each by Other route in the morning, at noon, and at bedtime. May substitute to any manufacturer covered by patient's insurance., Disp: 1 each, Rfl: 1   Lancets Misc. MISC, 1 each by Other route in the morning, at noon, and at bedtime. May substitute to any manufacturer covered by patient's insurance., Disp: 100 each, Rfl: 3   Lancets Thin MISC, Use QID to test glucose, Disp: 200 each, Rfl: 5   LANTUS  SOLOSTAR 100 UNIT/ML Solostar Pen, ADMINISTER 18 UNITS UNDER THE SKIN DAILY, Disp: 15 mL, Rfl: 1   lisinopril  (ZESTRIL ) 10 MG tablet, Take 1 tablet (10 mg total) by mouth daily., Disp: 90 tablet, Rfl: 3   megestrol  (MEGACE ) 40 MG tablet, 3 tablets a day, Disp: 90 tablet, Rfl: 2   metFORMIN  (GLUCOPHAGE ) 1000 MG tablet, Take 1 tablet (1,000 mg total) by mouth 2 (two) times daily with a meal., Disp: 180 tablet, Rfl: 3   medroxyPROGESTERone  (PROVERA ) 5 MG tablet, Take 1 tablet (5 mg total) by mouth daily. (Patient not taking: Reported on 07/18/2024), Disp: 30 tablet, Rfl: 11  Objective Blood pressure (!) 165/106, pulse 73, weight 158 lb (71.7 kg), last menstrual period 06/30/2024.  WDWN NAD   Pertinent ROS No burning with urination, frequency or urgency No nausea, vomiting or diarrhea Nor fever chills or other constitutional symptoms   Labs or  studies Reviewed labs and scans    Impression + Management Plan: Diagnoses this Encounter::   ICD-10-CM   1. Submucous uterine fibroid  D25.0     2. Menometrorrhagia  N92.1     3. Dysmenorrhea  N94.6      Emma,   Please schedule Gwyn BIRCH Goel for surgery and contact her regarding pre op dates/times, etc::  Procedure:  robot assisted total laparoscopic hysterectomy with bilateral salpingectomy  Diagnosis:  fibroids menometrorrhagia dysmenorrhea anemia  Date or dates requested:  09/06/24 [facility]  Has [place] Medicaid, will sign papers today  Thanks,  Dr Jayne   Medications prescribed during  this encounter: Meds ordered this encounter  Medications   ketorolac  (TORADOL ) 10 MG tablet    Sig: Take 1 tablet (10 mg total) by mouth every 8 (eight) hours as needed.    Dispense:  15 tablet    Refill:  0   megestrol  (MEGACE ) 40 MG tablet    Sig: 3 tablets a day    Dispense:  90 tablet    Refill:  2    Labs or Scans Ordered during this encounter: No orders of the defined types were placed in this encounter.     Follow up No follow-ups on file.

## 2024-07-19 ENCOUNTER — Telehealth: Payer: Self-pay

## 2024-07-19 ENCOUNTER — Other Ambulatory Visit (HOSPITAL_COMMUNITY): Payer: Self-pay | Admitting: Obstetrics & Gynecology

## 2024-07-19 MED ORDER — MEGESTROL ACETATE 40 MG/ML PO SUSP: 120.0000 mg | Freq: Every day | ORAL | 3 refills | Status: DC

## 2024-07-19 NOTE — Telephone Encounter (Signed)
 Patient wants to know if she can get the liquid form on megestrol  called in, the pill form is out of stock.

## 2024-07-20 ENCOUNTER — Encounter: Payer: Self-pay | Admitting: Obstetrics & Gynecology

## 2024-07-27 ENCOUNTER — Telehealth: Payer: Self-pay

## 2024-07-27 ENCOUNTER — Telehealth: Payer: Self-pay | Admitting: Obstetrics & Gynecology

## 2024-07-27 NOTE — Telephone Encounter (Signed)
 Patient called regarding what to do about a medication prescribed by Dr Jayne on 8/26. Medication was to stop bleeding. Patient has not bled despite not taking the prescription. RN consulted with Dr Jayne and he suggests taking the medication to prevent bleeding until her hysterectomy on 10/15 with Dr Jayne. All questions answered.

## 2024-07-27 NOTE — Telephone Encounter (Signed)
 Patient called asked to speak to nurse about a rx asking for someone to call her back

## 2024-08-02 ENCOUNTER — Other Ambulatory Visit: Payer: Self-pay | Admitting: Physician Assistant

## 2024-08-02 DIAGNOSIS — E119 Type 2 diabetes mellitus without complications: Secondary | ICD-10-CM

## 2024-08-02 MED ORDER — DEXCOM G7 SENSOR MISC: 1.0000 | 5 refills | Status: DC

## 2024-08-02 NOTE — Telephone Encounter (Signed)
 Copied from CRM [id number]. Topic: Clinical - Medication Question >> Aug 02, 2024  2:06 PM Debra Tanner wrote: Reason for CRM: Patient asked if doctor could prescribe her the arm patches to monitor her blood sugar levels instead of poking her finger. Callback number is [phone]

## 2024-08-03 ENCOUNTER — Telehealth: Payer: Self-pay | Admitting: Pharmacy Technician

## 2024-08-03 ENCOUNTER — Other Ambulatory Visit (HOSPITAL_COMMUNITY): Payer: Self-pay

## 2024-08-03 NOTE — Telephone Encounter (Signed)
 Pharmacy Patient Advocate Encounter   Received notification from Patient Pharmacy that prior authorization for Dexcom G7 Sensor  is required/requested.   Insurance verification completed.   The patient is insured through [employer] .   Per test claim: PA required; PA started via CoverMyMeds. KEY [id number] . Please see clinical question(s) below that I am not finding the answer to in their chart and advise.  Patient will need an office visit to discuss the following and to document her current insulin  regimen. This is required to be documented and sent to the plan. Please advise.

## 2024-08-04 NOTE — Telephone Encounter (Signed)
 Grooms, Paton, PA-C      08/04/24  9:05 AM Yes. And upcoming appointment next week.

## 2024-08-09 ENCOUNTER — Ambulatory Visit: Admitting: Family Medicine

## 2024-08-09 ENCOUNTER — Encounter: Payer: Self-pay | Admitting: Family Medicine

## 2024-08-09 VITALS — BP 150/91 | HR 73 | Ht <= 58 in | Wt 156.0 lb

## 2024-08-09 DIAGNOSIS — D500 Iron deficiency anemia secondary to blood loss (chronic): Secondary | ICD-10-CM

## 2024-08-09 DIAGNOSIS — Z794 Long term (current) use of insulin: Secondary | ICD-10-CM

## 2024-08-09 DIAGNOSIS — D252 Subserosal leiomyoma of uterus: Secondary | ICD-10-CM

## 2024-08-09 DIAGNOSIS — Z23 Encounter for immunization: Secondary | ICD-10-CM | POA: Diagnosis not present

## 2024-08-09 DIAGNOSIS — E786 Lipoprotein deficiency: Secondary | ICD-10-CM | POA: Diagnosis not present

## 2024-08-09 DIAGNOSIS — E119 Type 2 diabetes mellitus without complications: Secondary | ICD-10-CM | POA: Diagnosis not present

## 2024-08-09 DIAGNOSIS — I10 Essential (primary) hypertension: Secondary | ICD-10-CM

## 2024-08-09 DIAGNOSIS — Z7984 Long term (current) use of oral hypoglycemic drugs: Secondary | ICD-10-CM

## 2024-08-09 MED ORDER — LISINOPRIL 20 MG PO TABS: 20.0000 mg | ORAL_TABLET | Freq: Every day | ORAL | 3 refills | Status: AC

## 2024-08-09 NOTE — Patient Instructions (Signed)
 Call [phone] to schedule mammogram.  Do your labs in ~ 1 week.  I have increased your BP medication.  Follow up in 2 weeks for recheck.

## 2024-08-10 ENCOUNTER — Other Ambulatory Visit (HOSPITAL_COMMUNITY): Payer: Self-pay

## 2024-08-10 MED ORDER — DEXCOM G7 SENSOR MISC
1.0000 | 5 refills | Status: DC
Start: 2024-08-10 — End: 2024-09-25

## 2024-08-10 NOTE — Assessment & Plan Note (Signed)
 Uncontrolled.  Increasing lisinopril .  Follow-up labs ordered.

## 2024-08-10 NOTE — Assessment & Plan Note (Signed)
 Surgery has been recommended.  I agree with this assessment.  Advised patient that she should be at no increased risk of complications provided that her blood pressure and blood sugars are well-controlled.

## 2024-08-10 NOTE — Progress Notes (Signed)
 Subjective:  Patient ID: Debra Tanner, female    DOB: [birth year]  Age: 43 y.o. MRN: [id number]  CC:   Chief Complaint  Patient presents with   6 month follow up    Discuss hysterectomy potential problems after having this done And diabetes     HPI:  43 year old female presents for follow-up.  Patient's blood pressure is uncontrolled.  She is currently on lisinopril  10 mg daily.  Patient is followed by OB/GYN.  Is been recommended that she have a hysterectomy due to submucosal fibroids which are causing long-term menometrorrhagia and dysmenorrhea.  Patient states that she is still on the fence about having surgery.  She is concerned about her risk especially given the fact that she is diabetic.  Last A1c was well-controlled at 5.9.  Patient is compliant with metformin  and Lantus .  She takes 18 units of Lantus .  Patient is interested in continuous glucometer.  Patient amenable to flu vaccine today.  Needs mammogram.  Needs pneumococcal vaccine.  Needs tetanus.  Needs an eye exam.  Patient Active Problem List   Diagnosis Date Noted   Iron  deficiency anemia due to chronic blood loss 08/28/2022   Hypertension 11/07/2019   Fibroids, subserous 09/19/2018   Diabetes mellitus without complication (HCC) 03/05/2017   GAD (generalized anxiety disorder) 03/05/2017   History of learning disability 03/05/2017   Obesity (BMI 30-39.9) 03/05/2017    Social Hx   Social History   Socioeconomic History   Marital status: Single    Spouse name: Not on file   Number of children: 1   Years of education: 12   Highest education level: Not on file  Occupational History   Occupation: disabled    Comment: Mental  Tobacco Use   Smoking status: Never    Passive exposure: Current   Smokeless tobacco: Never  Vaping Use   Vaping status: Never Used  Substance and Sexual Activity   Alcohol use: Not Currently   Drug use: Not Currently    Types: Marijuana   Sexual activity: Not Currently     Birth control/protection: Condom, Pill  Other Topics Concern   Not on file  Social History Narrative   Raised by grandmother   Lives with grandmother and son   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   Social Drivers of Health   Financial Resource Strain: Low Risk  (05/14/2022)   Overall Financial Resource Strain (CARDIA)    Difficulty of Paying Living Expenses: Not hard at all  Food Insecurity: Food Insecurity Present (05/14/2022)   Hunger Vital Sign    Worried About Running Out of Food in the Last Year: Sometimes true    Ran Out of Food in the Last Year: Sometimes true  Transportation Needs: No Transportation Needs (05/14/2022)   PRAPARE - Administrator, Civil Service (Medical): No    Lack of Transportation (Non-Medical): No  Physical Activity: Patient Declined (05/14/2022)   Exercise Vital Sign    Days of Exercise per Week: Patient declined    Minutes of Exercise per Session: Patient declined  Stress: Stress Concern Present (05/14/2022)   [employer] of Occupational Health - Occupational Stress Questionnaire    Feeling of Stress : To some extent  Social Connections: Unknown (05/14/2022)   Social Connection and Isolation Panel    Frequency of Communication with Friends and Family: Three times a week    Frequency of Social Gatherings with Friends and Family: Three times a week    Attends  Religious Services: 1 to 4 times per year    Active Member of Clubs or Organizations: No    Attends Banker Meetings: Never    Marital Status: Patient declined    Review of Systems Per HPI  Objective:  BP (!) 150/91   Pulse 73   Ht 4' 9 (1.448 m)   Wt 156 lb (70.8 kg)   LMP 06/30/2024   SpO2 97%   BMI 33.76 kg/m      08/09/2024    3:05 PM 08/09/2024    2:33 PM 07/18/2024    3:36 PM  BP/Weight  Systolic BP 150 145 165  Diastolic BP 91 90 106  Wt. (Lbs)  156   BMI  33.76 kg/m2     Physical Exam Constitutional:      General: She is  not in acute distress.    Appearance: Normal appearance.  HENT:     Head: Normocephalic and atraumatic.  Cardiovascular:     Rate and Rhythm: Normal rate and regular rhythm.  Pulmonary:     Effort: Pulmonary effort is normal.     Breath sounds: Normal breath sounds.  Neurological:     Mental Status: She is alert.     Lab Results  Component Value Date   WBC 5.2 02/07/2024   HGB 11.1 07/18/2024   HCT 34.4 02/07/2024   PLT 388 02/07/2024   GLUCOSE 182 (H) 02/07/2024   CHOL 100 02/07/2024   TRIG 112 02/07/2024   HDL 30 (L) 02/07/2024   LDLCALC 49 02/07/2024   ALT 10 02/07/2024   AST 11 02/07/2024   NA 138 02/07/2024   K 4.1 02/07/2024   CL 105 02/07/2024   CREATININE 0.66 02/07/2024   BUN 6 02/07/2024   CO2 22 02/07/2024   TSH 1.46 05/08/2021   HGBA1C 5.9 (H) 02/07/2024   MICROALBUR 0.7 06/24/2020     Assessment & Plan:  Diabetes mellitus without complication [facility]) Assessment & Plan: Has been well-controlled.  A1c ordered.  Continue Lantus  and metformin .  Continues glucometer reordered.  Orders: -     CMP14+EGFR -     Hemoglobin A1c -     Dexcom G7 Sensor; 1 each by Does not apply route every 14 (fourteen) days.  Dispense: 2 each; Refill: 5  Iron  deficiency anemia due to chronic blood loss -     CBC -     Iron , TIBC and Ferritin Panel  Low HDL (under 40) -     Lipid panel  Immunization due -     Flu vaccine trivalent PF, 6mos and older(Flulaval,Afluria,Fluarix,Fluzone)  Hypertension, unspecified type Assessment & Plan: Uncontrolled.  Increasing lisinopril .  Follow-up labs ordered.   Fibroids, subserous Assessment & Plan: Surgery has been recommended.  I agree with this assessment.  Advised patient that she should be at no increased risk of complications provided that her blood pressure and blood sugars are well-controlled.   Other orders -     Lisinopril ; Take 1 tablet (20 mg total) by mouth daily.  Dispense: 90 tablet; Refill: 3    Follow-up:   Return in about 2 weeks (around 08/23/2024) for HTN follow up.  Jacqulyn Ahle DO [facility]

## 2024-08-10 NOTE — Telephone Encounter (Signed)
 Pharmacy Patient Advocate Encounter  Received notification from [employer] that Prior Authorization for Dexcom G7 Sensor has been APPROVED from 08/10/2024 to 02/06/2025. Ran test claim, Copay is $0.00. This test claim was processed through [facility]- copay amounts may vary at other pharmacies due to pharmacy/plan contracts, or as the patient moves through the different stages of their insurance plan.   PA #/Case ID/Reference #: [id number]

## 2024-08-10 NOTE — Assessment & Plan Note (Signed)
 Has been well-controlled.  A1c ordered.  Continue Lantus  and metformin .  Continues glucometer reordered.

## 2024-08-10 NOTE — Telephone Encounter (Signed)
 PA sent and is pending.   Key# [facility]

## 2024-08-23 ENCOUNTER — Encounter: Payer: Self-pay | Admitting: Family Medicine

## 2024-08-23 ENCOUNTER — Ambulatory Visit (INDEPENDENT_AMBULATORY_CARE_PROVIDER_SITE_OTHER): Admitting: Family Medicine

## 2024-08-23 VITALS — BP 140/82 | Ht <= 58 in | Wt 157.0 lb

## 2024-08-23 DIAGNOSIS — I10 Essential (primary) hypertension: Secondary | ICD-10-CM

## 2024-08-23 NOTE — Patient Instructions (Signed)
 BP is okay. Keep a close eye.  Follow up in ~ 1 month.

## 2024-08-24 ENCOUNTER — Ambulatory Visit: Payer: Self-pay | Admitting: Family Medicine

## 2024-08-24 LAB — IRON,TIBC AND FERRITIN PANEL
Ferritin: 18 ng/mL (ref 15–150)
Iron Saturation: 30 % (ref 15–55)
Iron: 107 ug/dL (ref 27–159)
Total Iron Binding Capacity: 354 ug/dL (ref 250–450)
UIBC: 247 ug/dL (ref 131–425)

## 2024-08-24 LAB — CMP14+EGFR
ALT: 13 IU/L (ref 0–32)
AST: 10 IU/L (ref 0–40)
Albumin: 4.1 g/dL (ref 3.9–4.9)
Alkaline Phosphatase: 87 IU/L (ref 41–116)
BUN/Creatinine Ratio: 12 (ref 9–23)
BUN: 9 mg/dL (ref 6–24)
Bilirubin Total: 0.3 mg/dL (ref 0.0–1.2)
CO2: 21 mmol/L (ref 20–29)
Calcium: 9.8 mg/dL (ref 8.7–10.2)
Chloride: 104 mmol/L (ref 96–106)
Creatinine, Ser: 0.75 mg/dL (ref 0.57–1.00)
Globulin, Total: 2.9 g/dL (ref 1.5–4.5)
Glucose: 171 mg/dL — ABNORMAL HIGH (ref 70–99)
Potassium: 4.8 mmol/L (ref 3.5–5.2)
Sodium: 137 mmol/L (ref 134–144)
Total Protein: 7 g/dL (ref 6.0–8.5)
eGFR: 102 mL/min/1.73 (ref >59)

## 2024-08-24 LAB — LIPID PANEL
Chol/HDL Ratio: 3.3 ratio (ref 0.0–4.4)
Cholesterol, Total: 120 mg/dL (ref 100–199)
HDL: 36 mg/dL — ABNORMAL LOW (ref >39)
LDL Chol Calc (NIH): 66 mg/dL (ref 0–99)
Triglycerides: 96 mg/dL (ref 0–149)
VLDL Cholesterol Cal: 18 mg/dL (ref 5–40)

## 2024-08-24 LAB — HEMOGLOBIN A1C
Est. average glucose Bld gHb Est-mCnc: 140 mg/dL
Hgb A1c MFr Bld: 6.5 % — ABNORMAL HIGH (ref 4.8–5.6)

## 2024-08-24 LAB — CBC
Hematocrit: 38.5 % (ref 34.0–46.6)
Hemoglobin: 11.9 g/dL (ref 11.1–15.9)
MCH: 28.2 pg (ref 26.6–33.0)
MCHC: 30.9 g/dL — ABNORMAL LOW (ref 31.5–35.7)
MCV: 91 fL (ref 79–97)
Platelets: 379 x10E3/uL (ref 150–450)
RBC: 4.22 x10E6/uL (ref 3.77–5.28)
RDW: 13.1 % (ref 11.7–15.4)
WBC: 9.2 x10E3/uL (ref 3.4–10.8)

## 2024-08-24 NOTE — Assessment & Plan Note (Signed)
 Improved. Continue current dosing of Lisinopril . Labs have been ordered. Advised to complete labs today.

## 2024-08-24 NOTE — Progress Notes (Signed)
 Subjective:  Patient ID: Debra Tanner, female    DOB: 1981-03-06  Age: 43 y.o. MRN: [id number]  CC:   Chief Complaint  Patient presents with   Hypertension    HPI:  43 year old female presents for follow up regarding HTN.  BP improved from prior after increase in Lisinopril . She is overall doing well but expresses anxiety about upcoming surgery (hysterectomy).  Patient Active Problem List   Diagnosis Date Noted   Iron  deficiency anemia due to chronic blood loss 08/28/2022   Hypertension 11/07/2019   Fibroids, subserous 09/19/2018   Diabetes mellitus without complication (HCC) 03/05/2017   GAD (generalized anxiety disorder) 03/05/2017   History of learning disability 03/05/2017   Obesity (BMI 30-39.9) 03/05/2017    Social Hx   Social History   Socioeconomic History   Marital status: Single    Spouse name: Not on file   Number of children: 1   Years of education: 12   Highest education level: Not on file  Occupational History   Occupation: disabled    Comment: Mental  Tobacco Use   Smoking status: Never    Passive exposure: Current   Smokeless tobacco: Never  Vaping Use   Vaping status: Never Used  Substance and Sexual Activity   Alcohol use: Not Currently   Drug use: Not Currently    Types: Marijuana   Sexual activity: Not Currently    Birth control/protection: Condom, Pill  Other Topics Concern   Not on file  Social History Narrative   Raised by grandmother   Lives with grandmother and son   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   Social Drivers of Health   Financial Resource Strain: Low Risk  (05/14/2022)   Overall Financial Resource Strain (CARDIA)    Difficulty of Paying Living Expenses: Not hard at all  Food Insecurity: Food Insecurity Present (05/14/2022)   Hunger Vital Sign    Worried About Running Out of Food in the Last Year: Sometimes true    Ran Out of Food in the Last Year: Sometimes true  Transportation Needs: No  Transportation Needs (05/14/2022)   PRAPARE - Administrator, Civil Service (Medical): No    Lack of Transportation (Non-Medical): No  Physical Activity: Patient Declined (05/14/2022)   Exercise Vital Sign    Days of Exercise per Week: Patient declined    Minutes of Exercise per Session: Patient declined  Stress: Stress Concern Present (05/14/2022)   [employer] of Occupational Health - Occupational Stress Questionnaire    Feeling of Stress : To some extent  Social Connections: Unknown (05/14/2022)   Social Connection and Isolation Panel    Frequency of Communication with Friends and Family: Three times a week    Frequency of Social Gatherings with Friends and Family: Three times a week    Attends Religious Services: 1 to 4 times per year    Active Member of Clubs or Organizations: No    Attends Banker Meetings: Never    Marital Status: Patient declined    Review of Systems Per HPI  Objective:  BP (!) 140/82   Ht 4' 9 (1.448 m)   Wt 157 lb (71.2 kg)   BMI 33.97 kg/m      08/23/2024    4:16 PM 08/23/2024    3:45 PM 08/09/2024    3:05 PM  BP/Weight  Systolic BP 140 140 150  Diastolic BP 82 90 91  Wt. (Lbs)  157  BMI  33.97 kg/m2     Physical Exam Vitals and nursing note reviewed.  Constitutional:      General: She is not in acute distress.    Appearance: Normal appearance.  HENT:     Head: Normocephalic and atraumatic.  Cardiovascular:     Rate and Rhythm: Normal rate and regular rhythm.  Pulmonary:     Effort: Pulmonary effort is normal.     Breath sounds: Normal breath sounds.  Neurological:     Mental Status: She is alert.  Psychiatric:     Comments: Anxious.     Lab Results  Component Value Date   WBC 9.2 08/23/2024   HGB 11.9 08/23/2024   HCT 38.5 08/23/2024   PLT 379 08/23/2024   GLUCOSE 171 (H) 08/23/2024   CHOL 120 08/23/2024   TRIG 96 08/23/2024   HDL 36 (L) 08/23/2024   LDLCALC 66 08/23/2024   ALT 13 08/23/2024    AST 10 08/23/2024   NA 137 08/23/2024   K 4.8 08/23/2024   CL 104 08/23/2024   CREATININE 0.75 08/23/2024   BUN 9 08/23/2024   CO2 21 08/23/2024   TSH 1.46 05/08/2021   HGBA1C 6.5 (H) 08/23/2024   MICROALBUR 0.7 06/24/2020     Assessment & Plan:  Hypertension, unspecified type Assessment & Plan: Improved. Continue current dosing of Lisinopril . Labs have been ordered. Advised to complete labs today.     Follow-up:  1 month  Keishla Oyer DO [facility]

## 2024-08-30 NOTE — Patient Instructions (Signed)
 Debra Tanner  08/30/2024     @PREFPERIOPPHARMACY @   Your procedure is scheduled on  09/06/2024.   Report to [facility] at  0600  A.M.    Call this number if you have problems the morning of surgery:  [phone]  If you experience any cold or flu symptoms such as cough, fever, chills, shortness of breath, etc. between now and your scheduled surgery, please notify us  at the above number.   Remember:         Take 1/2 of your usual night time insulin  the night before your procedure.        DO NOT take any medications for diabetes the morning of your procedure.   Do not eat after midnight.   You may drink clear liquids until 0330 am on 09/06/2024.      Clear liquids allowed are:                    Water, Juice (No red color; non-citric and without pulp; diabetics please choose diet or no sugar options), Carbonated beverages (diabetics please choose diet or no sugar options), Clear Tea (No creamer, milk, or cream, including half & half and powdered creamer), Black Coffee Only (No creamer, milk or cream, including half & half and powdered creamer), and Clear Sports drink (No red color; diabetics please choose diet or no sugar options)           At 0330 am on 09/06/2024 drink your carb drink. You can have nothing else after this.    Take these medicines the morning of surgery with A SIP OF WATER                                    ketoralac (if needed).        Do not wear jewelry, make-up or nail polish, including gel polish,  artificial nails, or any other type of covering on natural nails (fingers and  toes).  Do not wear lotions, powders, or perfumes, or deodorant.  Do not shave 48 hours prior to surgery.  Men may shave face and neck.  Do not bring valuables to the hospital.  [facility] is not responsible for any belongings or valuables.  Contacts, dentures or bridgework may not be worn into surgery.  Leave your suitcase in the car.  After surgery it may be  brought to your room.  For patients admitted to the hospital, discharge time will be determined by your treatment team.  Patients discharged the day of surgery will not be allowed to drive home and must have someone with them for 24 hours.Debra Tanner    Special instructions:   DO NOT smoke tobacco or vape for 24 hours before your procedure.  Please read over the following fact sheets that you were given. Pain Booklet, Blood Transfusion Information, Surgical Site Infection Prevention, Anesthesia Post-op Instructions, and Care and Recovery After Surgery      Laparoscopically Assisted Vaginal Hysterectomy, Care After After a LAVH, it's common to have soreness and numbness in your incision areas. You'll have pain in your belly. You may also have: Bleeding and discharge from the vagina. Tiredness. Sadness and other emotions. If your ovaries were taken out, you may also have symptoms of menopause, such as hot flashes, night sweats, and lack of sleep. Follow these instructions at home: Medicines Take over-the-counter and prescription medicines only  as told by your health care provider. If you were given antibiotics, take them as told by your provider. Do not stop taking the antibiotic even if you start to feel better. Ask your provider if the medicine prescribed to you: Requires you to avoid driving or using machinery. Can cause constipation. You may need to take these actions to prevent or treat constipation: Drink enough fluid to keep your pee (urine) pale yellow. Take over-the-counter or prescription medicines. Eat foods that are high in fiber, such as beans, whole grains, and fresh fruits and vegetables. Limit foods that are high in fat and processed sugars, such as fried or sweet foods. Incision care  Follow instructions from your provider about how to take care of your incisions. Make sure you: Wash your hands with soap and water for at least 20 seconds before and after you change your  bandage. If soap and water aren't available, use hand sanitizer. Change your dressing as told by your provider. Leave stitches, skin glue, or tape strips in place. These skin closures may need to stay in place for 2 weeks or longer. If tape strip edges start to loosen and curl up, you may trim the loose edges. Do not remove tape strips completely unless your provider tells you to do that. Check your incision areas every day for signs of infection. Check for: More redness, swelling, or pain. More fluid or blood. Warmth. Pus or a bad smell. Activity  Rest as told by your provider. Do not sit for a long time without moving. Get up to take short walks every 1-2 hours. This will improve blood flow and breathing. Ask for help if you feel weak or unsteady. You may have to avoid lifting. Ask your provider how much you can safely lift. Return to your normal activities as told by your provider. Ask your provider what activities are safe for you. Lifestyle Do not use any products that contain nicotine or tobacco. These products include cigarettes, chewing tobacco, and vaping devices, such as e-cigarettes. These can delay healing after surgery. If you need help quitting, ask your provider. Do not drink alcohol until your provider approves. General instructions Do not douche, use tampons, have sex, or put anything in the vagina for at least 6 weeks. If you struggle with physical or emotional changes after your procedure, speak with your provider or a therapist. Do not take baths, swim, or use a hot tub until your provider approves. Ask your provider if you may take showers. You may only be allowed to take sponge baths. Try to have someone at home with you for the first 1-2 weeks to help with your daily chores. Wear compression stockings as told by your provider. These stockings help to prevent blood clots and reduce swelling in your legs. Your provider may give you more instructions. Make sure you know  what you can and can't do. Contact a health care provider if: You have any signs of infection. You have pain and your pain medicine doesn't help. You feel dizzy or light-headed. You have trouble peeing. You vomit or feel like you may vomit, and the symptoms do not go away. You have pus or discharge from your vagina that smells bad. Get help right away if: You have a fever and your symptoms suddenly get worse. You have very bad pain in the abdomen. You have chest pain or shortness of breath. You faint. You have pain, swelling, or redness in your leg. You have heavy bleeding in your  vagina that soaks through a pad in less than 1 hour. You see blood clots in your bleeding. These symptoms may be an emergency. Get help right away. Call 911. Do not wait to see if the symptoms will go away. Do not drive yourself to the hospital. This information is not intended to replace advice given to you by your health care provider. Make sure you discuss any questions you have with your health care provider. Document Revised: 02/19/2023 Document Reviewed: 02/19/2023 Elsevier Patient Education  2024 [employer]   General Anesthesia, Adult, Care After The following information offers guidance on how to care for yourself after your procedure. Your health care provider may also give you more specific instructions. If you have problems or questions, contact your health care provider. What can I expect after the procedure? After the procedure, it is common for people to: Have pain or discomfort at the IV site. Have nausea or vomiting. Have a sore throat or hoarseness. Have trouble concentrating. Feel cold or chills. Feel weak, sleepy, or tired (fatigue). Have soreness and body aches. These can affect parts of the body that were not involved in surgery. Follow these instructions at home: For the time period you were told by your health care provider:  Rest. Do not participate in activities where you  could fall or become injured. Do not drive or use machinery. Do not drink alcohol. Do not take sleeping pills or medicines that cause drowsiness. Do not make important decisions or sign legal documents. Do not take care of children on your own. General instructions Drink enough fluid to keep your urine pale yellow. If you have sleep apnea, surgery and certain medicines can increase your risk for breathing problems. Follow instructions from your health care provider about wearing your sleep device: Anytime you are sleeping, including during daytime naps. While taking prescription pain medicines, sleeping medicines, or medicines that make you drowsy. Return to your normal activities as told by your health care provider. Ask your health care provider what activities are safe for you. Take over-the-counter and prescription medicines only as told by your health care provider. Do not use any products that contain nicotine or tobacco. These products include cigarettes, chewing tobacco, and vaping devices, such as e-cigarettes. These can delay incision healing after surgery. If you need help quitting, ask your health care provider. Contact a health care provider if: You have nausea or vomiting that does not get better with medicine. You vomit every time you eat or drink. You have pain that does not get better with medicine. You cannot urinate or have bloody urine. You develop a skin rash. You have a fever. Get help right away if: You have trouble breathing. You have chest pain. You vomit blood. These symptoms may be an emergency. Get help right away. Call 911. Do not wait to see if the symptoms will go away. Do not drive yourself to the hospital. Summary After the procedure, it is common to have a sore throat, hoarseness, nausea, vomiting, or to feel weak, sleepy, or fatigue. For the time period you were told by your health care provider, do not drive or use machinery. Get help right away if  you have difficulty breathing, have chest pain, or vomit blood. These symptoms may be an emergency. This information is not intended to replace advice given to you by your health care provider. Make sure you discuss any questions you have with your health care provider. Document Revised: 02/06/2022 Document Reviewed: 02/06/2022 Elsevier Patient  Education  2024 [employer]How to Use Chlorhexidine at Home in the Shower Chlorhexidine gluconate (CHG) is a germ-killing (antiseptic) wash that's used to clean the skin. It can get rid of the germs that normally live on the skin and can keep them away for about 24 hours. If you're having surgery, you may be told to shower with CHG at home the night before surgery. This can help lower your risk for infection. To use CHG wash in the shower, follow the steps below. Supplies needed: CHG body wash. Clean washcloth. Clean towel. How to use CHG in the shower Follow these steps unless you're told to use CHG in a different way: Start the shower. Use your normal soap and shampoo to wash your face and hair. Turn off the shower or move out of the shower stream. Pour CHG onto a clean washcloth. Do not use any type of brush or rough sponge. Start at your neck, washing your body down to your toes. Make sure you: Wash the part of your body where the surgery will be done for at least 1 minute. Do not scrub. Do not use CHG on your head or face unless your health care provider tells you to. If it gets into your ears or eyes, rinse them well with water. Do not wash your genitals with CHG. Wash your back and under your arms. Make sure to wash skin folds. Let the CHG sit on your skin for 1-2 minutes or as long as told. Rinse your entire body in the shower, including all body creases and folds. Turn off the shower. Dry off with a clean towel. Do not put anything on your skin afterward, such as powder, lotion, or perfume. Put on clean clothes or pajamas. If it's the  night before surgery, sleep in clean sheets. General tips Use CHG only as told, and follow the instructions on the label. Use the full amount of CHG as told. This is often one bottle. Do not smoke and stay away from flames after using CHG. Your skin may feel sticky after using CHG. This is normal. The sticky feeling will go away as the CHG dries. Do not use CHG: If you have a chlorhexidine allergy or have reacted to chlorhexidine in the past. On open wounds or areas of skin that have broken skin, cuts, or scrapes. On babies younger than 23 months of age. Contact a health care provider if: You have questions about using CHG. Your skin gets irritated or itchy. You have a rash after using CHG. You swallow any CHG. Call your local poison control center [phone] in the [place] Your eyes itch badly, or they become very red or swollen. Your hearing changes. You have trouble seeing. If you can't reach your provider, go to an urgent care or emergency room. Do not drive yourself. Get help right away if: You have swelling or tingling in your mouth or throat. You make high-pitched whistling sounds when you breathe, most often when you breathe out (wheeze). You have trouble breathing. These symptoms may be an emergency. Call 911 right away. Do not wait to see if the symptoms will go away. Do not drive yourself to the hospital. This information is not intended to replace advice given to you by your health care provider. Make sure you discuss any questions you have with your health care provider. Document Revised: 05/25/2023 Document Reviewed: 05/21/2022 Elsevier Patient Education  2024 [employer].

## 2024-08-31 NOTE — Pre-Procedure Instructions (Signed)
 Dr Jayne notified of recent CBC and CMET and is okay with these results for her upcoming surgery. We do not have to repeat these.

## 2024-09-04 ENCOUNTER — Encounter (HOSPITAL_COMMUNITY): Payer: Self-pay

## 2024-09-04 ENCOUNTER — Other Ambulatory Visit (HOSPITAL_COMMUNITY): Payer: Self-pay | Admitting: Obstetrics & Gynecology

## 2024-09-04 ENCOUNTER — Encounter (HOSPITAL_COMMUNITY)
Admission: RE | Admit: 2024-09-04 | Discharge: 2024-09-04 | Disposition: A | Discharge status: Home / Self Care | Source: Ambulatory Visit | Attending: Obstetrics & Gynecology | Admitting: Obstetrics & Gynecology

## 2024-09-04 VITALS — BP 138/92 | HR 77 | Resp 18 | Ht <= 58 in | Wt 157.0 lb

## 2024-09-04 DIAGNOSIS — Z01818 Encounter for other preprocedural examination: Secondary | ICD-10-CM | POA: Diagnosis not present

## 2024-09-04 DIAGNOSIS — E669 Obesity, unspecified: Secondary | ICD-10-CM | POA: Diagnosis not present

## 2024-09-04 DIAGNOSIS — I10 Essential (primary) hypertension: Secondary | ICD-10-CM | POA: Insufficient documentation

## 2024-09-04 DIAGNOSIS — E119 Type 2 diabetes mellitus without complications: Secondary | ICD-10-CM | POA: Insufficient documentation

## 2024-09-04 DIAGNOSIS — E0800 Diabetes mellitus due to underlying condition with hyperosmolarity without nonketotic hyperglycemic-hyperosmolar coma (NKHHC): Secondary | ICD-10-CM

## 2024-09-04 HISTORY — DX: Gastro-esophageal reflux disease without esophagitis: K21.9

## 2024-09-04 HISTORY — DX: Other specified postprocedural states: Z98.890

## 2024-09-04 LAB — URINALYSIS, ROUTINE W REFLEX MICROSCOPIC
Bacteria, UA: NONE SEEN
Bilirubin Urine: NEGATIVE
Glucose, UA: 50 mg/dL — AB
Ketones, ur: NEGATIVE mg/dL
Leukocytes,Ua: NEGATIVE
Nitrite: NEGATIVE
Protein, ur: NEGATIVE mg/dL
Specific Gravity, Urine: 1.023 (ref 1.005–1.030)
pH: 5 (ref 5.0–8.0)

## 2024-09-04 LAB — CBC
HCT: 33.7 % — ABNORMAL LOW (ref 36.0–46.0)
Hemoglobin: 10.7 g/dL — ABNORMAL LOW (ref 12.0–15.0)
MCH: 29.1 pg (ref 26.0–34.0)
MCHC: 31.8 g/dL (ref 30.0–36.0)
MCV: 91.6 fL (ref 80.0–100.0)
Platelets: 352 K/uL (ref 150–400)
RBC: 3.68 MIL/uL — ABNORMAL LOW (ref 3.87–5.11)
RDW: 13.2 % (ref 11.5–15.5)
WBC: 7.3 K/uL (ref 4.0–10.5)
nRBC: 0 % (ref 0.0–0.2)

## 2024-09-04 LAB — RAPID HIV SCREEN (HIV 1/2 AB+AG)
HIV 1/2 Antibodies: NONREACTIVE
HIV-1 P24 Antigen - HIV24: NONREACTIVE

## 2024-09-04 LAB — COMPREHENSIVE METABOLIC PANEL WITH GFR
ALT: 13 U/L (ref 0–44)
AST: 13 U/L — ABNORMAL LOW (ref 15–41)
Albumin: 3.8 g/dL (ref 3.5–5.0)
Alkaline Phosphatase: 70 U/L (ref 38–126)
Anion gap: 12 (ref 5–15)
BUN: 9 mg/dL (ref 6–20)
CO2: 21 mmol/L — ABNORMAL LOW (ref 22–32)
Calcium: 9.1 mg/dL (ref 8.9–10.3)
Chloride: 104 mmol/L (ref 98–111)
Creatinine, Ser: 0.59 mg/dL (ref 0.44–1.00)
GFR, Estimated: >60 mL/min (ref >60)
Glucose, Bld: 166 mg/dL — ABNORMAL HIGH (ref 70–99)
Potassium: 4 mmol/L (ref 3.5–5.1)
Sodium: 138 mmol/L (ref 135–145)
Total Bilirubin: 0.3 mg/dL (ref 0.0–1.2)
Total Protein: 6.6 g/dL (ref 6.5–8.1)

## 2024-09-04 LAB — TYPE AND SCREEN
ABO/RH(D): A POS
Antibody Screen: NEGATIVE

## 2024-09-04 LAB — PREGNANCY, URINE: Preg Test, Ur: NEGATIVE

## 2024-09-06 ENCOUNTER — Ambulatory Visit (HOSPITAL_COMMUNITY): Admitting: Anesthesiology

## 2024-09-06 ENCOUNTER — Encounter (HOSPITAL_COMMUNITY)
Admission: RE | Disposition: A | Discharge status: Home / Self Care | Payer: Self-pay | Source: Home / Self Care | Attending: Obstetrics & Gynecology

## 2024-09-06 ENCOUNTER — Other Ambulatory Visit: Payer: Self-pay

## 2024-09-06 ENCOUNTER — Ambulatory Visit (HOSPITAL_COMMUNITY)
Admission: RE | Admit: 2024-09-06 | Discharge: 2024-09-06 | Disposition: A | Discharge status: Home / Self Care | Attending: Obstetrics & Gynecology | Admitting: Obstetrics & Gynecology

## 2024-09-06 ENCOUNTER — Encounter (HOSPITAL_COMMUNITY): Payer: Self-pay | Admitting: Obstetrics & Gynecology

## 2024-09-06 DIAGNOSIS — D509 Iron deficiency anemia, unspecified: Secondary | ICD-10-CM | POA: Insufficient documentation

## 2024-09-06 DIAGNOSIS — G4730 Sleep apnea, unspecified: Secondary | ICD-10-CM | POA: Insufficient documentation

## 2024-09-06 DIAGNOSIS — N946 Dysmenorrhea, unspecified: Secondary | ICD-10-CM | POA: Diagnosis not present

## 2024-09-06 DIAGNOSIS — E119 Type 2 diabetes mellitus without complications: Secondary | ICD-10-CM | POA: Diagnosis not present

## 2024-09-06 DIAGNOSIS — N921 Excessive and frequent menstruation with irregular cycle: Secondary | ICD-10-CM

## 2024-09-06 DIAGNOSIS — F411 Generalized anxiety disorder: Secondary | ICD-10-CM | POA: Insufficient documentation

## 2024-09-06 DIAGNOSIS — D250 Submucous leiomyoma of uterus: Secondary | ICD-10-CM | POA: Diagnosis not present

## 2024-09-06 DIAGNOSIS — F32A Depression, unspecified: Secondary | ICD-10-CM | POA: Insufficient documentation

## 2024-09-06 DIAGNOSIS — N8003 Adenomyosis of the uterus: Secondary | ICD-10-CM

## 2024-09-06 DIAGNOSIS — K219 Gastro-esophageal reflux disease without esophagitis: Secondary | ICD-10-CM | POA: Diagnosis not present

## 2024-09-06 DIAGNOSIS — I10 Essential (primary) hypertension: Secondary | ICD-10-CM | POA: Diagnosis not present

## 2024-09-06 DIAGNOSIS — D252 Subserosal leiomyoma of uterus: Secondary | ICD-10-CM | POA: Diagnosis not present

## 2024-09-06 DIAGNOSIS — D219 Benign neoplasm of connective and other soft tissue, unspecified: Secondary | ICD-10-CM

## 2024-09-06 HISTORY — PX: HYSTERECTOMY,TOTAL,BILAT SALPINGO-OOPHORECTOMY, ROBOT, LAP: SHX7359

## 2024-09-06 LAB — GLUCOSE, CAPILLARY
Glucose-Capillary: 153 mg/dL — ABNORMAL HIGH (ref 70–99)
Glucose-Capillary: 168 mg/dL — ABNORMAL HIGH (ref 70–99)

## 2024-09-06 SURGERY — HYSTERECTOMY, TOTAL, BILATERAL SAPLINGO-OOPHORECTOMY, ROBOT ASSISTED, LAPAROSCOPIC, D5
Anesthesia: General | Site: Abdomen | Laterality: Bilateral

## 2024-09-06 MED ORDER — CHLORHEXIDINE GLUCONATE 0.12 % MT SOLN: 15.0000 mL | Freq: Once | OROMUCOSAL | Status: DC

## 2024-09-06 MED ORDER — LACTATED RINGERS IV SOLN: INTRAVENOUS | Status: DC

## 2024-09-06 MED ORDER — DEXMEDETOMIDINE HCL IN NACL 80 MCG/20ML IV SOLN
INTRAVENOUS | Status: DC | PRN
  Administered 2024-09-06: 12 ug via INTRAVENOUS
  Administered 2024-09-06: 20 ug via INTRAVENOUS

## 2024-09-06 MED ORDER — SUGAMMADEX SODIUM 200 MG/2ML IV SOLN
INTRAVENOUS | Status: DC | PRN
  Administered 2024-09-06: 200 mg via INTRAVENOUS

## 2024-09-06 MED ORDER — HYDROMORPHONE HCL 1 MG/ML IJ SOLN
INTRAMUSCULAR | Status: AC
  Filled 2024-09-06: qty 0.5

## 2024-09-06 MED ORDER — KETOROLAC TROMETHAMINE 10 MG PO TABS: 10.0000 mg | ORAL_TABLET | Freq: Three times a day (TID) | ORAL | 0 refills | Status: DC | PRN

## 2024-09-06 MED ORDER — LIDOCAINE 2% (20 MG/ML) 5 ML SYRINGE
INTRAMUSCULAR | Status: AC
  Filled 2024-09-06: qty 5

## 2024-09-06 MED ORDER — CIPROFLOXACIN HCL 500 MG PO TABS: 500.0000 mg | ORAL_TABLET | Freq: Two times a day (BID) | ORAL | 0 refills | Status: DC

## 2024-09-06 MED ORDER — OXYCODONE HCL 5 MG PO TABS: 5.0000 mg | ORAL_TABLET | Freq: Once | ORAL | Status: DC | PRN

## 2024-09-06 MED ORDER — OXYCODONE-ACETAMINOPHEN 7.5-325 MG PO TABS: 1.0000 | ORAL_TABLET | Freq: Four times a day (QID) | ORAL | 0 refills | Status: DC | PRN

## 2024-09-06 MED ORDER — FENTANYL CITRATE (PF) 250 MCG/5ML IJ SOLN
INTRAMUSCULAR | Status: DC | PRN
  Administered 2024-09-06: 50 ug via INTRAVENOUS
  Administered 2024-09-06 (×3): 100 ug via INTRAVENOUS

## 2024-09-06 MED ORDER — MIDAZOLAM HCL 2 MG/2ML IJ SOLN
INTRAMUSCULAR | Status: AC
  Filled 2024-09-06: qty 2

## 2024-09-06 MED ORDER — POVIDONE-IODINE 10 % EX SWAB: 2.0000 | Freq: Once | CUTANEOUS | Status: DC

## 2024-09-06 MED ORDER — BUPIVACAINE HCL 0.25 % IJ SOLN
INTRAMUSCULAR | Status: DC | PRN
  Administered 2024-09-06: 40 mL

## 2024-09-06 MED ORDER — FENTANYL CITRATE (PF) 250 MCG/5ML IJ SOLN
INTRAMUSCULAR | Status: AC
  Filled 2024-09-06: qty 5

## 2024-09-06 MED ORDER — ONDANSETRON HCL 4 MG/2ML IJ SOLN
4.0000 mg | Freq: Once | INTRAMUSCULAR | Status: AC | PRN
  Administered 2024-09-06: 4 mg via INTRAVENOUS

## 2024-09-06 MED ORDER — ROCURONIUM BROMIDE 10 MG/ML (PF) SYRINGE
PREFILLED_SYRINGE | INTRAVENOUS | Status: DC | PRN
Start: 2024-09-06 — End: 2024-09-06
  Administered 2024-09-06: 20 mg via INTRAVENOUS
  Administered 2024-09-06: 10 mg via INTRAVENOUS
  Administered 2024-09-06: 70 mg via INTRAVENOUS

## 2024-09-06 MED ORDER — MIDAZOLAM HCL 2 MG/2ML IJ SOLN
INTRAMUSCULAR | Status: DC | PRN
Start: 2024-09-06 — End: 2024-09-06
  Administered 2024-09-06: 2 mg via INTRAVENOUS

## 2024-09-06 MED ORDER — HYDROMORPHONE HCL 1 MG/ML IJ SOLN
INTRAMUSCULAR | Status: DC | PRN
  Administered 2024-09-06: .5 mg via INTRAVENOUS

## 2024-09-06 MED ORDER — KETOROLAC TROMETHAMINE 30 MG/ML IJ SOLN
30.0000 mg | INTRAMUSCULAR | Status: AC
  Administered 2024-09-06: 30 mg via INTRAVENOUS
  Filled 2024-09-06: qty 1

## 2024-09-06 MED ORDER — ORAL CARE MOUTH RINSE: 15.0000 mL | Freq: Once | OROMUCOSAL | Status: DC

## 2024-09-06 MED ORDER — FENTANYL CITRATE (PF) 50 MCG/ML IJ SOSY
25.0000 ug | PREFILLED_SYRINGE | INTRAMUSCULAR | Status: DC | PRN
  Administered 2024-09-06: 50 ug via INTRAVENOUS
  Filled 2024-09-06: qty 1

## 2024-09-06 MED ORDER — PROPOFOL 10 MG/ML IV BOLUS
INTRAVENOUS | Status: DC | PRN
  Administered 2024-09-06: 150 mg via INTRAVENOUS

## 2024-09-06 MED ORDER — CEFAZOLIN SODIUM-DEXTROSE 2-4 GM/100ML-% IV SOLN
INTRAVENOUS | Status: AC
  Filled 2024-09-06: qty 100

## 2024-09-06 MED ORDER — DEXMEDETOMIDINE HCL IN NACL 80 MCG/20ML IV SOLN
INTRAVENOUS | Status: AC
  Filled 2024-09-06: qty 20

## 2024-09-06 MED ORDER — STERILE WATER FOR IRRIGATION IR SOLN
Status: DC | PRN
  Administered 2024-09-06: 500 mL

## 2024-09-06 MED ORDER — CEFAZOLIN SODIUM-DEXTROSE 2-3 GM-%(50ML) IV SOLR
INTRAVENOUS | Status: DC | PRN
  Administered 2024-09-06: 2 g via INTRAVENOUS

## 2024-09-06 MED ORDER — ONDANSETRON 8 MG PO TBDP
8.0000 mg | ORAL_TABLET | Freq: Three times a day (TID) | ORAL | 0 refills | Status: DC | PRN
Start: 2024-09-06 — End: 2024-09-25

## 2024-09-06 MED ORDER — BUPIVACAINE HCL (PF) 0.25 % IJ SOLN
INTRAMUSCULAR | Status: AC
  Filled 2024-09-06: qty 60

## 2024-09-06 MED ORDER — ONDANSETRON HCL 4 MG/2ML IJ SOLN
INTRAMUSCULAR | Status: AC
  Filled 2024-09-06: qty 2

## 2024-09-06 MED ORDER — SODIUM CHLORIDE 0.9 % IR SOLN
Status: DC | PRN
  Administered 2024-09-06: 3000 mL

## 2024-09-06 MED ORDER — PROPOFOL 10 MG/ML IV BOLUS
INTRAVENOUS | Status: AC
  Filled 2024-09-06: qty 20

## 2024-09-06 MED ORDER — OXYCODONE HCL 5 MG/5ML PO SOLN: 5.0000 mg | Freq: Once | ORAL | Status: DC | PRN

## 2024-09-06 MED ORDER — LIDOCAINE 2% (20 MG/ML) 5 ML SYRINGE
INTRAMUSCULAR | Status: DC | PRN
  Administered 2024-09-06: 80 mg via INTRAVENOUS

## 2024-09-06 MED ORDER — ROCURONIUM BROMIDE 10 MG/ML (PF) SYRINGE
PREFILLED_SYRINGE | INTRAVENOUS | Status: AC
  Filled 2024-09-06: qty 10

## 2024-09-06 MED ORDER — FENTANYL CITRATE (PF) 100 MCG/2ML IJ SOLN
INTRAMUSCULAR | Status: AC
  Filled 2024-09-06: qty 2

## 2024-09-06 MED ORDER — CEFAZOLIN SODIUM-DEXTROSE 2-4 GM/100ML-% IV SOLN
2.0000 g | Freq: Three times a day (TID) | INTRAVENOUS | Status: DC
  Filled 2024-09-06: qty 100

## 2024-09-06 MED ORDER — CHLORHEXIDINE GLUCONATE 0.12 % MT SOLN
15.0000 mL | Freq: Once | OROMUCOSAL | Status: AC
  Administered 2024-09-06: 15 mL via OROMUCOSAL
  Filled 2024-09-06: qty 15

## 2024-09-06 SURGICAL SUPPLY — 49 items
BLADE SURG SZ11 CARB STEEL (BLADE) ×1 IMPLANT
CAUTERY HOOK MNPLR 1.6 DVNC XI (INSTRUMENTS) ×1 IMPLANT
COVER LIGHT HANDLE STERIS (MISCELLANEOUS) ×2 IMPLANT
COVER MAYO STAND XLG (MISCELLANEOUS) ×1 IMPLANT
DERMABOND ADVANCED .7 DNX12 (GAUZE/BANDAGES/DRESSINGS) ×1 IMPLANT
DRAPE ARM DVNC X/XI (DISPOSABLE) ×4 IMPLANT
DRAPE COLUMN DVNC XI (DISPOSABLE) ×1 IMPLANT
DRIVER NDL MEGA SUTCUT DVNCXI (INSTRUMENTS) ×1 IMPLANT
DRIVER NDLE MEGA SUTCUT DVNCXI (INSTRUMENTS) ×1 IMPLANT
ELECTRODE REM PT RTRN 9FT ADLT (ELECTROSURGICAL) ×1 IMPLANT
FORCEPS PROGRASP DVNC XI (FORCEP) ×1 IMPLANT
GAUZE 4X4 16PLY ~~LOC~~+RFID DBL (SPONGE) ×2 IMPLANT
GLOVE BIOGEL PI IND STRL 7.0 (GLOVE) ×4 IMPLANT
GLOVE BIOGEL PI IND STRL 8 (GLOVE) ×2 IMPLANT
GLOVE ECLIPSE 8.0 STRL XLNG CF (GLOVE) ×3 IMPLANT
GOWN STRL REUS W/TWL LRG LVL3 (GOWN DISPOSABLE) ×2 IMPLANT
GOWN STRL REUS W/TWL XL LVL3 (GOWN DISPOSABLE) ×2 IMPLANT
KIT PINK PAD W/HEAD ARM REST (MISCELLANEOUS) ×1 IMPLANT
KIT TURNOVER CYSTO (KITS) ×1 IMPLANT
MANIFOLD NEPTUNE II (INSTRUMENTS) ×1 IMPLANT
NDL HYPO 21X1.5 SAFETY (NEEDLE) ×1 IMPLANT
NDL INSUFFLATION 14GA 120MM (NEEDLE) ×1 IMPLANT
NEEDLE HYPO 21X1.5 SAFETY (NEEDLE) ×1 IMPLANT
NEEDLE INSUFFLATION 14GA 120MM (NEEDLE) ×1 IMPLANT
NS IRRIG 500ML POUR BTL (IV SOLUTION) ×1 IMPLANT
OBTURATOR OPTICALSTD 8 DVNC (TROCAR) ×1 IMPLANT
PACK PERI GYN (CUSTOM PROCEDURE TRAY) ×1 IMPLANT
RUMI II GYRUS 4.0CM BLUE (DISPOSABLE) IMPLANT
SEAL UNIV 5-12 XI (MISCELLANEOUS) ×3 IMPLANT
SEALER VESSEL EXT DVNC XI (MISCELLANEOUS) ×1 IMPLANT
SET BASIN LINEN APH (SET/KITS/TRAYS/PACK) ×1 IMPLANT
SET IRRIG TUBING LAPAROSCOPIC (IRRIGATION / IRRIGATOR) ×1 IMPLANT
SET TRI-LUMEN FLTR TB AIRSEAL (TUBING) ×1 IMPLANT
SET TUBE DA VINCI INSUFFLATOR (TUBING) IMPLANT
SET TUBE IRRIG SUCTION NO TIP (IRRIGATION / IRRIGATOR) ×1 IMPLANT
SOLUTION ANTFG W/FOAM PAD STRL (MISCELLANEOUS) ×1 IMPLANT
SPONGE T-LAP 18X18 ~~LOC~~+RFID (SPONGE) ×1 IMPLANT
SUT VICRYL 0 UR6 27IN ABS (SUTURE) ×1 IMPLANT
SUT VICRYL AB 3-0 FS1 BRD 27IN (SUTURE) ×1 IMPLANT
SUTURE STRATFX 0 PDS+ CT-2 23 (SUTURE) ×1 IMPLANT
SYR 10ML LL (SYRINGE) ×2 IMPLANT
SYR 50ML LL SCALE MARK (SYRINGE) ×2 IMPLANT
SYR CONTROL 10ML LL (SYRINGE) ×2 IMPLANT
TIP RUMI ORANGE 6.7MMX12CM (TIP) ×1 IMPLANT
TIP UTERINE 6.7X10CM GRN DISP (MISCELLANEOUS) IMPLANT
TRAY FOLEY SLVR 16FR LF STAT (SET/KITS/TRAYS/PACK) ×1 IMPLANT
TROCAR KII 8X100ML NONTHREADED (TROCAR) ×1 IMPLANT
TROCAR PORT AIRSEAL 8X120 (TROCAR) ×1 IMPLANT
WATER STERILE IRR 500ML POUR (IV SOLUTION) ×1 IMPLANT

## 2024-09-06 NOTE — Transfer of Care (Signed)
 Immediate Anesthesia Transfer of Care Note  Patient: Debra Tanner  Procedure(s) Performed: HYSTERECTOMY, TOTAL, BILATERAL SALPINGECTOMY, ROBOT ASSISTED, LAPAROSCOPIC, D5 (Bilateral: Abdomen)  Patient Location: PACU  Anesthesia Type:General  Level of Consciousness: awake, drowsy, and patient cooperative  Airway & Oxygen Therapy: Patient Spontanous Breathing and Patient connected to face mask oxygen  Post-op Assessment: Report given to RN, Post -op Vital signs reviewed and stable, and Patient moving all extremities X 4  Post vital signs: Reviewed and stable  Last Vitals:  Vitals Value Taken Time  BP 145/81 09/06/24 11:22  Temp    Pulse 85 09/06/24 11:25  Resp 22 09/06/24 11:25  SpO2 96 % 09/06/24 11:25  Vitals shown include unfiled device data.  Last Pain:  Vitals:   09/06/24 0659  TempSrc:   PainSc: 0-No pain      Patients Stated Pain Goal: 6 (09/06/24 9356)  Complications: No notable events documented.

## 2024-09-06 NOTE — Discharge Instructions (Signed)
  Post Anesthesia Home Care Instructions  Activity: Get plenty of rest for the remainder of the day. A responsible individual must stay with you for 24 hours following the procedure.  For the next 24 hours, DO NOT: -Drive a car -Paediatric nurse -Drink alcoholic beverages -Take any medication unless instructed by your physician -Make any legal decisions or sign important papers.  Meals: Start with liquid foods such as gelatin or soup. Progress to regular foods as tolerated. Avoid greasy, spicy, heavy foods. If nausea and/or vomiting occur, drink only clear liquids until the nausea and/or vomiting subsides. Call your physician if vomiting continues.  Special Instructions/Symptoms: Your throat may feel dry or sore from the anesthesia or the breathing tube placed in your throat during surgery. If this causes discomfort, gargle with warm salt water. The discomfort should disappear within 24 hours.  If you had a scopolamine patch placed behind your ear for the management of post- operative nausea and/or vomiting:  1. The medication in the patch is effective for 72 hours, after which it should be removed.  Wrap patch in a tissue and discard in the trash. Wash hands thoroughly with soap and water. 2. You may remove the patch earlier than 72 hours if you experience unpleasant side effects which may include dry mouth, dizziness or visual disturbances. 3. Avoid touching the patch. Wash your hands with soap and water after contact with the patch.    Post Anesthesia Home Care Instructions  Activity: Get plenty of rest for the remainder of the day. A responsible individual must stay with you for 24 hours following the procedure.  For the next 24 hours, DO NOT: -Drive a car -Paediatric nurse -Drink alcoholic beverages -Take any medication unless instructed by your physician -Make any legal decisions or sign important papers.  Meals: Start with liquid foods such as gelatin or soup. Progress to  regular foods as tolerated. Avoid greasy, spicy, heavy foods. If nausea and/or vomiting occur, drink only clear liquids until the nausea and/or vomiting subsides. Call your physician if vomiting continues.  Special Instructions/Symptoms: Your throat may feel dry or sore from the anesthesia or the breathing tube placed in your throat during surgery. If this causes discomfort, gargle with warm salt water. The discomfort should disappear within 24 hours.

## 2024-09-06 NOTE — H&P (Signed)
 Preoperative History and Physical  Debra Tanner is a 43 y.o. G2P1011 with No LMP recorded (lmp unknown). (Menstrual status: Irregular Periods). admitted for a RA TLH + BS for submucosal fibroid/fibroids in general with associated DUB/HMB + IDA.   07/18/24 note with me: Pt with multiple submucosal fibroids causing long term menometrorrhagia and dymenorrhea Sonogram reviewed images with patient Will try to temporize with megestrol  but recommend proceeding with RA TLH + BS  05/04/24 note Dr Ozan: After much discussion regarding continuing current medication, changed to a different type of medication or consideration for surgical management.  Patient did not seem ready yet to make any sort of change - Encourage patient to track menses to have a better objective idea as to whether or not the bleeding has significantly improved - Reviewed lab work recent hemoglobin in March 2025 still anemic Hgb 10.6 - Will plan for now to continue with current management as she desired to give it a bit more time and will follow-up in September should she note heavy menses or desire to proceed with an alternative option patient to return to clinic sooner PMH:    Past Medical History:  Diagnosis Date   Anemia    Depression    Diabetes mellitus without complication (HCC)    Encounter for gynecological examination with Papanicolaou smear of cervix 09/08/2018   Essential hypertension, benign 11/07/2019   Fibroid 03/16/2023   GAD (generalized anxiety disorder)    GERD (gastroesophageal reflux disease)    History of conization of cervix    Hypertension    Sleep apnea    Trichimoniasis     PSH:    History reviewed. No pertinent surgical history.  POb/GynH:      OB History     Gravida  2   Para  1   Term  1   Preterm      AB  1   Living  1      SAB  1   IAB      Ectopic      Multiple      Live Births              SH:   Social History   Tobacco Use   Smoking status: Never     Passive exposure: Current   Smokeless tobacco: Never  Vaping Use   Vaping status: Never Used  Substance Use Topics   Alcohol use: Not Currently   Drug use: Not Currently    Types: Marijuana    FH:    Family History  Problem Relation Age of Onset   Alcohol abuse Mother    Cirrhosis Mother    Alcohol abuse Father    Heart disease Father    Hypertension Father    Diabetes Sister    Sickle cell trait Sister    Hypertension Maternal Grandmother    Hyperlipidemia Maternal Grandmother    Cancer Maternal Grandfather        lung   Cancer Paternal Grandfather    Hypertension Paternal Grandmother      Allergies:  Allergies  Allergen Reactions   Latex Rash    Medications:       Current Facility-Administered Medications:    ceFAZolin (ANCEF) IVPB 2g/100 mL premix, 2 g, Intravenous, Q8H, Ismail Graziani H, MD   lactated ringers infusion, , Intravenous, Continuous, Kiel, Yvonna PARAS, MD, Last Rate: 40 mL/hr at 09/06/24 0659, New Bag at 09/06/24 0659   povidone-iodine 10 % swab 2 Application, 2 Application, Topical,  Once, Jayne Vonn DEL, MD  Review of Systems:   Review of Systems  Constitutional: Negative for fever, chills, weight loss, malaise/fatigue and diaphoresis.  HENT: Negative for hearing loss, ear pain, nosebleeds, congestion, sore throat, neck pain, tinnitus and ear discharge.   Eyes: Negative for blurred vision, double vision, photophobia, pain, discharge and redness.  Respiratory: Negative for cough, hemoptysis, sputum production, shortness of breath, wheezing and stridor.   Cardiovascular: Negative for chest pain, palpitations, orthopnea, claudication, leg swelling and PND.  Gastrointestinal: Positive for abdominal pain. Negative for heartburn, nausea, vomiting, diarrhea, constipation, blood in stool and melena.  Genitourinary: Negative for dysuria, urgency, frequency, hematuria and flank pain.  Musculoskeletal: Negative for myalgias, back pain, joint pain and falls.   Skin: Negative for itching and rash.  Neurological: Negative for dizziness, tingling, tremors, sensory change, speech change, focal weakness, seizures, loss of consciousness, weakness and headaches.  Endo/Heme/Allergies: Negative for environmental allergies and polydipsia. Does not bruise/bleed easily.  Psychiatric/Behavioral: Negative for depression, suicidal ideas, hallucinations, memory loss and substance abuse. The patient is not nervous/anxious and does not have insomnia.      PHYSICAL EXAM:  Blood pressure (!) 148/91, pulse 84, temperature 98.5 F (36.9 C), temperature source Oral, resp. rate (!) 21, height 4' 9 (1.448 m), weight 71.2 kg, SpO2 99%.    Vitals reviewed. Constitutional: She is oriented to person, place, and time. She appears well-developed and well-nourished.  HENT:  Head: Normocephalic and atraumatic.  Right Ear: External ear normal.  Left Ear: External ear normal.  Nose: Nose normal.  Mouth/Throat: Oropharynx is clear and moist.  Eyes: Conjunctivae and EOM are normal. Pupils are equal, round, and reactive to light. Right eye exhibits no discharge. Left eye exhibits no discharge. No scleral icterus.  Neck: Normal range of motion. Neck supple. No tracheal deviation present. No thyromegaly present.  Cardiovascular: Normal rate, regular rhythm, normal heart sounds and intact distal pulses.  Exam reveals no gallop and no friction rub.   No murmur heard. Respiratory: Effort normal and breath sounds normal. No respiratory distress. She has no wheezes. She has no rales. She exhibits no tenderness.  GI: Soft. Bowel sounds are normal. She exhibits no distension and no mass. There is tenderness. There is no rebound and no guarding.  Genitourinary:       Vulva is normal without lesions Vagina is pink moist without discharge Cervix normal in appearance and pap is normal Uterus is 12 weeks size on bimanual Adnexa is negative with normal sized ovaries by sonogram   Musculoskeletal: Normal range of motion. She exhibits no edema and no tenderness.  Neurological: She is alert and oriented to person, place, and time. She has normal reflexes. She displays normal reflexes. No cranial nerve deficit. She exhibits normal muscle tone. Coordination normal.  Skin: Skin is warm and dry. No rash noted. No erythema. No pallor.  Psychiatric: She has a normal mood and affect. Her behavior is normal. Judgment and thought content normal.    Labs: Results for orders placed or performed during the hospital encounter of 09/06/24 (from the past 2 weeks)  Glucose, capillary   Collection Time: 09/06/24  7:08 AM  Result Value Ref Range   Glucose-Capillary 153 (H) 70 - 99 mg/dL  Results for orders placed or performed during the hospital encounter of 09/04/24 (from the past 2 weeks)  CBC   Collection Time: 09/04/24  9:08 AM  Result Value Ref Range   WBC 7.3 4.0 - 10.5 K/uL   RBC 3.68 (  L) 3.87 - 5.11 MIL/uL   Hemoglobin 10.7 (L) 12.0 - 15.0 g/dL   HCT 66.2 (L) 63.9 - 53.9 %   MCV 91.6 80.0 - 100.0 fL   MCH 29.1 26.0 - 34.0 pg   MCHC 31.8 30.0 - 36.0 g/dL   RDW 86.7 88.4 - 84.4 %   Platelets 352 150 - 400 K/uL   nRBC 0.0 0.0 - 0.2 %  Rapid HIV screen (HIV 1/2 Ab+Ag)   Collection Time: 09/04/24  9:08 AM  Result Value Ref Range   HIV-1 P24 Antigen - HIV24 NON REACTIVE NON REACTIVE   HIV 1/2 Antibodies NON REACTIVE NON REACTIVE   Interpretation (HIV Ag Ab)      A non reactive test result means that HIV 1 or HIV 2 antibodies and HIV 1 p24 antigen were not detected in the specimen.  Urinalysis, Routine w reflex microscopic -Urine, Clean Catch   Collection Time: 09/04/24  9:08 AM  Result Value Ref Range   Color, Urine YELLOW YELLOW   APPearance HAZY (A) CLEAR   Specific Gravity, Urine 1.023 1.005 - 1.030   pH 5.0 5.0 - 8.0   Glucose, UA 50 (A) NEGATIVE mg/dL   Hgb urine dipstick MODERATE (A) NEGATIVE   Bilirubin Urine NEGATIVE NEGATIVE   Ketones, ur NEGATIVE NEGATIVE  mg/dL   Protein, ur NEGATIVE NEGATIVE mg/dL   Nitrite NEGATIVE NEGATIVE   Leukocytes,Ua NEGATIVE NEGATIVE   RBC / HPF 0-5 0 - 5 RBC/hpf   WBC, UA 0-5 0 - 5 WBC/hpf   Bacteria, UA NONE SEEN NONE SEEN   Squamous Epithelial / HPF 0-5 0 - 5 /HPF   Mucus PRESENT    Ca Oxalate Crys, UA PRESENT   Pregnancy, urine   Collection Time: 09/04/24  9:08 AM  Result Value Ref Range   Preg Test, Ur NEGATIVE NEGATIVE  Comprehensive metabolic panel   Collection Time: 09/04/24  9:08 AM  Result Value Ref Range   Sodium 138 135 - 145 mmol/L   Potassium 4.0 3.5 - 5.1 mmol/L   Chloride 104 98 - 111 mmol/L   CO2 21 (L) 22 - 32 mmol/L   Glucose, Bld 166 (H) 70 - 99 mg/dL   BUN 9 6 - 20 mg/dL   Creatinine, Ser 9.40 0.44 - 1.00 mg/dL   Calcium  9.1 8.9 - 10.3 mg/dL   Total Protein 6.6 6.5 - 8.1 g/dL   Albumin 3.8 3.5 - 5.0 g/dL   AST 13 (L) 15 - 41 U/L   ALT 13 0 - 44 U/L   Alkaline Phosphatase 70 38 - 126 U/L   Total Bilirubin 0.3 0.0 - 1.2 mg/dL   GFR, Estimated >39 >39 mL/min   Anion gap 12 5 - 15  Type and screen   Collection Time: 09/04/24  9:08 AM  Result Value Ref Range   ABO/RH(D) A POS    Antibody Screen NEG    Sample Expiration 09/18/2024,2359    Extend sample reason      NO TRANSFUSIONS OR PREGNANCY IN THE PAST 3 MONTHS Performed at [facility], [address] [place], [place] [address]   Results for orders placed or performed in visit on 08/09/24 (from the past 2 weeks)  CBC   Collection Time: 08/23/24  4:22 PM  Result Value Ref Range   WBC 9.2 3.4 - 10.8 x10E3/uL   RBC 4.22 3.77 - 5.28 x10E6/uL   Hemoglobin 11.9 11.1 - 15.9 g/dL   Hematocrit 61.4 65.9 - 46.6 %  MCV 91 79 - 97 fL   MCH 28.2 26.6 - 33.0 pg   MCHC 30.9 (L) 31.5 - 35.7 g/dL   RDW 86.8 88.2 - 84.5 %   Platelets 379 150 - 450 x10E3/uL  CMP14+EGFR   Collection Time: 08/23/24  4:22 PM  Result Value Ref Range   Glucose 171 (H) 70 - 99 mg/dL   BUN 9 6 - 24 mg/dL   Creatinine, Ser 9.24 0.57 - 1.00 mg/dL   eGFR  897 >40 fO/fpw/8.26   BUN/Creatinine Ratio 12 9 - 23   Sodium 137 134 - 144 mmol/L   Potassium 4.8 3.5 - 5.2 mmol/L   Chloride 104 96 - 106 mmol/L   CO2 21 20 - 29 mmol/L   Calcium  9.8 8.7 - 10.2 mg/dL   Total Protein 7.0 6.0 - 8.5 g/dL   Albumin 4.1 3.9 - 4.9 g/dL   Globulin, Total 2.9 1.5 - 4.5 g/dL   Bilirubin Total 0.3 0.0 - 1.2 mg/dL   Alkaline Phosphatase 87 41 - 116 IU/L   AST 10 0 - 40 IU/L   ALT 13 0 - 32 IU/L  Hemoglobin A1c   Collection Time: 08/23/24  4:22 PM  Result Value Ref Range   Hgb A1c MFr Bld 6.5 (H) 4.8 - 5.6 %   Est. average glucose Bld gHb Est-mCnc 140 mg/dL  Lipid panel   Collection Time: 08/23/24  4:22 PM  Result Value Ref Range   Cholesterol, Total 120 100 - 199 mg/dL   Triglycerides 96 0 - 149 mg/dL   HDL 36 (L) >60 mg/dL   VLDL Cholesterol Cal 18 5 - 40 mg/dL   LDL Chol Calc (NIH) 66 0 - 99 mg/dL   Chol/HDL Ratio 3.3 0.0 - 4.4 ratio  Iron , TIBC and Ferritin Panel   Collection Time: 08/23/24  4:22 PM  Result Value Ref Range   Total Iron  Binding Capacity 354 250 - 450 ug/dL   UIBC 752 868 - 574 ug/dL   Iron  107 27 - 159 ug/dL   Iron  Saturation 30 15 - 55 %   Ferritin 18 15 - 150 ng/mL    EKG: Orders placed or performed during the hospital encounter of 09/04/24   EKG 12-LEAD   EKG 12-LEAD    Imaging Studies: No results found.    Assessment: 1. Submucous uterine fibroid  D25.0       2. Menometrorrhagia  N92.1       3. Dysmenorrhea  N94.6      Plan: RA TLH + BS  Pt understands the risks of surgery including but not limited t  excessive bleeding requiring transfusion or reoperation, post-operative infection requiring prolonged hospitalization or re-hospitalization and antibiotic therapy, and damage to other organs including bladder, bowel, ureters and major vessels.  The patient also understands the alternative treatment options which were discussed in full.  All questions were answered.  Vonn VEAR Inch 09/06/2024 7:19  AM   Vonn VEAR Inch 09/06/2024 7:15 AM

## 2024-09-06 NOTE — Anesthesia Preprocedure Evaluation (Signed)
 Anesthesia Evaluation  Patient identified by MRN, date of birth, ID band Patient awake    Reviewed: Allergy & Precautions, H&P , NPO status , Patient's Chart, lab work & pertinent test results, reviewed documented beta blocker date and time   Airway Mallampati: II  TM Distance: >3 FB Neck ROM: full    Dental no notable dental hx.    Pulmonary sleep apnea    Pulmonary exam normal breath sounds clear to auscultation       Cardiovascular Exercise Tolerance: Good hypertension,  Rhythm:regular Rate:Normal     Neuro/Psych  PSYCHIATRIC DISORDERS Anxiety Depression    negative neurological ROS     GI/Hepatic Neg liver ROS,GERD  ,,  Endo/Other  diabetes    Renal/GU negative Renal ROS  negative genitourinary   Musculoskeletal   Abdominal   Peds  Hematology  (+) Blood dyscrasia, anemia   Anesthesia Other Findings   Reproductive/Obstetrics negative OB ROS                              Anesthesia Physical Anesthesia Plan  ASA: 2  Anesthesia Plan: General and General ETT   Post-op Pain Management:    Induction:   PONV Risk Score and Plan: Ondansetron  and Scopolamine patch - Pre-op  Airway Management Planned:   Additional Equipment:   Intra-op Plan:   Post-operative Plan:   Informed Consent: I have reviewed the patients History and Physical, chart, labs and discussed the procedure including the risks, benefits and alternatives for the proposed anesthesia with the patient or authorized representative who has indicated his/her understanding and acceptance.     Dental Advisory Given  Plan Discussed with: CRNA  Anesthesia Plan Comments:         Anesthesia Quick Evaluation

## 2024-09-06 NOTE — Anesthesia Procedure Notes (Signed)
 Procedure Name: Intubation Date/Time: 09/06/2024 7:42 AM  Performed by: Cordella Elvie HERO, CRNAPre-anesthesia Checklist: Patient identified, Emergency Drugs available, Suction available, Patient being monitored and Timeout performed Patient Re-evaluated:Patient Re-evaluated prior to induction Oxygen Delivery Method: Circle system utilized Preoxygenation: Pre-oxygenation with 100% oxygen Induction Type: IV induction Ventilation: Mask ventilation without difficulty Laryngoscope Size: Mac and 3 Grade View: Grade I Tube type: Oral Tube size: 7.0 mm Number of attempts: 1 Airway Equipment and Method: Stylet Placement Confirmation: ETT inserted through vocal cords under direct vision, positive ETCO2, CO2 detector and breath sounds checked- equal and bilateral Secured at: 22 cm Tube secured with: Tape Dental Injury: Teeth and Oropharynx as per pre-operative assessment

## 2024-09-06 NOTE — Op Note (Signed)
 Preoperative diagnosis: Enlarged fibroid uterus Menometrorrhagia Dysmenorrhea Iron  deficiency anemia due to history of menorrhagie(improved with megestrol  management)   Postoperative diagnosis: SAA + uterus >250 grams I believe   Procedure: dV5 Robotic hysterectomy with bilateral(opportunistic) salpingectomy  Laparoscopic guided transversus abdominus plane block for post op pain management  Surgeon: Vonn VEAR Inch, MD   Anesthesia: General endotracheal   Findings: Enlarged fibroid uterus, >250 grams   Description of operation: Patient was taken to the operating room and placed in the low lithotomy position She was prepped and draped in the usual sterile fashion robotic assisted laparoscopic procedure A Foley catheter was placed The vagina was once again prepped additionally   A RUMI II 10 cm with 4 cervical cup was placed for uterine manipulation and colpotomy delineation   An incision was made above the umbilicus The umbilical fascia was grasped A varies needle was used and placed into the peritoneum with 1 pass A pneumoperitoneum was created to a pressure of 15   An 8 mm port was placed into the peritoneal cavity using a nonbladed trocar easily with 1 pass using the video laparoscope The peritoneal cavity was confirmed   There were no unusual findings    3 additional 8 mm ports were placed at approximately the same level as the supraumbilical port 2 of the ports were robotic and 1 is an assist port   One was left lateral and 1 was placed right lateral, both lateral to the rectus anterior muscle These were placed under direct visualization without difficulty into the peritoneal cavity Nonbladed trocars were used in all instances    The patient was placed in 24 degrees of Trendelenburg   The da Vinci dV5 robot was then docked to the 3 robotic ports   Instruments used during the robotic hysterectomy: Vessel sealer extender using bipolar energy ProGrasp forceps  with no energy Monopolar hook Megacut needle driver 2-0 PDS symmetrical STRATAFIX on a CT 2 needle   I then left the patient and went to the surgical console   The RUMI II  was used throughout the case to apply traction and anterior/posterior displacement of the uterus to facilitate the robotic procedure The ProGrasp was used to put traction on the left adnexa The left ureter was identified and found to be well away from the adnexal vessels The vessel sealer extender using bipolar energy was used and the utero-ovarian ligament was coagulated and then transected I used traction medially and anteriorly and used the vessel sealer to take the broad ligament and round ligament down to the level of the cervical isthmus just lateral to the left uterine vessels   I then turned my attention to the right adnexa The pro grasp was used and medial and upward traction was placed The vessel sealer extender using bipolar energy was used to coagulate and ligate the right infundibulopelvic ligament vessels Again the right ureter was well inferior to the vessels I continued use medial and anterior retraction using the ProGrasp and the vessel sealer with the bipolar energy was used to take the broad ligament and round ligament down to the level of the cervical isthmus and lateral to the uterine vessels   I then placed the monopolar hook in the place of the ProGrasp The vessel sealer extender was used to grasp the peritoneum of the bladder provide traction, of course no energy was used for this I used the monopolar hook with energy to open of the peritoneal leaf anteriorly and dissect the bladder peritoneum  off the lower uterine segment and cervix beyond the level of the vaginal colpotomy cup I then used the monopolar hook to take the peritoneum down where I stopped using the vessel sealer and met in the bladder dissection bilaterally   The vessel sealer extender with monopolar energy was used to coagulate the  uterine vessels at the level of the cervical isthmus bilaterally and then just above and just below to manage backbleeding The uterine vessels were transected There was good hemostasis I then stayed medial to this uterine vessel pedicle with the vessel sealer extender and took down the paracervical tissue to allow colpotomy incision using the monopolar hook, preserving the cardinal ligament Again there was good hemostasis bilaterally   I then used the monopolar hook and made a posterior colpotomy incision above the level of insertion of the uterosacral ligaments I used a frowny face then smiley face technique and opened the vagina to approximately 8:00 and 4:00 posteriorly I then used the vessel sealer extender with bipolar energy, coagulating and then transecting the vagina   The monopolar hook was used to make the anterior colpotomy incision as the bladder had been dissected well past the colpotomy ring Cephalad tension was placed on the Vcare handle throughout this portion of the procedure to prevent thermal injury to the bladder and safe distance from the ureters bilaterally Colpotomy incision was made from 10:00 to 2:00 The vessel sealer with bipolar energy was then used to coagulate and transect the vagina, again inside of the cardinal ligament   The uterus was removed through the vagina, it was morcellated to facilitate removal   The tubes were also removed through the vagina A wet lap tape was placed in the vagina to maintain pneumoperitoneum   All pedicles were found to be hemostatic there was very little blood loss I then removed the vessel sealer and monopolar hook The pro grasp was replaced and the needle driver was also placed along with the suture   The vagina was closed using the 2-0 PDS STRATAFIX symmetrical suture on the CT 2 needle I pay close attention to the vaginal corners bilaterally and incorporated those in the closure 1.5 cm of vagina was operating to the vaginal  closure I also fixed the uterosacral ligaments to the vaginal cuff repair shortening the uterosacral ligaments thus providing improved vaginal vault suspension The cardinal ligaments were intact again improving postoperative vaginal support Pubocervical rectovaginal and posterior peritoneum were all included in the vaginal closure There was good result hemostasis   At the end of the procedure all the pedicles were identified and found to be hemostatic Both ureters were identified and undergoing normal peristalsis, they were well out of the way of surgical field throughout   I then got up from the console and went back to the side of the patient after gowning and gloving sterilely  I placed a  transversus abdominus plane block was placed using laparoscopic guidance at T10 and T7 bilaterally, 10 cc of 0.25% bupivacaine at each site, 40 cc total of 100 mg of bupivacaine.  Doyle's bubble technique was used. This was placed for post operative pain management.       The 4 robotic ports were removed The insufflation ports were used to actively desufflate the peritoneal cavity to a pressure of 0 Ports were then removed   The subcutaneous tissue of all 4 incisions was closed using 0 Vicryl All 4 skin incisions were closed using 3-0 vicryl in a subcuticular manner Dermabond was  used all 4 incision sites  Each incision was dressed   The patient tolerated the procedure well She received 2 g of Ancef and 30 mg of Toradol  preoperatively   EBL: 250 cc (back bleeding, it was already in the organ)   Vonn VEAR Inch, MD 09/06/2024 11:30 AM

## 2024-09-06 NOTE — Progress Notes (Signed)
 Patient attempted to use bedpan but was unable to urinate.

## 2024-09-07 ENCOUNTER — Encounter (HOSPITAL_COMMUNITY): Payer: Self-pay | Admitting: Obstetrics & Gynecology

## 2024-09-07 LAB — SURGICAL PATHOLOGY

## 2024-09-07 NOTE — Progress Notes (Signed)
 Pt questioned whether she should continue her iron  pill that had initially been started for anemia d/t heavy bleeding.  She didn't see it listed in her d/c meds.  Reviewed her dc instructions, but didn't see it mentioned.  Instructed her to call Dr. Genie office to find out whether to stop or continue her iron . Pt verbalized understanding.

## 2024-09-11 NOTE — Anesthesia Postprocedure Evaluation (Signed)
 Anesthesia Post Note  Patient: Gwyn BIRCH Pavia  Procedure(s) Performed: HYSTERECTOMY, TOTAL, BILATERAL SALPINGECTOMY, ROBOT ASSISTED, LAPAROSCOPIC, D5 (Bilateral: Abdomen)  Patient location during evaluation: Phase II Anesthesia Type: General Level of consciousness: awake Pain management: pain level controlled Vital Signs Assessment: post-procedure vital signs reviewed and stable Respiratory status: spontaneous breathing and respiratory function stable Cardiovascular status: blood pressure returned to baseline and stable Postop Assessment: no headache and no apparent nausea or vomiting Anesthetic complications: no Comments: Late entry   No notable events documented.   Last Vitals:  Vitals:   09/06/24 1254 09/06/24 1310  BP: (!) 120/107 (!) 151/90  Pulse: 67   Resp: 20   Temp: 36.8 C   SpO2: 93%     Last Pain:  Vitals:   09/07/24 1357  TempSrc:   PainSc: 1                  Yvonna JINNY Bosworth

## 2024-09-15 ENCOUNTER — Encounter: Payer: Self-pay | Admitting: Obstetrics & Gynecology

## 2024-09-15 ENCOUNTER — Ambulatory Visit: Admitting: Obstetrics & Gynecology

## 2024-09-15 VITALS — BP 127/84 | HR 82

## 2024-09-15 DIAGNOSIS — Z9889 Other specified postprocedural states: Secondary | ICD-10-CM

## 2024-09-15 NOTE — Progress Notes (Signed)
  HPI: Patient returns for routine postoperative follow-up having undergone   (Z98.890) Post-operative state: RA TLH + BS 09/06/24  (primary encounter diagnosis)    The patient's immediate postoperative recovery has been unremarkable. Since hospital discharge the patient reports had some constipation now resolved.   Current Outpatient Medications: BD PEN NEEDLE MICRO U/F 32G X 6 MM MISC, USE WITH LANTUS  INSULIN  ONCE DAILY, Disp: 100 each, Rfl: 2 Blood Glucose Monitoring Suppl DEVI, 1 each by Other route in the morning, at noon, and at bedtime. May substitute to any manufacturer covered by patient's insurance., Disp: 1 each, Rfl: 1 Continuous Glucose Sensor (DEXCOM G7 SENSOR) MISC, 1 each by Does not apply route every 14 (fourteen) days., Disp: 2 each, Rfl: 5 Glucose Blood (BLOOD GLUCOSE TEST STRIPS) STRP, Use QID as directed to check blood sugar, Disp: 200 strip, Rfl: 5 Glucose Blood (BLOOD GLUCOSE TEST STRIPS) STRP, 1 each by Other route in the morning, at noon, and at bedtime. May substitute to any manufacturer covered by patient's insurance., Disp: 100 each, Rfl: 3 Lancets Thin MISC, Use QID to test glucose, Disp: 200 each, Rfl: 5 LANTUS  SOLOSTAR 100 UNIT/ML Solostar Pen, ADMINISTER 18 UNITS UNDER THE SKIN DAILY, Disp: 15 mL, Rfl: 1 lisinopril  (ZESTRIL ) 20 MG tablet, Take 1 tablet (20 mg total) by mouth daily., Disp: 90 tablet, Rfl: 3 metFORMIN  (GLUCOPHAGE ) 1000 MG tablet, Take 1 tablet (1,000 mg total) by mouth 2 (two) times daily with a meal., Disp: 180 tablet, Rfl: 3 ciprofloxacin (CIPRO) 500 MG tablet, Take 1 tablet (500 mg total) by mouth 2 (two) times daily. (Patient not taking: Reported on 09/15/2024), Disp: 14 tablet, Rfl: 0 ketorolac  (TORADOL ) 10 MG tablet, Take 1 tablet (10 mg total) by mouth every 8 (eight) hours as needed. (Patient not taking: Reported on 09/15/2024), Disp: 15 tablet, Rfl: 0 ondansetron  (ZOFRAN -ODT) 8 MG disintegrating tablet, Take 1 tablet (8 mg total) by mouth  every 8 (eight) hours as needed for nausea or vomiting. (Patient not taking: Reported on 09/15/2024), Disp: 8 tablet, Rfl: 0 oxyCODONE -acetaminophen  (PERCOCET) 7.5-325 MG tablet, Take 1 tablet by mouth every 6 (six) hours as needed. (Patient not taking: Reported on 09/15/2024), Disp: 28 tablet, Rfl: 0  No current facility-administered medications for this visit.    Blood pressure 127/84, pulse 82.  Physical Exam: 4 incisions all look good \ Abdomen is benign  Diagnostic Tests:   Pathology: benign  Impression + Management plan: (S01.109) Post-operative state: RA TLH + BS 09/06/24  (primary encounter diagnosis)      Medications Prescribed this encounter: No orders of the defined types were placed in this encounter.     Follow up: Return in about 6 weeks (around 10/27/2024) for Follow up, with Dr Jayne.    Vonn VEAR Jayne, MD Attending Physician for the Center for [facility] and [facility] 09/15/2024 11:06 AM

## 2024-09-25 ENCOUNTER — Ambulatory Visit (INDEPENDENT_AMBULATORY_CARE_PROVIDER_SITE_OTHER): Payer: Self-pay | Admitting: Family Medicine

## 2024-09-25 DIAGNOSIS — E119 Type 2 diabetes mellitus without complications: Secondary | ICD-10-CM | POA: Diagnosis not present

## 2024-09-25 DIAGNOSIS — I10 Essential (primary) hypertension: Secondary | ICD-10-CM

## 2024-09-25 DIAGNOSIS — D500 Iron deficiency anemia secondary to blood loss (chronic): Secondary | ICD-10-CM | POA: Diagnosis not present

## 2024-09-25 DIAGNOSIS — Z7984 Long term (current) use of oral hypoglycemic drugs: Secondary | ICD-10-CM | POA: Diagnosis not present

## 2024-09-25 MED ORDER — METFORMIN HCL 1000 MG PO TABS: 1000.0000 mg | ORAL_TABLET | Freq: Two times a day (BID) | ORAL | 3 refills | Status: AC

## 2024-09-25 NOTE — Assessment & Plan Note (Addendum)
 A1c at goal.  Advised to monitor sugars via Dexcom. Continue Lantus  and Metformin .

## 2024-09-25 NOTE — Patient Instructions (Signed)
Follow up in 3 months.  Take care  Dr. Cook  

## 2024-09-25 NOTE — Assessment & Plan Note (Signed)
 Last ferritin as 18. Stores should continue to improve s/p hysterectomy. Will recheck in 3 months.

## 2024-09-25 NOTE — Progress Notes (Signed)
 Subjective:  Patient ID: Debra Tanner, female    DOB: [birth year]  Age: 43 y.o. MRN: [id number]  CC:   Chief Complaint  Patient presents with   Follow-up    Patient is here for a follow up. Patient wants to discuss a medication she was prescribed too but was discontinued because of surgery she had in October.  Patient also mentioned she was confused on how to use the blood glucose strips. Mentioned having demonstration or alternative.      HPI:  43 year old female presents for follow up.  Doing well following hysterectomy. Iron  has been stopped. She has questions about this today.  Has questions about Dexcom. Has not started using.  BP decently controlled on Lisinopril .    Patient Active Problem List   Diagnosis Date Noted   Iron  deficiency anemia due to chronic blood loss 08/28/2022   Hypertension 11/07/2019   Diabetes mellitus without complication (HCC) 03/05/2017   GAD (generalized anxiety disorder) 03/05/2017   History of learning disability 03/05/2017   Obesity (BMI 30-39.9) 03/05/2017    Social Hx   Social History   Socioeconomic History   Marital status: Single    Spouse name: Not on file   Number of children: 1   Years of education: 12   Highest education level: Not on file  Occupational History   Occupation: disabled    Comment: Mental  Tobacco Use   Smoking status: Never    Passive exposure: Current   Smokeless tobacco: Never  Vaping Use   Vaping status: Never Used  Substance and Sexual Activity   Alcohol use: Not Currently   Drug use: Not Currently    Types: Marijuana   Sexual activity: Not Currently    Birth control/protection: Condom, Pill  Other Topics Concern   Not on file  Social History Narrative   Raised by grandmother   Lives with grandmother and son   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   Social Drivers of Health   Financial Resource Strain: Low Risk  (05/14/2022)   Overall Financial Resource Strain  (CARDIA)    Difficulty of Paying Living Expenses: Not hard at all  Food Insecurity: Food Insecurity Present (05/14/2022)   Hunger Vital Sign    Worried About Running Out of Food in the Last Year: Sometimes true    Ran Out of Food in the Last Year: Sometimes true  Transportation Needs: No Transportation Needs (05/14/2022)   PRAPARE - Administrator, Civil Service (Medical): No    Lack of Transportation (Non-Medical): No  Physical Activity: Patient Declined (05/14/2022)   Exercise Vital Sign    Days of Exercise per Week: Patient declined    Minutes of Exercise per Session: Patient declined  Stress: Stress Concern Present (05/14/2022)   [employer] of Occupational Health - Occupational Stress Questionnaire    Feeling of Stress : To some extent  Social Connections: Unknown (05/14/2022)   Social Connection and Isolation Panel    Frequency of Communication with Friends and Family: Three times a week    Frequency of Social Gatherings with Friends and Family: Three times a week    Attends Religious Services: 1 to 4 times per year    Active Member of Clubs or Organizations: No    Attends Banker Meetings: Never    Marital Status: Patient declined    Review of Systems Per HPI  Objective:  BP 137/79 (BP Location: Left Arm, Patient Position: Sitting)  Pulse 76   Temp 98.2 F (36.8 C)   Ht 4' 9 (1.448 m)   Wt 158 lb 8 oz (71.9 kg)   LMP  (LMP Unknown)   BMI 34.30 kg/m      09/25/2024    2:30 PM 09/15/2024   11:00 AM 09/06/2024    1:10 PM  BP/Weight  Systolic BP 137 127 151  Diastolic BP 79 84 90  Wt. (Lbs) 158.5    BMI 34.3 kg/m2      Physical Exam Constitutional:      General: She is not in acute distress.    Appearance: Normal appearance.  HENT:     Head: Normocephalic and atraumatic.  Cardiovascular:     Rate and Rhythm: Normal rate and regular rhythm.  Pulmonary:     Effort: Pulmonary effort is normal.     Breath sounds: Normal breath  sounds.  Neurological:     Mental Status: She is alert.  Psychiatric:        Mood and Affect: Mood normal.        Behavior: Behavior normal.     Lab Results  Component Value Date   WBC 7.3 09/04/2024   HGB 10.7 (L) 09/04/2024   HCT 33.7 (L) 09/04/2024   PLT 352 09/04/2024   GLUCOSE 166 (H) 09/04/2024   CHOL 120 08/23/2024   TRIG 96 08/23/2024   HDL 36 (L) 08/23/2024   LDLCALC 66 08/23/2024   ALT 13 09/04/2024   AST 13 (L) 09/04/2024   NA 138 09/04/2024   K 4.0 09/04/2024   CL 104 09/04/2024   CREATININE 0.59 09/04/2024   BUN 9 09/04/2024   CO2 21 (L) 09/04/2024   TSH 1.46 05/08/2021   HGBA1C 6.5 (H) 08/23/2024   MICROALBUR 0.7 06/24/2020     Assessment & Plan:  Iron  deficiency anemia due to chronic blood loss Assessment & Plan: Last ferritin as 18. Stores should continue to improve s/p hysterectomy. Will recheck in 3 months.   Diabetes mellitus without complication (HCC) Assessment & Plan: A1c at goal.  Advised to monitor sugars via Dexcom. Continue Lantus  and Metformin .  Orders: -     metFORMIN  HCl; Take 1 tablet (1,000 mg total) by mouth 2 (two) times daily with a meal.  Dispense: 180 tablet; Refill: 3  Hypertension, unspecified type Assessment & Plan: Fair control. Continue lisinopril .     Follow-up:  3 months  Ahyana Skillin Bluford DO [facility]

## 2024-09-25 NOTE — Assessment & Plan Note (Signed)
Fair control.  Continue lisinopril.

## 2024-10-30 ENCOUNTER — Encounter: Admitting: Obstetrics & Gynecology

## 2024-12-14 ENCOUNTER — Ambulatory Visit: Admitting: Obstetrics & Gynecology

## 2024-12-14 ENCOUNTER — Encounter: Payer: Self-pay | Admitting: Obstetrics & Gynecology

## 2024-12-14 VITALS — BP 170/103 | HR 66 | Ht <= 58 in | Wt 154.8 lb

## 2024-12-14 DIAGNOSIS — Z48816 Encounter for surgical aftercare following surgery on the genitourinary system: Secondary | ICD-10-CM

## 2024-12-14 DIAGNOSIS — Z9889 Other specified postprocedural states: Secondary | ICD-10-CM

## 2024-12-14 NOTE — Progress Notes (Signed)
" °  HPI: Patient returns for routine postoperative follow-up having undergone   (Z98.890) Post-operative state: RA TLH + BS 09/06/24  (primary encounter diagnosis)    The patient's immediate postoperative recovery has been unremarkable. Since hospital discharge the patient reports had some constipation now resolved.   Current Outpatient Medications: BD PEN NEEDLE MICRO U/F 32G X 6 MM MISC, USE WITH LANTUS  INSULIN  ONCE DAILY, Disp: 100 each, Rfl: 2 Blood Glucose Monitoring Suppl DEVI, 1 each by Other route in the morning, at noon, and at bedtime. May substitute to any manufacturer covered by patient's insurance., Disp: 1 each, Rfl: 1 Continuous Glucose Sensor (DEXCOM G7 SENSOR) MISC, 1 each by Does not apply route every 14 (fourteen) days., Disp: 2 each, Rfl: 5 Glucose Blood (BLOOD GLUCOSE TEST STRIPS) STRP, Use QID as directed to check blood sugar, Disp: 200 strip, Rfl: 5 Glucose Blood (BLOOD GLUCOSE TEST STRIPS) STRP, 1 each by Other route in the morning, at noon, and at bedtime. May substitute to any manufacturer covered by patient's insurance., Disp: 100 each, Rfl: 3 Lancets Thin MISC, Use QID to test glucose, Disp: 200 each, Rfl: 5 LANTUS  SOLOSTAR 100 UNIT/ML Solostar Pen, ADMINISTER 18 UNITS UNDER THE SKIN DAILY, Disp: 15 mL, Rfl: 1 lisinopril  (ZESTRIL ) 20 MG tablet, Take 1 tablet (20 mg total) by mouth daily., Disp: 90 tablet, Rfl: 3 metFORMIN  (GLUCOPHAGE ) 1000 MG tablet, Take 1 tablet (1,000 mg total) by mouth 2 (two) times daily with a meal., Disp: 180 tablet, Rfl: 3 ciprofloxacin  (CIPRO ) 500 MG tablet, Take 1 tablet (500 mg total) by mouth 2 (two) times daily. (Patient not taking: Reported on 09/15/2024), Disp: 14 tablet, Rfl: 0 ketorolac  (TORADOL ) 10 MG tablet, Take 1 tablet (10 mg total) by mouth every 8 (eight) hours as needed. (Patient not taking: Reported on 09/15/2024), Disp: 15 tablet, Rfl: 0 ondansetron  (ZOFRAN -ODT) 8 MG disintegrating tablet, Take 1 tablet (8 mg total) by mouth  every 8 (eight) hours as needed for nausea or vomiting. (Patient not taking: Reported on 09/15/2024), Disp: 8 tablet, Rfl: 0 oxyCODONE -acetaminophen  (PERCOCET) 7.5-325 MG tablet, Take 1 tablet by mouth every 6 (six) hours as needed. (Patient not taking: Reported on 09/15/2024), Disp: 28 tablet, Rfl: 0  No current facility-administered medications for this visit.    Blood pressure 127/84, pulse 82.  Physical Exam: 4 incisions all look good \ Abdomen is benign Cuff is intact non tender  Diagnostic Tests:   Pathology: benign  Impression + Management plan: (S01.109) Post-operative state: RA TLH + BS 09/06/24  (primary encounter diagnosis)      Medications Prescribed this encounter: No orders of the defined types were placed in this encounter.     Follow up: Return if symptoms worsen or fail to improve.    Vonn VEAR Inch, MD Attending Physician for the Center for [facility] and [facility] 09/15/2024 11:06 AM     "

## 2024-12-21 ENCOUNTER — Telehealth: Payer: Self-pay | Admitting: *Deleted

## 2024-12-21 DIAGNOSIS — E119 Type 2 diabetes mellitus without complications: Secondary | ICD-10-CM

## 2024-12-21 DIAGNOSIS — I10 Essential (primary) hypertension: Secondary | ICD-10-CM

## 2024-12-21 NOTE — Progress Notes (Signed)
 Complex Care Management Note  Care Guide Note 12/21/2024 Name: DALANIE KISNER MRN: [id number] DOB: Jul 31, 1981  Gwyn JONETTA Chesbro is a 44 y.o. year old female who sees Malaga, Jayce G, DO for primary care. I reached out to Gwyn JONETTA Austin by phone today to offer complex care management services.  Ms. Rake was given information about Complex Care Management services today including:   The Complex Care Management services include support from the care team which includes your Nurse Care Manager, Clinical Social Worker, or Pharmacist.  The Complex Care Management team is here to help remove barriers to the health concerns and goals most important to you. Complex Care Management services are voluntary, and the patient may decline or stop services at any time by request to their care team member.   Complex Care Management Consent Status: Patient agreed to services and verbal consent obtained.   Follow up plan:  Telephone appointment with complex care management team member scheduled for:  12/27/24  Encounter Outcome:  Patient Scheduled  Harlene Satterfield  [facility]  [facility], [facility] Guide  Direct Dial: [phone]  Fax [phone]

## 2024-12-22 ENCOUNTER — Telehealth: Payer: Self-pay

## 2024-12-22 NOTE — Progress Notes (Unsigned)
 Complex Care Management Care Guide Note  12/22/2024 Name: Debra Tanner MRN: [id number] DOB: Dec 01, 1980  Debra Tanner is a 44 y.o. year old female who is a primary care patient of Cook, Jayce G, DO and is actively engaged with the care management team. I reached out to Debra Tanner by phone today to assist with re-scheduling  with the RN Case Manager.  Follow up plan: Unsuccessful telephone outreach attempt made. A HIPAA compliant phone message was left for the patient providing contact information and requesting a return call.  Jeoffrey Buffalo , RMA     [facility]  [facility], [facility] Guide  Direct Dial: [phone]  Website: [email or web address]

## 2024-12-25 ENCOUNTER — Ambulatory Visit: Admitting: Family Medicine

## 2024-12-26 NOTE — Progress Notes (Signed)
 Complex Care Management Care Guide Note  12/26/2024 Name: Debra Tanner MRN: [id number] DOB: [birth year]  Debra Tanner is a 44 y.o. year old female who is a primary care patient of Cook, Jayce G, DO and is actively engaged with the care management team. I reached out to Debra Tanner by phone today to assist with re-scheduling  with the RN Case Manager.  Follow up plan: Telephone appointment with complex care management team member scheduled for:  01/11/2025  Jeoffrey Buffalo , RMA     [place]  [facility], [facility] Guide  Direct Dial: [phone]  Website: [email or web address]

## 2024-12-27 ENCOUNTER — Telehealth: Admitting: *Deleted

## 2025-01-11 ENCOUNTER — Telehealth: Admitting: *Deleted
